# Patient Record
Sex: Male | Born: 1965
Health system: Southern US, Community
[De-identification: ages and names within clinical notes are randomized; demographics above are authoritative.]

## PROBLEM LIST (undated history)

## (undated) DIAGNOSIS — G4730 Sleep apnea, unspecified: Secondary | ICD-10-CM

## (undated) DIAGNOSIS — I509 Heart failure, unspecified: Secondary | ICD-10-CM

## (undated) DIAGNOSIS — I428 Other cardiomyopathies: Secondary | ICD-10-CM

## (undated) DIAGNOSIS — M549 Dorsalgia, unspecified: Secondary | ICD-10-CM

## (undated) DIAGNOSIS — E669 Obesity, unspecified: Secondary | ICD-10-CM

## (undated) DIAGNOSIS — I10 Essential (primary) hypertension: Secondary | ICD-10-CM

## (undated) DIAGNOSIS — G8929 Other chronic pain: Secondary | ICD-10-CM

## (undated) DIAGNOSIS — R0600 Dyspnea, unspecified: Secondary | ICD-10-CM

## (undated) DIAGNOSIS — I480 Paroxysmal atrial fibrillation: Secondary | ICD-10-CM

---

## 2003-07-23 ENCOUNTER — Encounter: Payer: Self-pay | Admitting: *Deleted

## 2003-07-23 ENCOUNTER — Ambulatory Visit (HOSPITAL_COMMUNITY): Admission: RE | Admit: 2003-07-23 | Discharge: 2003-07-23 | Payer: Self-pay | Admitting: *Deleted

## 2004-03-15 ENCOUNTER — Emergency Department (HOSPITAL_COMMUNITY): Admission: EM | Admit: 2004-03-15 | Discharge: 2004-03-15 | Payer: Self-pay | Admitting: Emergency Medicine

## 2004-12-14 ENCOUNTER — Emergency Department (HOSPITAL_COMMUNITY): Admission: EM | Admit: 2004-12-14 | Discharge: 2004-12-15 | Payer: Self-pay | Admitting: Emergency Medicine

## 2005-04-05 ENCOUNTER — Inpatient Hospital Stay (HOSPITAL_COMMUNITY): Admission: AD | Admit: 2005-04-05 | Discharge: 2005-04-06 | Payer: Self-pay | Admitting: *Deleted

## 2005-05-03 ENCOUNTER — Ambulatory Visit (HOSPITAL_BASED_OUTPATIENT_CLINIC_OR_DEPARTMENT_OTHER): Admission: RE | Admit: 2005-05-03 | Discharge: 2005-05-03 | Payer: Self-pay | Admitting: *Deleted

## 2005-05-03 ENCOUNTER — Ambulatory Visit: Payer: Self-pay | Admitting: Internal Medicine

## 2005-08-28 ENCOUNTER — Emergency Department (HOSPITAL_COMMUNITY): Admission: EM | Admit: 2005-08-28 | Discharge: 2005-08-29 | Payer: Self-pay | Admitting: Emergency Medicine

## 2005-11-06 ENCOUNTER — Emergency Department (HOSPITAL_COMMUNITY): Admission: EM | Admit: 2005-11-06 | Discharge: 2005-11-06 | Payer: Self-pay | Admitting: Emergency Medicine

## 2006-02-05 ENCOUNTER — Ambulatory Visit (HOSPITAL_COMMUNITY): Admission: RE | Admit: 2006-02-05 | Discharge: 2006-02-06 | Payer: Self-pay | Admitting: Orthopedic Surgery

## 2006-09-30 ENCOUNTER — Emergency Department (HOSPITAL_COMMUNITY): Admission: EM | Admit: 2006-09-30 | Discharge: 2006-10-01 | Payer: Self-pay | Admitting: Emergency Medicine

## 2006-11-19 HISTORY — PX: SHOULDER ARTHROSCOPY WITH LABRAL REPAIR: SHX5691

## 2007-05-22 ENCOUNTER — Emergency Department (HOSPITAL_COMMUNITY): Admission: EM | Admit: 2007-05-22 | Discharge: 2007-05-23 | Payer: Self-pay | Admitting: Emergency Medicine

## 2007-08-20 ENCOUNTER — Emergency Department (HOSPITAL_COMMUNITY): Admission: EM | Admit: 2007-08-20 | Discharge: 2007-08-20 | Payer: Self-pay | Admitting: Emergency Medicine

## 2009-01-21 ENCOUNTER — Inpatient Hospital Stay (HOSPITAL_COMMUNITY): Admission: EM | Admit: 2009-01-21 | Discharge: 2009-01-23 | Payer: Self-pay | Admitting: Family Medicine

## 2009-01-22 ENCOUNTER — Encounter (INDEPENDENT_AMBULATORY_CARE_PROVIDER_SITE_OTHER): Payer: Self-pay | Admitting: Internal Medicine

## 2009-08-18 ENCOUNTER — Inpatient Hospital Stay (HOSPITAL_COMMUNITY): Admission: EM | Admit: 2009-08-18 | Discharge: 2009-08-20 | Payer: Self-pay | Admitting: Emergency Medicine

## 2009-08-19 ENCOUNTER — Encounter (INDEPENDENT_AMBULATORY_CARE_PROVIDER_SITE_OTHER): Payer: Self-pay | Admitting: Internal Medicine

## 2009-09-12 ENCOUNTER — Ambulatory Visit: Payer: Self-pay | Admitting: *Deleted

## 2009-09-28 ENCOUNTER — Ambulatory Visit: Payer: Self-pay | Admitting: Internal Medicine

## 2010-02-16 ENCOUNTER — Ambulatory Visit: Payer: Self-pay | Admitting: Internal Medicine

## 2010-02-16 LAB — CONVERTED CEMR LAB
ALT: 27 units/L (ref 0–53)
Alkaline Phosphatase: 40 units/L (ref 39–117)
Basophils Absolute: 0 10*3/uL (ref 0.0–0.1)
Basophils Relative: 1 % (ref 0–1)
CO2: 23 meq/L (ref 19–32)
LDL Cholesterol: 107 mg/dL — ABNORMAL HIGH (ref 0–99)
Lymphocytes Relative: 27 % (ref 12–46)
MCHC: 33.5 g/dL (ref 30.0–36.0)
Monocytes Relative: 11 % (ref 3–12)
Neutro Abs: 3.5 10*3/uL (ref 1.7–7.7)
Neutrophils Relative %: 60 % (ref 43–77)
RBC: 5.04 M/uL (ref 4.22–5.81)
Sodium: 138 meq/L (ref 135–145)
Total Bilirubin: 0.7 mg/dL (ref 0.3–1.2)
Total Protein: 8 g/dL (ref 6.0–8.3)
VLDL: 57 mg/dL — ABNORMAL HIGH (ref 0–40)

## 2010-03-23 ENCOUNTER — Ambulatory Visit: Payer: Self-pay | Admitting: Internal Medicine

## 2011-02-22 LAB — LIPID PANEL
HDL: 42 mg/dL (ref >39)
Triglycerides: 164 mg/dL — ABNORMAL HIGH (ref <150)
VLDL: 33 mg/dL (ref 0–40)

## 2011-02-22 LAB — CBC
HCT: 33 % — ABNORMAL LOW (ref 39.0–52.0)
Hemoglobin: 11.4 g/dL — ABNORMAL LOW (ref 13.0–17.0)
MCHC: 33.6 g/dL (ref 30.0–36.0)
MCV: 81.4 fL (ref 78.0–100.0)
MCV: 82.1 fL (ref 78.0–100.0)
Platelets: 158 10*3/uL (ref 150–400)
Platelets: 165 10*3/uL (ref 150–400)
RBC: 4.05 MIL/uL — ABNORMAL LOW (ref 4.22–5.81)
WBC: 5.9 10*3/uL (ref 4.0–10.5)

## 2011-02-22 LAB — COMPREHENSIVE METABOLIC PANEL
AST: 18 U/L (ref 0–37)
Albumin: 3.6 g/dL (ref 3.5–5.2)
Calcium: 9.3 mg/dL (ref 8.4–10.5)
Creatinine, Ser: 1.23 mg/dL (ref 0.4–1.5)
GFR calc Af Amer: >60 mL/min (ref >60)
GFR calc non Af Amer: >60 mL/min (ref >60)

## 2011-02-22 LAB — CARDIAC PANEL(CRET KIN+CKTOT+MB+TROPI)
Relative Index: 1 (ref 0.0–2.5)
Total CK: 228 U/L (ref 7–232)
Troponin I: 0.02 ng/mL (ref 0.00–0.06)

## 2011-02-22 LAB — BASIC METABOLIC PANEL
BUN: 17 mg/dL (ref 6–23)
Chloride: 104 mEq/L (ref 96–112)
Potassium: 3.7 mEq/L (ref 3.5–5.1)
Sodium: 137 mEq/L (ref 135–145)

## 2011-02-22 LAB — BRAIN NATRIURETIC PEPTIDE: Pro B Natriuretic peptide (BNP): 154 pg/mL — ABNORMAL HIGH (ref 0.0–100.0)

## 2011-02-23 LAB — TROPONIN I: Troponin I: 0.03 ng/mL (ref 0.00–0.06)

## 2011-02-23 LAB — MAGNESIUM: Magnesium: 2.2 mg/dL (ref 1.5–2.5)

## 2011-02-23 LAB — URINE CULTURE

## 2011-02-23 LAB — RETICULOCYTES: Retic Ct Pct: 1.5 % (ref 0.4–3.1)

## 2011-02-23 LAB — T4, FREE: Free T4: 1.32 ng/dL (ref 0.80–1.80)

## 2011-02-23 LAB — CARDIAC PANEL(CRET KIN+CKTOT+MB+TROPI)
CK, MB: 2.4 ng/mL (ref 0.3–4.0)
Relative Index: 0.8 (ref 0.0–2.5)
Total CK: 289 U/L — ABNORMAL HIGH (ref 7–232)

## 2011-02-23 LAB — DIFFERENTIAL
Basophils Absolute: 0 10*3/uL (ref 0.0–0.1)
Lymphocytes Relative: 21 % (ref 12–46)
Neutro Abs: 5 10*3/uL (ref 1.7–7.7)

## 2011-02-23 LAB — CBC
Hemoglobin: 12.9 g/dL — ABNORMAL LOW (ref 13.0–17.0)
RBC: 4.63 MIL/uL (ref 4.22–5.81)

## 2011-02-23 LAB — HEMOGLOBIN A1C
Hgb A1c MFr Bld: 6.7 % — ABNORMAL HIGH (ref 4.6–6.1)
Mean Plasma Glucose: 146 mg/dL

## 2011-02-23 LAB — CK TOTAL AND CKMB (NOT AT ARMC)
CK, MB: 2.7 ng/mL (ref 0.3–4.0)
CK, MB: 3.4 ng/mL (ref 0.3–4.0)
Relative Index: 0.9 (ref 0.0–2.5)
Total CK: 303 U/L — ABNORMAL HIGH (ref 7–232)

## 2011-02-23 LAB — BASIC METABOLIC PANEL
CO2: 22 mEq/L (ref 19–32)
Calcium: 9.3 mg/dL (ref 8.4–10.5)
GFR calc Af Amer: >60 mL/min (ref >60)
GFR calc non Af Amer: >60 mL/min (ref >60)
Sodium: 137 mEq/L (ref 135–145)

## 2011-02-23 LAB — RAPID URINE DRUG SCREEN, HOSP PERFORMED: Cocaine: NOT DETECTED

## 2011-02-23 LAB — URINALYSIS, ROUTINE W REFLEX MICROSCOPIC
Glucose, UA: NEGATIVE mg/dL
Hgb urine dipstick: NEGATIVE
Ketones, ur: NEGATIVE mg/dL
Protein, ur: NEGATIVE mg/dL

## 2011-02-23 LAB — PROTIME-INR: Prothrombin Time: 13.5 seconds (ref 11.6–15.2)

## 2011-02-23 LAB — IRON AND TIBC: Saturation Ratios: 21 % (ref 20–55)

## 2011-02-23 LAB — VITAMIN B12: Vitamin B-12: 385 pg/mL (ref 211–911)

## 2011-03-01 LAB — CBC
HCT: 33.5 % — ABNORMAL LOW (ref 39.0–52.0)
Hemoglobin: 11.5 g/dL — ABNORMAL LOW (ref 13.0–17.0)
MCHC: 34.3 g/dL (ref 30.0–36.0)
MCV: 80.4 fL (ref 78.0–100.0)
RBC: 4.17 MIL/uL — ABNORMAL LOW (ref 4.22–5.81)
WBC: 5.9 10*3/uL (ref 4.0–10.5)

## 2011-03-01 LAB — BASIC METABOLIC PANEL
CO2: 24 mEq/L (ref 19–32)
Calcium: 9 mg/dL (ref 8.4–10.5)
Chloride: 107 mEq/L (ref 96–112)
GFR calc Af Amer: >60 mL/min (ref >60)
GFR calc Af Amer: >60 mL/min (ref >60)
GFR calc non Af Amer: >60 mL/min (ref >60)
Potassium: 4.6 mEq/L (ref 3.5–5.1)
Sodium: 138 mEq/L (ref 135–145)
Sodium: 139 mEq/L (ref 135–145)

## 2011-03-01 LAB — CK TOTAL AND CKMB (NOT AT ARMC): Total CK: 173 U/L (ref 7–232)

## 2011-03-01 LAB — DIFFERENTIAL
Basophils Relative: 0 % (ref 0–1)
Eosinophils Absolute: 0.1 10*3/uL (ref 0.0–0.7)
Lymphs Abs: 1 10*3/uL (ref 0.7–4.0)
Monocytes Absolute: 0.4 10*3/uL (ref 0.1–1.0)
Monocytes Relative: 6 % (ref 3–12)

## 2011-03-01 LAB — CARDIAC PANEL(CRET KIN+CKTOT+MB+TROPI)
CK, MB: 1.5 ng/mL (ref 0.3–4.0)
CK, MB: 1.8 ng/mL (ref 0.3–4.0)
Total CK: 164 U/L (ref 7–232)

## 2011-03-01 LAB — TSH: TSH: 2.171 u[IU]/mL (ref 0.350–4.500)

## 2011-03-01 LAB — FERRITIN: Ferritin: 172 ng/mL (ref 22–322)

## 2011-03-01 LAB — IRON AND TIBC
Saturation Ratios: 14 % — ABNORMAL LOW (ref 20–55)
TIBC: 339 ug/dL (ref 215–435)

## 2011-03-01 LAB — D-DIMER, QUANTITATIVE: D-Dimer, Quant: 0.4 ug/mL-FEU (ref 0.00–0.48)

## 2011-03-01 LAB — TROPONIN I: Troponin I: 0.02 ng/mL (ref 0.00–0.06)

## 2011-04-03 NOTE — Discharge Summary (Signed)
NAME:  Juan Snyder, BLANK NO.:  [id number]   MEDICAL RECORD NO.:  [id number]          PATIENT TYPE:  INP   LOCATION:  4709                         FACILITY:  [facility]   PHYSICIAN:  Eduard Clos, MDDATE OF BIRTH:  1966-06-05   DATE OF ADMISSION:  01/21/2009  DATE OF DISCHARGE:                               DISCHARGE SUMMARY   COURSE IN THE HOSPITAL:  A 45 year old male with known history of  hypertension, hyperlipidemia, nonischemic cardiomyopathy presents with  complaints of shortness of breath.  On admission, the patient had a  chest x-ray which showed features of pulmonary edema.  The patient also  was found to be very hypertensive with systolic BPs  in the 180s and  diastolic more than 120.  The patient was admitted to telemetry floor  started on antihypertensives and serial cardiac enzymes were followed.  EKG did show some nonspecific T-wave inversions.  Cardiac enzymes were  negative.  The patient underwent stress test through nuclear medicine  which showed no evidence of any pharmacological stress-induced ischemia.  EF was 41% with mild left ventricular enlargement with lateral wall  hypokinesis.   The patient had a cardiology consult with Dr. Elsie Lincoln and eventually  followed by Dr. Jacinto Halim.  The patient's blood pressure were medications  were adjusted and advised to follow up with [facility] in a week's time.  At this time, the patient's symptoms are  largely improved.  Blood pressure is reasonably controlled.  I have  advised the patient to strongly be compliant with his medications and to  recheck his BMET and LFT within a week's time with his primary care  physician.  At the time of this dictation, the patient is  hemodynamically stable.   PROCEDURES DURING THE STAY:  1. Stress Myoview on January 22, 2009, showed negative pharmacological      stress-induced ischemia.  Left ventricular ejection fraction 41%.      Mild left ventricular  enlargement with lateral wall hypokinesis.  2. Chest x-ray on January 21, 2009, showed acute pulmonary interstitial      edema, mild progression of cardiomegaly.   PERTINENT LABS:  On January 23, 2009, BUN and creatinine were 15 and 1.2,  troponins were all negative at 0.01.  BNP on January 22, 2009, was 443.   FINAL DIAGNOSES:  1. Acute pulmonary edema secondary to accelerated hypertension.  2. Decompensated congestive heart failure both systolic and diastolic,      acute on chronic secondary to uncontrolled hypertension.  3. Accelerated hypertension secondary to noncompliance with      medication.  4. Hyperlipidemia.  5. Chronic back pain.  6. Obstructive sleep apnea.  7. Obesity.   MEDICATIONS AT DISCHARGE:  1. Multivitamin 1 tablet p.o. daily.  2. Ambien 5 mg p.o. at bedtime as needed.  3. Aspirin 81 mg p.o. daily.  4. Vicodin 5/500 mg p.o. q.6 h. p.r.n. for pain.  5. Flexeril 10 mg p.o. t.i.d.  6. Potassium chloride 20 mEq p.o. daily.  7. Lasix 40 mg p.o. daily.  8. Coreg 6.25 mg p.o. b.i.d.  9. Amlodipine  10 mg p.o. daily.  10.Terazosin 2 mg p.o. b.i.d.  11.Lisinopril and hydrochlorothiazide 40/25 mg p.o. daily.  12.Pravachol 40 mg p.o. daily.   PLAN:  The patient advised to follow up with his primary care physician  within a week's time to recheck his BMET and LFT, to follow up with  [facility] and Dr. Lynnea Ferrier in a week's time.  The  patient is to be on a cardiac-healthy diet with fluid restriction of 1.5  L.  The patient is strongly advised to be compliant with his medication.  Activity as tolerated.      Eduard Clos, MD  Electronically Signed     ANK/MEDQ  D:  01/23/2009  T:  01/24/2009  Job:  [id number]

## 2011-04-03 NOTE — H&P (Signed)
NAME:  Juan Snyder, Juan Snyder NO.:  [id number]   MEDICAL RECORD NO.:  [id number]          PATIENT TYPE:  INP   LOCATION:  4709                         FACILITY:  [facility]   PHYSICIAN:  Massie Maroon, MD        DATE OF BIRTH:  07/08/1966   DATE OF ADMISSION:  01/21/2009  DATE OF DISCHARGE:                              HISTORY & PHYSICAL   CHIEF COMPLAINT:  Increasing shortness of breath.   This is a 45 year old male with history of nonischemic cardiomyopathy  (EF 40-45%), complains of increasing shortness of breath over the past  week.  He also complains of some chest pain when he was back to the  house to use the restroom.  He does not state at what time it began.  The chest pain was substernal and described as pressure without  radiation and lasted a few minutes and occurred about 4-5 days ago.  He  has noticed about 10 pounds of weight gain over the past week.  He  describes some orthopnea.  The patient denies any fever, chills, cough,  palpitations, nausea, vomiting, diaphoresis.  He does note some  increasing lower extremity edema over the past week as well.  He denies  any increased salt or poorly controlled hypertension.  The patient was  brought to [facility] and then apparently sent to the ED.  In the ED, chest x-ray showed acute pulmonary interstitial edema with  mild increase in cardiomegaly.  The patient will be admitted for  congestive heart failure as well as chest pain.   PAST MEDICAL HISTORY:  1. Hypertension.  2. Metabolic syndrome.  3. Nonischemic cardiomyopathy, no prior history of CHF.  4. Left heart cath on July 23, 2003, no significant CAD, EF 40-45%  5. Sleep apnea with history of CPAP titration on May 03, 2005, at 18      CWP.  6. Chronic back pain.   PAST SURGICAL HISTORY:  1. Right shoulder SLAP lesion and impingement.  2. Right shoulder arthroscopy with extensive intra-articular      debridement of torn superior labral tear.  3. Anterior/posterior as well as arthroscopic biceps tenotomy.  4. Arthroscopic subacromial decompression with coracoacromial ligament      release followed by many open biceps tenodesis in the groove using      the Arthrex Bio-Tenodesis screw.   ALLERGIES:  No known drug allergies.   SOCIAL HISTORY:  The patient does not smoke.  He drinks about 6 pack of  beer per week, 10 shots per week.   FAMILY HISTORY:  Mother is alive at age 20, complains of hypertension,  hyperlipidemia, right shoulder surgery.  Father has hypertension and is  lives at 47.  He has a history of colitis.   REVIEW OF SYSTEMS:  No history of COPD, no history of asthma, negative  for all 10-organ systems except for pertinent positives as stated above.   PHYSICAL EXAMINATION:  VITAL SIGNS:  Temperature 99.8, pulse 70, blood  pressure 174/121, pulse ox was 99%.  HEENT:  Anicteric, EOMI, no nystagmus, pupils 1.5 mm, symmetric, direct,  consensual, near reflexes intact.  NECK:  No JVD, no bruit, no thyromegaly.  HEART:  Regular rate and rhythm.  S1 and S2.  No murmurs, gallops, or  rubs.  LUNGS:  Clear to auscultation bilaterally (this is after the patient  received Lasix IV in the ED).  ABDOMEN:  Soft, nontender, nondistended.  Positive bowel sounds.  EXTREMITIES:  No cyanosis, clubbing, or edema.  NEURO:  Cranial nerves II through XII intact, reflexes 2+, symmetric,  diffuse with downgoing toes bilaterally, motor strength 5/5 in all 4  extremities, pinprick intact.  SKIN:  Positive keloid formation.  LYMPH NODES:  No adenopathy.   LABORATORY DATA:  BNP 461 (elevated), troponin I 0.02 (negative), D-  dimer negative.  Sodium 138, potassium 4.6, chloride 107, bicarb 24, BUN  12, creatinine 1.16, glucose 103.   WBC 5.9, hemoglobin 11.5, platelet count 184.   Chest x-ray showed acute pulmonary interstitial edema with mild  increasing cardiomegaly.  EKG was performed, but there is not a copy  with this papers.   One of the nurses is going to print it out.   MEDICATIONS:  1. Vicodin 5/500 mg q.6 h. p.r.n. back pain.  2. Enteric-coated aspirin 81 mg daily.  3. Multivitamin daily.  4. Ambien 5 mg 1-2 nightly.  5. Flexeril 10 mg t.i.d.  6. Potassium chloride 20 mEq 1 daily.  7. Furosemide 20 mg daily.  8. Carvedilol 12.5 mg b.i.d.  9. Clonidine 0.2 mg 1-1/2 b.i.d.  10.Amlodipine 10 mg daily.  11.Terazosin 2 mg 4 tablets nightly.   ASSESSMENT:  1. Congestive heart failure.  2. Chest pain.  3. Hypertension.  4. Sleep apnea.  5. Nonischemic cardiomyopathy (last left heart catheterization on      July 23, 2003, no significant coronary artery disease, ejection      fraction 40-45%).  6. History of right shoulder superior labrum anterior and posterior      lesion and impingement repair.   PLAN:  The patient will be placed on telemetry.  We will check troponin  I q.8 h. x3 sets.  We will add a lipid panel, TSH, iron studies to his  next set of labs.  We will obtain a cardiac echo and an adenosine and  Persantine Myoview to rule out ischemic heart disease.  The patient will  be placed on strict I's and O's, daily weights, and 1500 fluid  restriction for now.  We will use Lasix IV initially to treat his  congestive heart failure, and the patient will be placed on lisinopril  for afterload reduction.  We will decrease his carvedilol and cut it by  half.  The patient will be treated  with nitro paste.  He will remain on enteric-coated aspirin.  We will  also add Lipitor for now.  We will check a BNP in the a.m. to see if his  CHF is improved.  I believe that the CHF exacerbation is possibly  related to poorly controlled hypertension.  The patient will have to  monitor his blood pressure more closely at home.      Massie Maroon, MD  Electronically Signed     JYK/MEDQ  D:  01/22/2009  T:  01/23/2009  Job:  [id number]   cc:   Primary care physician  [facility] Dr.  Gigi Gin R. Ranell Patrick, M.D.

## 2011-04-03 NOTE — Consult Note (Signed)
NAME:  Juan Snyder, Juan Snyder NO.:  [id number]   MEDICAL RECORD NO.:  [id number]          PATIENT TYPE:  INP   LOCATION:  4709                         FACILITY:  [facility]   PHYSICIAN:  Madaline Savage, M.D.DATE OF BIRTH:  September 22, 1966   DATE OF CONSULTATION:  DATE OF DISCHARGE:                                 CONSULTATION   CHIEF COMPLAINT:  Shortness of breath and chest pain.   HISTORY OF PRESENT ILLNESS:  Mr. Shuler is a 45 year old male who was  followed by Dr. Jenne Campus is now followed by Dr. Lynnea Ferrier.  He has a  history of severe hypertension.  He was admitted by the hospitalist  service with shortness of breath.  He admits to having some chest  pressure a few days ago.  He has had problems affording his medication  and has been out of amlodipine.  We are asked to proceed with a Myoview.  He was seen while he was in nuclear medicine by Dr. Elsie Lincoln and myself.   PAST MEDICAL HISTORY:  Remarkable for hypertension as noted above.  He  had a catheterization in 2004 that showed mild coronary disease.  He  also had an EF of 40-45% at catheterization.  He has a history of sleep  apnea and is on CPAP.  He injured his back in 2008, and has chronic back  pain.   CURRENT MEDICATIONS:  Doses are unknown, he has run out of his  amlodipine.  He does take an aspirin, Coreg, clonidine, Lasix, potassium  and terazosin.   SOCIAL HISTORY:  He is a nonsmoker, he lives with his wife.   ALLERGIES:  NO KNOWN DRUG ALLERGIES.   FAMILY HISTORY:  Unremarkable for early coronary disease, his mother  does have hypertension.   REVIEW OF SYSTEMS:  Essentially unremarkable except for noted above.  He  does have some edema over the last few days.   PHYSICAL EXAMINATION:  VITAL SIGNS:  Blood pressure 168/115, pulse 73,  temperature 98.2, respirations 16.  GENERAL:  He is a morbidly obese African American male in no acute  distress.  HEENT:  Normocephalic.  He wears glasses.  NECK:  Without JVD  or bruit.  CHEST:  Reveals diminished breath sounds.  No obvious wheezing.  CARDIAC:  Reveals diminished heart sounds without an obvious murmur, rub  or gallop.  ABDOMEN:  Obese, nontender.  EXTREMITIES:  Reveal trace pretibial edema bilaterally.  NEURO:  Grossly intact.  He is awake, alert, oriented and cooperative.  He moves all extremities without obvious deficit.  SKIN:  Warm and dry.   EKG shows sinus rhythm with T-wave inversion in the lateral leads.  Sodium is 138, potassium 4.6, BUN 12, creatinine 1.16.  White count 5.9,  hemoglobin 11.5, hematocrit 33.5, platelets 184.  D-dimer 0.4, TSH 2.17.  BNP on admission was 461.  Chest x-ray shows acute interstitial edema  and mild progression of cardiomegaly.   IMPRESSION:  1. Acute pulmonary edema, acute on chronic, both systolic and      diastolic.  2. Hypertension, controlled.  3. Noncompliance secondary to finances.  4. Morbid obesity.  5. Sleep apnea, continuous positive airway pressure.  6. Mild left ventricular dysfunction in the past with an ejection      fraction of 40-45% by catheterization in 2004.  At that time, he      had no significant coronary disease and no evidence of renal artery      stenosis.  7. Chronic back pain after a back injury in 2008.   PLAN:  The patient was seen by Dr. Elsie Lincoln and myself while in nuclear  medicine.  We will proceed with an adenosine Myoview.  He does have  Lasix ordered IV and we will continue this.  His other medications have  been adjusted.      Abelino Derrick, P.A.    ______________________________  Madaline Savage, M.D.    Lenard Lance  D:  01/22/2009  T:  01/22/2009  Job:  [id number]

## 2011-04-06 NOTE — Op Note (Signed)
NAME:  Juan Snyder, Juan Snyder NO.:  [id number]   MEDICAL RECORD NO.:  [id number]          PATIENT TYPE:  OIB   LOCATION:  5023                         FACILITY:  [facility]   PHYSICIAN:  Almedia Balls. Ranell Patrick, M.D. DATE OF BIRTH:  February 19, 1966   DATE OF PROCEDURE:  02/05/2006  DATE OF DISCHARGE:  02/06/2006                                 OPERATIVE REPORT   PREOPERATIVE DIAGNOSIS:  Right shoulder pain, secondary to SLAP lesion and  impingement.   POSTOPERATIVE DIAGNOSIS:  Right shoulder pain, secondary to SLAP lesion and  impingement.   OPERATION PERFORMED:  Right shoulder arthroscopy with extensive intra-  articular debridement of torn superior labral tear anterior posterior as  well as arthroscopic biceps tenotomy.  Arthroscopic subacromial  decompression with CA ligament release followed by mini open biceps  tenodesis in the groove using Arthrex biotenodesis screw.   SURGEON:  Almedia Balls. Ranell Patrick, M.D.   ASSISTANT:  Donnie Coffin. Durwin Nora, P.A.   ANESTHESIA:  General.   ESTIMATED BLOOD LOSS:  Minimal.   FLUIDS REPLACED:  1200 mL crystalloid.   INSTRUMENT COUNT:  Correct.   COMPLICATIONS:  None.  Perioperative antibiotics given.   INDICATIONS FOR PROCEDURE:  The patient is a 45 year old male with history  of worsening right shoulder pain secondary to MRI proven labral tear.  Patient presents now for operative treatment having failed conservative  management.  Informed consent was obtained.   DESCRIPTION OF PROCEDURE:  After adequate level of anesthesia was achieved,  the patient was positioned in modified beach chair position.  All  neurovascular structures were padded appropriately.  The right shoulder was  sterilely prepped and draped in the usual manner.  We entered the shoulder  using standard arthroscopic portals, anterior, posterior and lateral portals  created in similar fashion with infiltration of the skin with 0.25% Marcaine  with epinephrine followed by incision  with the 11 blade scalpel and directed  the cannula into the joint using blunt obturators.  Diagnostic arthroscopy  revealed a torn superior labrum with an unstable biceps anchor.  This is an  extensively torn superior labrum with an unstable biceps anchor.  This is an  extensively torn degenerative type labral tear which is not amenable to  fixation.  We went ahead and performed a biceps tenotomy followed by an  aggressive debridement of the superior labrum.  This extended back to the  posterior approximately 9 o'clock position.  Paralabral cyst was identified  and debrided as well.  At this point, the articular cartilage was inspected.  There was no evidence of significant chondrolysis.  The rotator cuff was  normal.  Subscapularis was normal.  The inferior pouch was normal.  Scope  was placed in the subacromial space.  A thorough bursectomy and  acromioplasty was performed using a butcher block technique creating a type  1 acromial shape with CA ligament release.  The rotator cuff was pristine  from the bursal side and we did not enter the [facility] joint inferiorly.  At this  point we concluded the arthroscopy.  We performed a small longitudinal skin  incision directly overlying the  bicipital groove anterior to the shoulder  and this was about a 1 inch incision.  Dissection carried sharply down  through subcutaneous tissues.  Deltoid was split in line with its fibers  exposing the bicipital groove, incised the soft tissue over the top of that,  delivered the biceps tendon and tenodesed it.  With the elbow at 90 degrees,  the biceps in midtension, using a 7 x 22 mm Arthrex biotenodesis screw.  Went ahead and closed the deltoid to itself with 0 Vicryl suture after  irrigation followed by 20 Vicryl subcutaneous and 4-0 Monocryl skin closure.  Sterile dressing was applied followed by a shoulder sling.  Patient taken to  recovery room having tolerated surgery well.            ______________________________  Almedia Balls. Ranell Patrick, M.D.     SRN/MEDQ  D:  02/05/2006  T:  02/06/2006  Job:  [id number]

## 2011-04-06 NOTE — Discharge Summary (Signed)
NAME:  Juan Snyder, Juan Snyder NO.:  [id number]   MEDICAL RECORD NO.:  [id number]          PATIENT TYPE:  INP   LOCATION:  3705                         FACILITY:  [facility]   PHYSICIAN:  Darlin Priestly, MD  DATE OF BIRTH:  08/31/1966   DATE OF ADMISSION:  04/05/2005  DATE OF DISCHARGE:  04/06/2005                                 DISCHARGE SUMMARY   DISCHARGE DIAGNOSES:  1.  Severe hypertension, improving.  2.  Noncompliance secondary to finances.  3.  Metabolic syndrome.  4.  Hypertensive heart disease.  5.  Nonischemic cardiomyopathy with an ejection fraction of 40% to 45%.  6.  Normal coronary arteries and normal renal arteries.   DISCHARGE CONDITION:  Improving.   PROCEDURE:  None.   DISCHARGE MEDICATIONS:  1.  Clonidine 0.2 mg one b.i.d.  2.  Lisinopril 40/25 half b.i.d.  3.  Labetalol 100 mg b.i.d.  4.  Enteric-coated aspirin 81 mg daily.   DISCHARGE INSTRUCTIONS:  Low salt, low sugar diet.   FOLLOWUP:  Dr. Jenne Campus on Apr 11, 2005, at 3 p.m.   HISTORY OF PRESENT ILLNESS:  A 45 year old African American male patient of  Dr. Lenise Herald from cardiology with history of normal course, no renal  artery stenosis, and a history of hypertension, had been having issues with  his medications secondary to money and not feeling well on the medications.  He had presented to the office on Apr 05, 2005, to fill out family leave  forms for frequent blood pressure checks and on that visit blood pressure  was found to be 220/150 and 218/148.  He had not taken his clonidine or  lisinopril. His pressure did come back in the office to 180/130 and 184/124,  but due to severity of his blood pressure was felt by Dr. Jenne Campus to be the  safest action to admit the patient to the hospital and further treat his  hypertension.   OUTPATIENT MEDICATIONS:  1.  Aspirin 81 mg daily.  2.  Clonidine 0.2 b.i.d.  3.  Lisinopril/HCTZ 10/25 half daily.   ALLERGIES:  No known drug  allergies.   PAST MEDICAL HISTORY:  Hypertension, normal coronaries, normal renals,  nonischemic cardiomyopathy, EF of 40% to 45%, and hypertensive heart  disease.   FAMILY HISTORY/SOCIAL HISTORY/AND REVIEW OF SYSTEMS:  See H&P.   On physical exam at discharge, blood pressure 195/116, pulse 69,  respirations 18, temperature 98.7, oxygen saturation 96% on room air. Heart  was S1 and S2, regular rate and rhythm. Lungs were clear. Abdomen was soft,  positive bowel sounds. Extremities without edema.   LABORATORY DATA:  Hemoglobin 14, hematocrit 42, WBC 5.4, chloride 228,  sodium 140, potassium 3.9, chloride 103, CO2 29, glucose 161, BUN 16,  creatinine 1.4, calcium 9.8, total protein 7.4, albumin 4.0, AST 26, ALT 24,  alkaline phosphatase 253, total bilirubin 1.1. Glycohemoglobin did not have  enough blood to measure.   TSH was 0.708.   HOSPITAL COURSE:  Mr. Lard was admitted by Dr. Jenne Campus due to accelerated  hypertension. He was brought into [facility] on telemetry. IV labetalol  was written as well as p.o. labetalol and he is to be monitored. By the next  morning the patient's blood pressure continued to be elevated, though  improved. Glucose was elevated. Glycohemoglobin had been ordered and results  pending at discharge. Dr. Clarene Duke saw the patient, adjusted his medications,  and he will follow up as an outpatient with Dr. Jenne Campus. He is ready for  discharge home.       LRI/MEDQ  D:  06/04/2005  T:  06/04/2005  Job:  [id number]   cc:   Gabriel Earing, M.D.  [address]  [place]  [place] [address]  Fax: [phone]

## 2011-04-06 NOTE — Procedures (Signed)
NAME:  Juan Snyder, DROST NO.:  [id number]   MEDICAL RECORD NO.:  [id number]          PATIENT TYPE:  OUT   LOCATION:  SLEEP CENTER                 FACILITY:  [facility]   PHYSICIAN:  Clinton D. Maple Hudson, M.D. DATE OF BIRTH:  Jun 18, 1966   DATE OF STUDY:  05/03/2005                              NOCTURNAL POLYSOMNOGRAM   REFERRING PHYSICIAN:  Dr. Lenise Herald.   DATE OF STUDY:  May 03, 2005   INDICATION FOR THE STUDY:  Hypersomnia with sleep apnea. Epworth sleepiness  score 14/24. BMI 36. Weight 242 pounds. A diagnostic NPSG elsewhere on May 25, 2004 reported an RDI of 6 per hour. CPAP titration is requested.   SLEEP ARCHITECTURE:  Total sleep time 383 minutes with sleep efficiency 96%.  Stage 1 3%, stage 2 57%, stages 3 and 4 1%, REM 38% of total sleep time.  Sleep latency 2 minutes, REM latency 65 minutes, awake after sleep onset 15  minutes, arousal index 10. No bedtime medication taken.   RESPIRATORY DATA:  CPAP titration protocol. CPAP was titrated to 18 CWP, RDI  5.7 per hour, using a large ResMed Ultra Mirage full-face mask with heated  humidifier and well tolerated.   OXYGEN DATA:  Loud snoring was largely suppressed by CPAP. Mean oxygen  saturation through the study was 94% on room air but continued desaturation  was noted on CPAP ranging 88-94% on room air. Question underlying  cardiopulmonary disease.   CARDIAC DATA:  Normal sinus rhythm, sinus bradycardia ranging 55-68 beats  per minute.   MOVEMENT/PARASOMNIA:  Occasional leg jerk, insignificant. No bathroom trips.   IMPRESSION/RECOMMENDATION:  1.  Successful CPAP titration to 18 CWP, respiratory disturbance index 5.7      per hour, using a large ResMed Ultra Mirage full-face mask with heated      humidifier.  2.  Residual mild hypoxemia 88-94% on CPAP, raising question of room air      oxygen saturation while awake, presence of cardiopulmonary disease, and      potential need to add low-flow oxygen to  CPAP. This can be assessed with      overnight oximetry while on CPAP at home.  3.  Baseline diagnostic nocturnal polysomnogram on May 25, 2004 that      recorded a respiratory disturbance index at 60 per hour.      Clinton D. Maple Hudson, M.D.  Diplomat    CDY/MEDQ  D:  05/06/2005 11:56:58  T:  05/07/2005 15:41:30  Job:  [id number]

## 2011-04-06 NOTE — Cardiovascular Report (Signed)
NAME:  Juan Snyder, GUIA NO.:  [id number]   MEDICAL RECORD NO.:  [id number]                   PATIENT TYPE:  OIB   LOCATION:  2899                                 FACILITY:  [facility]   PHYSICIAN:  Darlin Priestly, M.D.             DATE OF BIRTH:  Jul 12, 1966   DATE OF PROCEDURE:  07/23/2003  DATE OF DISCHARGE:                              CARDIAC CATHETERIZATION   PROCEDURES PERFORMED:  1. Left heart catheterization.  2. Coronary angiography.  3. Left ventriculogram.  4. Abdominal aortogram.   CARDIOLOGIST:  Darlin Priestly, M.D.   COMPLICATIONS:  None.   INDICATIONS:  Mr. Stueve is a 45 year old male, patient of Dr. Andi Devon and  Dr. Lenise Herald with a history of hyperlipidemia and hypertension who was  seen in the office for chest pain and diffuse T wave inversions.  He is now  brought for cardiac catheterization to rule out CAD.   DESCRIPTION OF PROCEDURE:  After getting informed written consent the  patient was brought to the cardiac cath lab where the right and left groins  were shaved, prepped and draped in the usual sterile fashion.  ECG  monitoring was established.  Using the modified Seldinger technique a #6  French arterial sheath was inserted into the right femoral artery.  Six  French diagnostic catheter was used to perform diagnostic angiography.  This  revealed a large left main with no significant disease.   The LAD is a large vessel, which courses the apex goes into a diagonal  branch.  The LAD has no significant disease.  The first diagonal is a medium-  sized vessel with no significant disease.   The left coronary artery gives rise to a large ramus intermedius with no  significant disease.   The left circumflex is a large vessel, which is dominant.  It gives rise to  one obtuse marginal  as well as the PDA.  The AV groove circumflex is free  of significant disease.  The first OM is a large vessel with no significant  disease.  He PDA is a large vessel which bifurcates distally and has no  significant disease.   The right coronary artery is a medium-sized vessel, which is nondominant.   Left ventriculogram reveals mildly depressed EF of 40-45% with global  hypokinesis.   Abdominal aortogram reveals no significant renal artery stenosis.   HEMODYNAMIC SYSTEM:  Systemic arterial pressure 155/103.  LV systemic  pressure 165/28.  LVEDP of 18.   Following completion of the case the right femoral site was then closed used  Perclose without complication.  One gram of Ancef was given  prophylactically.    CONCLUSION:  1. No significant coronary artery disease.  2. Mildly depressed left ventricular systolic function.  3. No evidence of renal artery stenosis.  4. Systemic hypertension.  Darlin Priestly, M.D.    RHM/MEDQ  D:  07/23/2003  T:  07/23/2003  Job:  [id number]   cc:   Gabriel Earing, M.D.  [address]  [place]  [place] [address]  Fax: [phone]

## 2011-04-19 ENCOUNTER — Emergency Department (HOSPITAL_COMMUNITY): Payer: Medicare Other

## 2011-04-19 ENCOUNTER — Emergency Department (HOSPITAL_COMMUNITY)
Admission: EM | Admit: 2011-04-19 | Discharge: 2011-04-19 | Disposition: A | Discharge status: Home / Self Care | Payer: Medicare Other | Attending: Emergency Medicine | Admitting: Emergency Medicine

## 2011-04-19 ENCOUNTER — Inpatient Hospital Stay (INDEPENDENT_AMBULATORY_CARE_PROVIDER_SITE_OTHER)
Admission: RE | Admit: 2011-04-19 | Discharge: 2011-04-19 | Disposition: A | Discharge status: Home / Self Care | Payer: Medicare Other | Source: Ambulatory Visit | Attending: Family Medicine | Admitting: Family Medicine

## 2011-04-19 DIAGNOSIS — R609 Edema, unspecified: Secondary | ICD-10-CM | POA: Insufficient documentation

## 2011-04-19 DIAGNOSIS — I10 Essential (primary) hypertension: Secondary | ICD-10-CM | POA: Insufficient documentation

## 2011-04-19 DIAGNOSIS — Z79899 Other long term (current) drug therapy: Secondary | ICD-10-CM | POA: Insufficient documentation

## 2011-04-19 DIAGNOSIS — M549 Dorsalgia, unspecified: Secondary | ICD-10-CM | POA: Insufficient documentation

## 2011-04-19 DIAGNOSIS — G8929 Other chronic pain: Secondary | ICD-10-CM | POA: Insufficient documentation

## 2011-04-19 DIAGNOSIS — R079 Chest pain, unspecified: Secondary | ICD-10-CM | POA: Insufficient documentation

## 2011-04-19 DIAGNOSIS — R071 Chest pain on breathing: Secondary | ICD-10-CM

## 2011-08-30 LAB — I-STAT 8, (EC8 V) (CONVERTED LAB)
Acid-Base Excess: 2
Hemoglobin: 15
Potassium: 3.8
Sodium: 139
TCO2: 29
pH, Ven: 7.392 — ABNORMAL HIGH

## 2011-08-30 LAB — CBC
Hemoglobin: 13
RBC: 4.96

## 2011-08-30 LAB — POCT CARDIAC MARKERS
CKMB, poc: 2.9
Myoglobin, poc: 97
Troponin i, poc: <0.05
Troponin i, poc: <0.05

## 2011-08-30 LAB — DIFFERENTIAL
Lymphocytes Relative: 18
Monocytes Absolute: 0.6
Monocytes Relative: 8
Neutro Abs: 5.5

## 2011-09-04 LAB — URINALYSIS, ROUTINE W REFLEX MICROSCOPIC
Glucose, UA: NEGATIVE
Nitrite: NEGATIVE
Protein, ur: NEGATIVE
Urobilinogen, UA: 1

## 2012-04-24 ENCOUNTER — Encounter (HOSPITAL_COMMUNITY): Payer: Self-pay | Admitting: Emergency Medicine

## 2012-04-24 ENCOUNTER — Emergency Department (HOSPITAL_COMMUNITY)
Admission: EM | Admit: 2012-04-24 | Discharge: 2012-04-25 | Disposition: A | Discharge status: Home / Self Care | Payer: Self-pay | Attending: Emergency Medicine | Admitting: Emergency Medicine

## 2012-04-24 DIAGNOSIS — E119 Type 2 diabetes mellitus without complications: Secondary | ICD-10-CM | POA: Insufficient documentation

## 2012-04-24 DIAGNOSIS — I10 Essential (primary) hypertension: Secondary | ICD-10-CM | POA: Insufficient documentation

## 2012-04-24 DIAGNOSIS — R0602 Shortness of breath: Secondary | ICD-10-CM | POA: Insufficient documentation

## 2012-04-24 DIAGNOSIS — R079 Chest pain, unspecified: Secondary | ICD-10-CM | POA: Insufficient documentation

## 2012-04-24 HISTORY — DX: Obesity, unspecified: E66.9

## 2012-04-24 HISTORY — DX: Dorsalgia, unspecified: M54.9

## 2012-04-24 HISTORY — DX: Essential (primary) hypertension: I10

## 2012-04-24 HISTORY — DX: Other chronic pain: G89.29

## 2012-04-24 HISTORY — DX: Heart failure, unspecified: I50.9

## 2012-04-24 NOTE — ED Notes (Signed)
PT. REPORTS SUBSTERNAL CHEST PAIN ONSET THIS EVENING WITH SOB , OCCASIONAL DRY COUGH AND NAUSEA .

## 2012-04-25 ENCOUNTER — Emergency Department (HOSPITAL_COMMUNITY): Payer: Self-pay

## 2012-04-25 LAB — CBC
HCT: 38.9 % — ABNORMAL LOW (ref 39.0–52.0)
Platelets: 216 10*3/uL (ref 150–400)
RDW: 13.8 % (ref 11.5–15.5)
WBC: 7.1 10*3/uL (ref 4.0–10.5)

## 2012-04-25 LAB — BASIC METABOLIC PANEL
BUN: 13 mg/dL (ref 6–23)
GFR calc non Af Amer: >90 mL/min (ref >90)
Glucose, Bld: 208 mg/dL — ABNORMAL HIGH (ref 70–99)
Potassium: 3.8 mEq/L (ref 3.5–5.1)

## 2012-04-25 MED ORDER — OMEPRAZOLE 20 MG PO CPDR: 20.0000 mg | DELAYED_RELEASE_CAPSULE | Freq: Every day | ORAL | Status: DC

## 2012-04-25 MED ORDER — GI COCKTAIL ~~LOC~~
30.0000 mL | Freq: Once | ORAL | Status: AC
  Administered 2012-04-25: 30 mL via ORAL
  Filled 2012-04-25: qty 30

## 2012-04-25 NOTE — ED Provider Notes (Signed)
History     CSN: [id number]  Arrival date & time 04/24/12  2353   First MD Initiated Contact with Patient 04/25/12 0002      Chief Complaint  Patient presents with  . Chest Pain     The history is provided by the patient and medical records.   the patient reports he was watching a basketball game this evening and had 3-4 drinks on a piece of pizza.  Slightly later in the evening he developed severe substernal chest discomfort without radiation.  He reported nausea without diaphoresis.  He denied radiation of the pain.  He reports at one point it made him slightly short of breath.  The patient has a history of hypertension diabetes and diastolic congestive heart failure.  He had a cardiac catheterization in 2004 that demonstrated  nonobstructive coronary artery disease.  The patient did not try any medications at home for his discomfort.  He denies recent exertional chest pain or shortness of breath.  Without treatment his symptoms of illness never resolved at this time.  He reports a severe discomfort lasted for about 20-30 minutes.  Pain at this time is only very minimal.  It's described as a ache and burning in his chest  Past Medical History  Diagnosis Date  . Obesity   . Hypertension   . Diabetes mellitus   . CHF (congestive heart failure)   . Back pain, chronic     History reviewed. No pertinent past surgical history.  No family history on file.  History  Substance Use Topics  . Smoking status: Never Smoker   . Smokeless tobacco: Not on file  . Alcohol Use: Yes      Review of Systems  All other systems reviewed and are negative.    Allergies  Review of patient's allergies indicates no known allergies.  Home Medications  No current outpatient prescriptions on file.  BP 128/77  Pulse 90  Temp(Src) 98.3 F (36.8 C) (Oral)  Resp 20  SpO2 98%  Physical Exam  Nursing note and vitals reviewed. Constitutional: He is oriented to person, place, and time. He  appears well-developed and well-nourished.  HENT:  Head: Normocephalic and atraumatic.  Eyes: EOM are normal.  Neck: Normal range of motion.  Cardiovascular: Normal rate, regular rhythm, normal heart sounds and intact distal pulses.   Pulmonary/Chest: Effort normal and breath sounds normal. No respiratory distress.  Abdominal: Soft. He exhibits no distension. There is no tenderness.  Musculoskeletal: Normal range of motion.  Neurological: He is alert and oriented to person, place, and time.  Skin: Skin is warm and dry.  Psychiatric: He has a normal mood and affect. Judgment normal.    ED Course  Procedures    Date: 04/25/2012  Rate: 91  Rhythm: normal sinus rhythm  QRS Axis: normal  Intervals: normal  ST/T Wave abnormalities: Inferior lateral T wave inversions  Conduction Disutrbances: none  Narrative Interpretation:   Old EKG Reviewed: No significant changes noted as compared to prior EKG     DATE OF PROCEDURE: 07/23/2003  CARDIAC CATHETERIZATION  PROCEDURES PERFORMED:  1. Left heart catheterization.  2. Coronary angiography.   DESCRIPTION OF PROCEDURE: After getting informed written consent the  patient was brought to the cardiac cath lab where the right and left groins  were shaved, prepped and draped in the usual sterile fashion. ECG  monitoring was established. Using the modified Seldinger technique a #6  French arterial sheath was inserted into the right femoral artery. Six  French diagnostic catheter was used to perform diagnostic angiography. This  revealed a large left main with no significant disease.  The LAD is a large vessel, which courses the apex goes into a diagonal  branch. The LAD has no significant disease. The first diagonal is a medium-  sized vessel with no significant disease.  The left coronary artery gives rise to a large ramus intermedius with no  significant disease.  The left circumflex is a large vessel, which is dominant. It gives rise to    one obtuse marginal as well as the PDA. The AV groove circumflex is free  of significant disease. The first OM is a large vessel with no significant  disease. He PDA is a large vessel which bifurcates distally and has no  significant disease.  The right coronary artery is a medium-sized vessel, which is nondominant.  Left ventriculogram reveals mildly depressed EF of 40-45% with global  hypokinesis.  Abdominal aortogram reveals no significant renal artery stenosis.  HEMODYNAMIC SYSTEM: Systemic arterial pressure 155/103. LV systemic  pressure 165/28. LVEDP of 18.  Following completion of the case the right femoral site was then closed used  Perclose without complication. One gram of Ancef was given  prophylactically.  CONCLUSION:  1. No significant coronary artery disease.  2. Mildly depressed left ventricular systolic function.  3. No evidence of renal artery stenosis.  4. Systemic hypertension.   Labs Reviewed  CBC - Abnormal; Notable for the following:    HCT 38.9 (*)    All other components within normal limits  BASIC METABOLIC PANEL - Abnormal; Notable for the following:    Glucose, Bld 208 (*)    All other components within normal limits  PRO B NATRIURETIC PEPTIDE - Abnormal; Notable for the following:    Pro B Natriuretic peptide (BNP) 183.7 (*)    All other components within normal limits  TROPONIN I   Dg Chest 2 View  04/25/2012  *RADIOLOGY REPORT*  Clinical Data: Chest pain and shortness of breath.  CHEST - 2 VIEW  Comparison: 04/19/2011.  Findings: The heart is borderline enlarged but stable.  The mediastinal and hilar contours are unchanged.  Mild chronic bronchitic type lung changes but no infiltrates, edema or effusions.  IMPRESSION: Stable borderline cardiac enlargement. Mild bronchitic changes but no infiltrates or effusions.  Original Report Authenticated By: P. Loralie Champagne, M.D.     1. Chest pain       MDM  My suspicion that this is ACS is very low.   This occurred after pizza and beer nothing is more likely heartburn.  His EKG is unchanged from his prior.  Troponin is pending.  The patient is with only minimal discomfort at this time.  He reports is much better.  GI cocktail.        Lyanne Co, MD 04/25/12 0200

## 2015-07-22 ENCOUNTER — Ambulatory Visit (INDEPENDENT_AMBULATORY_CARE_PROVIDER_SITE_OTHER): Payer: Medicare Other | Admitting: Emergency Medicine

## 2015-07-22 VITALS — BP 132/72 | HR 94 | Temp 98.5°F | Resp 16 | Ht 68.0 in | Wt 302.0 lb

## 2015-07-22 DIAGNOSIS — E119 Type 2 diabetes mellitus without complications: Secondary | ICD-10-CM

## 2015-07-22 DIAGNOSIS — L600 Ingrowing nail: Secondary | ICD-10-CM | POA: Diagnosis not present

## 2015-07-22 DIAGNOSIS — G894 Chronic pain syndrome: Secondary | ICD-10-CM | POA: Insufficient documentation

## 2015-07-22 DIAGNOSIS — I10 Essential (primary) hypertension: Secondary | ICD-10-CM | POA: Insufficient documentation

## 2015-07-22 MED ORDER — SULFAMETHOXAZOLE-TRIMETHOPRIM 800-160 MG PO TABS: 1.0000 | ORAL_TABLET | Freq: Two times a day (BID) | ORAL | Status: DC

## 2015-07-22 MED ORDER — HYDROCODONE-ACETAMINOPHEN 10-325 MG PO TABS: 1.0000 | ORAL_TABLET | Freq: Three times a day (TID) | ORAL | Status: DC | PRN

## 2015-07-22 NOTE — Addendum Note (Signed)
Addended by: Johnnette Litter on: 07/22/2015 12:11 PM   Modules accepted: Orders

## 2015-07-22 NOTE — Patient Instructions (Addendum)
Infected Ingrown Toenail An infected ingrown toenail occurs when the nail edge grows into the skin and bacteria invade the area. Symptoms include pain, tenderness, swelling, and pus drainage from the edge of the nail. Poorly fitting shoes, minor injuries, and improper cutting of the toenail may also contribute to the problem. You should cut your toenails squarely instead of rounding the edges. Do not cut them too short. Avoid tight or pointed toe shoes. Sometimes the ingrown portion of the nail must be removed. If your toenail is removed, it can take 3-4 months for it to re-grow. HOME CARE INSTRUCTIONS   Soak your infected toe in warm water for 20-30 minutes, 2 to 3 times a day.  Packing or dressings applied to the area should be changed daily.  Take medicine as directed and finish them.  Reduce activities and keep your foot elevated when able to reduce swelling and discomfort. Do this until the infection gets better.  Wear sandals or go barefoot as much as possible while the infected area is sensitive.  See your caregiver for follow-up care in 2-3 days if the infection is not better. SEEK MEDICAL CARE IF:  Your toe is becoming more red, swollen or painful. MAKE SURE YOU:   Understand these instructions.  Will watch your condition.  Will get help right away if you are not doing well or get worse. Document Released: 12/13/2004 Document Revised: 01/28/2012 Document Reviewed: 11/01/2008 [facility] Patient Information 2015 [place], [place]. This information is not intended to replace advice given to you by your health care provider. Make sure you discuss any questions you have with your health care provider.   Toenail Removal Toenails may need to be removed because of injury, infections, or to correct abnormal growth. A special non-stick bandage will likely be put tightly on your toe to prevent bleeding. Often times a new nail will grow back. Sometimes the new nail may be deformed. Most of the  time when a nail is lost, it will gradually heal, but may be sensitive for a long time. HOME CARE INSTRUCTIONS   Keep your foot elevated to relieve pain and swelling. This will require lying in bed or on a couch with the leg on pillows or sitting in a recliner with the leg up. Walking or letting your leg dangle may increase swelling, slow healing, and cause throbbing pain.  Keep your bandage dry and clean.  Change your bandage in 24 hours.  After your bandage is changed, soak your foot in warm, soapy water for 10 to 20 minutes. Do this 3 times per day. This helps reduce pain and swelling. After soaking your foot apply a clean, dry bandage. Change your bandage if it is wet or dirty.  Only take over-the-counter or prescription medicines for pain, discomfort, or fever as directed by your caregiver.  See your caregiver as needed for problems. You might need a tetanus shot now if:  You have no idea when you had the last one.  You have never had a tetanus shot before.  The injured area had dirt in it. If you need a tetanus shot, and you decide not to get one, there is a rare chance of getting tetanus. Sickness from tetanus can be serious. If you did get a tetanus shot, your arm may swell, get red and warm to the touch at the shot site. This is common and not a problem. SEEK IMMEDIATE MEDICAL CARE IF:   You have increased pain, swelling, redness, warmth, drainage, or bleeding.  You have a fever. °· You have swelling that spreads from your toe into your foot. °Document Released: 08/04/2003 Document Revised: 01/28/2012 Document Reviewed: 11/15/2008 °[employer]® Patient Information ©2015 [employer]. This information is not intended to replace advice given to you by your health care provider. Make sure you discuss any questions you have with your health care provider. ° °

## 2015-07-22 NOTE — Progress Notes (Signed)
PROCEDURE NOTE: Ingrown Toenail Removal  Verbal Consent Obtained. Right great toe nail wiped with alcohol prep pad, then digital block with 4cc of 2% Xylocaine with 6cc of 0.5% Marcaine. Sterile prep and drape. Medial right great toe nail lifted and excised, lateral aspect of toe nail left intact. Proximal aspect of nail bed explored revealing no nail remnants. Ingrown tissue debrided. No active bleeding. Xeroform dressing applied. Cleansed and dressed. Wound care instructions including precautions reviewed with patient.

## 2015-07-22 NOTE — Progress Notes (Signed)
Subjective:  Patient ID: Juan Snyder, male    DOB: Jun 29, 1966  Age: 49 y.o. MRN: [id number]  CC: Back Pain and Foot Pain   HPI Juan Snyder presents  with pain in his left great toe. He has long-term history of pain in his left great toe unable to walk or wear shoes for the last week. He has no fever or chills. No purulent discharge. He has chronic low back pain for which she is treated with Vicodin by his family doctor due to the holiday this weekend is been unable to see his doctor and get a refill on his medication. He has no radicular symptoms or radiation of pain  History Juan Snyder has a past medical history of Obesity; Hypertension; Diabetes mellitus; CHF (congestive heart failure); and Back pain, chronic.   He has no past surgical history on file.   His  family history includes Hypertension in his mother.  He   reports that he has never smoked. He does not have any smokeless tobacco history on file. He reports that he drinks alcohol. He reports that he does not use illicit drugs.  Outpatient Prescriptions Prior to Visit  Medication Sig Dispense Refill  . amLODipine (NORVASC) 10 MG tablet Take 10 mg by mouth daily.    . carvedilol (COREG) 25 MG tablet Take 25 mg by mouth 2 (two) times daily with a meal.    . furosemide (LASIX) 40 MG tablet Take 40 mg by mouth daily.    . metFORMIN (GLUCOPHAGE) 500 MG tablet Take 500 mg by mouth daily with breakfast.    . Tamsulosin HCl (FLOMAX) 0.4 MG CAPS Take 0.4 mg by mouth daily.    Marland Kitchen terazosin (HYTRIN) 5 MG capsule Take 5 mg by mouth daily.    Marland Kitchen triamterene-hydrochlorothiazide (MAXZIDE-25) 37.5-25 MG per tablet Take 1 tablet by mouth daily.    Marland Kitchen HYDROcodone-acetaminophen (NORCO) 10-325 MG per tablet Take 1 tablet by mouth 2 (two) times daily as needed. For pain    . loratadine (CLARITIN) 10 MG tablet Take 10 mg by mouth daily.    Marland Kitchen omeprazole (PRILOSEC) 20 MG capsule Take 1 capsule (20 mg total) by mouth daily. 30 capsule 0   No  facility-administered medications prior to visit.    Social History   Social History  . Marital Status: Married    Spouse Name: N/A  . Number of Children: N/A  . Years of Education: N/A   Social History Main Topics  . Smoking status: Never Smoker   . Smokeless tobacco: None  . Alcohol Use: Yes  . Drug Use: No  . Sexual Activity: Not Asked   Other Topics Concern  . None   Social History Narrative     Review of Systems  Constitutional: Negative for fever, chills and appetite change.  HENT: Negative for congestion, ear pain, postnasal drip, sinus pressure and sore throat.   Eyes: Negative for pain and redness.  Respiratory: Negative for cough, shortness of breath and wheezing.   Cardiovascular: Negative for leg swelling.  Gastrointestinal: Negative for nausea, vomiting, abdominal pain, diarrhea, constipation and blood in stool.  Endocrine: Negative for polyuria.  Genitourinary: Negative for dysuria, urgency, frequency and flank pain.  Musculoskeletal: Positive for back pain. Negative for gait problem.  Skin: Negative for rash.  Neurological: Negative for weakness and headaches.  Psychiatric/Behavioral: Negative for confusion and decreased concentration. The patient is not nervous/anxious.     Objective:  BP 132/72 mmHg  Pulse 94  Temp(Src) 98.5  F (36.9 C) (Oral)  Resp 16  Ht 5\' 8"  (1.727 m)  Wt 302 lb (136.986 kg)  BMI 45.93 kg/m2  SpO2 98%  Physical Exam  Constitutional: He is oriented to person, place, and time. He appears well-developed and well-nourished. No distress.  HENT:  Head: Normocephalic and atraumatic.  Right Ear: External ear normal.  Left Ear: External ear normal.  Nose: Nose normal.  Eyes: Conjunctivae and EOM are normal. Pupils are equal, round, and reactive to light. No scleral icterus.  Neck: Normal range of motion. Neck supple. No tracheal deviation present.  Cardiovascular: Normal rate, regular rhythm and normal heart sounds.     Pulmonary/Chest: Effort normal. No respiratory distress. He has no wheezes. He has no rales.  Abdominal: He exhibits no mass. There is no tenderness. There is no rebound and no guarding.  Musculoskeletal: He exhibits no edema.  Lymphadenopathy:    He has no cervical adenopathy.  Neurological: He is alert and oriented to person, place, and time. Coordination normal.  Skin: Skin is warm and dry. No rash noted.  Psychiatric: He has a normal mood and affect. His behavior is normal.   Is an ingrown left great toenail with the moderate. Paronychia    Assessment & Plan:   Juan Snyder was seen today for back pain and foot pain.  Diagnoses and all orders for this visit:  Ingrown right greater toenail  Type 2 diabetes mellitus without complication  Essential hypertension, benign  Chronic pain syndrome  Other orders -     sulfamethoxazole-trimethoprim (BACTRIM DS,SEPTRA DS) 800-160 MG per tablet; Take 1 tablet by mouth 2 (two) times daily. -     HYDROcodone-acetaminophen (NORCO) 10-325 MG per tablet; Take 1 tablet by mouth 3 (three) times daily as needed. For pain   I have changed Juan Snyder HYDROcodone-acetaminophen. I am also having him start on sulfamethoxazole-trimethoprim. Additionally, I am having him maintain his amLODipine, carvedilol, furosemide, loratadine, metFORMIN, tamsulosin, terazosin, triamterene-hydrochlorothiazide, and omeprazole.  Meds ordered this encounter  Medications  . sulfamethoxazole-trimethoprim (BACTRIM DS,SEPTRA DS) 800-160 MG per tablet    Sig: Take 1 tablet by mouth 2 (two) times daily.    Dispense:  20 tablet    Refill:  0  . HYDROcodone-acetaminophen (NORCO) 10-325 MG per tablet    Sig: Take 1 tablet by mouth 3 (three) times daily as needed. For pain    Dispense:  30 tablet    Refill:  0   I discussed with him the controlled substance policy offered him a refill to get him through the weekend and he'll follow-up with his doctor next week to get a  regular spondylosis medication  Appropriate red flag conditions were discussed with the patient as well as actions that should be taken.  Patient expressed his understanding.  Follow-up: Return if symptoms worsen or fail to improve.  [name], MD

## 2015-10-18 ENCOUNTER — Telehealth: Payer: Self-pay | Admitting: Family Medicine

## 2015-10-18 NOTE — Telephone Encounter (Signed)
Spoke with patient about his diabetes care.  He is receiving his care from the [facility] on [facility].  We are no longer seeing him for his care.  Will notify his insurance of this change.

## 2017-02-11 DIAGNOSIS — I10 Essential (primary) hypertension: Secondary | ICD-10-CM | POA: Diagnosis not present

## 2017-02-11 DIAGNOSIS — E1165 Type 2 diabetes mellitus with hyperglycemia: Secondary | ICD-10-CM | POA: Diagnosis not present

## 2017-02-11 DIAGNOSIS — F418 Other specified anxiety disorders: Secondary | ICD-10-CM | POA: Diagnosis not present

## 2017-02-11 DIAGNOSIS — Z113 Encounter for screening for infections with a predominantly sexual mode of transmission: Secondary | ICD-10-CM | POA: Diagnosis not present

## 2017-02-11 DIAGNOSIS — E785 Hyperlipidemia, unspecified: Secondary | ICD-10-CM | POA: Diagnosis not present

## 2017-02-11 DIAGNOSIS — Z01118 Encounter for examination of ears and hearing with other abnormal findings: Secondary | ICD-10-CM | POA: Diagnosis not present

## 2017-02-11 DIAGNOSIS — H538 Other visual disturbances: Secondary | ICD-10-CM | POA: Diagnosis not present

## 2017-02-11 DIAGNOSIS — Z0000 Encounter for general adult medical examination without abnormal findings: Secondary | ICD-10-CM | POA: Diagnosis not present

## 2017-02-26 ENCOUNTER — Encounter: Payer: Medicare HMO | Attending: Internal Medicine | Admitting: Registered"

## 2017-02-26 ENCOUNTER — Encounter: Payer: Self-pay | Admitting: Registered"

## 2017-02-26 DIAGNOSIS — E119 Type 2 diabetes mellitus without complications: Secondary | ICD-10-CM | POA: Diagnosis not present

## 2017-02-26 DIAGNOSIS — E118 Type 2 diabetes mellitus with unspecified complications: Secondary | ICD-10-CM

## 2017-02-26 DIAGNOSIS — Z713 Dietary counseling and surveillance: Secondary | ICD-10-CM | POA: Insufficient documentation

## 2017-02-26 NOTE — Progress Notes (Signed)
Diabetes Self-Management Education  Visit Type: First/Initial  Appt. Start Time: 1040 Appt. End Time: 1210  02/26/2017  Juan Snyder, identified by name and date of birth, is a 51 y.o. male with a diagnosis of Diabetes: Type 2.   ASSESSMENT Pt stated fears of amputation and RD provided information about foot care.   Pt states he was taking 850 mg of metformin, but was not tolerating so MD reduced to 500 mg bid. Pt states he was also taking another medication, glimepiride he thinks, doctor discontinued last year because his numbers were improving. Pt states he had lost ~30 lbs, but recently gain about 20 lbs back.  Pt states he uses CPAP for sleep apnea and has been doing 10 yrs. Pt reports it helps his sleep, however lately not getting good sleep and pt believes due to stress.  Dietary changes pt states include cutting back on meats and fruit, and anything with white flour. Pt doesn't drink soda except an occasional ginger ale and believes it was a good choice because they give it to you in the hospital. Pt is checking BG randomly with an average of 140-165.  Likes to [employer], feels limited due to chronic back & hip pain and fatigue. Pt states he likes using his deep fryer and pt states the heating element is in grease which prevents food lowering temp and food won't absorb as much grease.  Pt states physical activity includes walking in park or on treadmill at home - uses cane for pain relief. Pt states he doesn't want to rely on cane, and also states embarrassed that he needs one at his relatively young age. Discussed self-compassion and how using it for pain relief can help with endurance in physical activity as well as pain management may help in efforts to control BG.  Height 5\' 8"  (1.727 m), weight 298 lb 9.6 oz (135.4 kg). Body mass index is 45.4 kg/m.      Diabetes Self-Management Education - 02/26/17 1055      Visit Information   Visit Type First/Initial     Initial Visit    Diabetes Type Type 2   Are you currently following a meal plan? No   Are you taking your medications as prescribed? Yes  metform 500 mg bid   Date Diagnosed over 5 years ago     Health Coping   How would you rate your overall health? Poor     Psychosocial Assessment   Patient Belief/Attitude about Diabetes Afraid   How often do you need to have someone help you when you read instructions, pamphlets, or other written materials from your doctor or pharmacy? 1 - Never   What is the last grade level you completed in school? 12th     Complications   Last HgB A1C per patient/outside source --  pt doesn't know what A1C is   How often do you check your blood sugar? --  1-2 times / week   Number of hypoglycemic episodes per month 0   Have you had a dilated eye exam in the past 12 months? Yes   Have you had a dental exam in the past 12 months? No   Are you checking your feet? Yes   How many days per week are you checking your feet? 1     Dietary Intake   Breakfast skips 3-4/week doesn't feel well. tries to: sausage, grits and eggs, toast OR sausage muffin   Snack (morning) none   Lunch left overs  OR fast food burger, fries, powerade   Snack (afternoon) left overs   Dinner meat (all types of cooking), frozen vegetables (2 or 3), starch, beans   Snack (evening) left overs sometimes chips,    Beverage(s) Powerade, water, 'simply' juice, gingerale      Exercise   Exercise Type Light (walking / raking leaves)   How many days per week to you exercise? 2   How many minutes per day do you exercise? 30   Total minutes per week of exercise 60     Patient Education   Previous Diabetes Education No   Disease state  Definition of diabetes, type 1 and 2, and the diagnosis of diabetes;Factors that contribute to the development of diabetes   Nutrition management  Role of diet in the treatment of diabetes and the relationship between the three main macronutrients and blood glucose level;Reviewed  blood glucose goals for pre and post meals and how to evaluate the patients' food intake on their blood glucose level.   Physical activity and exercise  Role of exercise on diabetes management, blood pressure control and cardiac health.   Medications Reviewed patients medication for diabetes, action, purpose, timing of dose and side effects.   Monitoring Purpose and frequency of SMBG.   Chronic complications Relationship between chronic complications and blood glucose control;Assessed and discussed foot care and prevention of foot problems     Individualized Goals (developed by patient)   Nutrition General guidelines for healthy choices and portions discussed     Outcomes   Expected Outcomes Demonstrated interest in learning. Expect positive outcomes   Future DMSE 4-6 wks   Program Status Not Completed      Individualized Plan for Diabetes Self-Management Training:   Learning Objective:  Patient will have a greater understanding of diabetes self-management. Patient education plan is to attend individual and/or group sessions per assessed needs and concerns.   Plan:   Patient Instructions   Check into what your insurance covers for nutrition counseling for diabetes  Let doctor know about tingling and coldness with feet  Consider using cane when walking.  Aim to eat 3 balanced meals per day: protein, vegetables, carbohydrate  Consider checking BG before a meal and 2 hours after a meal a few times before our next visit  Avoid high sugar beverages inbetween meals   At next visit will teach carb counting  Expected Outcomes:  Demonstrated interest in learning. Expect positive outcomes  Education material provided: Living Well with Diabetes, A1C conversion sheet and My Plate  If problems or questions, patient to contact team via:  Phone and Email  Future DSME appointment: 4-6 wks

## 2017-02-26 NOTE — Patient Instructions (Addendum)
   Check into what your insurance covers for nutrition counseling for diabetes  Let doctor know about tingling and coldness with feet  Consider using cane when walking.  Aim to eat 3 balanced meals per day: protein, vegetables, carbohydrate  Consider checking BG before a meal and 2 hours after a meal a few times before our next visit  Avoid high sugar beverages inbetween meals

## 2017-02-27 DIAGNOSIS — E1165 Type 2 diabetes mellitus with hyperglycemia: Secondary | ICD-10-CM | POA: Diagnosis not present

## 2017-02-27 DIAGNOSIS — G894 Chronic pain syndrome: Secondary | ICD-10-CM | POA: Diagnosis not present

## 2017-02-27 DIAGNOSIS — I10 Essential (primary) hypertension: Secondary | ICD-10-CM | POA: Diagnosis not present

## 2017-02-27 DIAGNOSIS — G629 Polyneuropathy, unspecified: Secondary | ICD-10-CM | POA: Diagnosis not present

## 2017-02-27 DIAGNOSIS — E785 Hyperlipidemia, unspecified: Secondary | ICD-10-CM | POA: Diagnosis not present

## 2017-02-27 DIAGNOSIS — E559 Vitamin D deficiency, unspecified: Secondary | ICD-10-CM | POA: Diagnosis not present

## 2017-02-27 DIAGNOSIS — I119 Hypertensive heart disease without heart failure: Secondary | ICD-10-CM | POA: Diagnosis not present

## 2017-02-27 DIAGNOSIS — F418 Other specified anxiety disorders: Secondary | ICD-10-CM | POA: Diagnosis not present

## 2017-03-27 DIAGNOSIS — E559 Vitamin D deficiency, unspecified: Secondary | ICD-10-CM | POA: Diagnosis not present

## 2017-03-27 DIAGNOSIS — I119 Hypertensive heart disease without heart failure: Secondary | ICD-10-CM | POA: Diagnosis not present

## 2017-03-27 DIAGNOSIS — G894 Chronic pain syndrome: Secondary | ICD-10-CM | POA: Diagnosis not present

## 2017-03-27 DIAGNOSIS — G629 Polyneuropathy, unspecified: Secondary | ICD-10-CM | POA: Diagnosis not present

## 2017-03-27 DIAGNOSIS — E1165 Type 2 diabetes mellitus with hyperglycemia: Secondary | ICD-10-CM | POA: Diagnosis not present

## 2017-03-27 DIAGNOSIS — I10 Essential (primary) hypertension: Secondary | ICD-10-CM | POA: Diagnosis not present

## 2017-03-27 DIAGNOSIS — F418 Other specified anxiety disorders: Secondary | ICD-10-CM | POA: Diagnosis not present

## 2017-03-27 DIAGNOSIS — E785 Hyperlipidemia, unspecified: Secondary | ICD-10-CM | POA: Diagnosis not present

## 2017-03-28 ENCOUNTER — Ambulatory Visit: Payer: Medicare Other | Admitting: Registered"

## 2017-04-02 ENCOUNTER — Encounter: Payer: Medicare HMO | Attending: Internal Medicine | Admitting: Registered"

## 2017-04-02 DIAGNOSIS — E119 Type 2 diabetes mellitus without complications: Secondary | ICD-10-CM | POA: Diagnosis not present

## 2017-04-02 DIAGNOSIS — E118 Type 2 diabetes mellitus with unspecified complications: Secondary | ICD-10-CM

## 2017-04-02 DIAGNOSIS — Z713 Dietary counseling and surveillance: Secondary | ICD-10-CM | POA: Diagnosis not present

## 2017-04-02 NOTE — Progress Notes (Signed)
Diabetes Self-Management Education  Visit Type: Follow-up  Appt. Start Time: 1140 Appt. End Time: 1210  04/02/2017  Mr. Juan Snyder, identified by name and date of birth, is a 51 y.o. male with a diagnosis of Diabetes:  .   ASSESSMENT Pt returns for continued Diabetes Self-Management Education      Diabetes Self-Management Education - 04/02/17 1139      Visit Information   Visit Type Follow-up     Complications   Fasting Blood glucose range (mg/dL) 847-207   Postprandial Blood glucose range (mg/dL) 218-288  33-74 min postprandial     Patient Education   Nutrition management  Role of diet in the treatment of diabetes and the relationship between the three main macronutrients and blood glucose level;Food label reading, portion sizes and measuring food.;Carbohydrate counting;Reviewed blood glucose goals for pre and post meals and how to evaluate the patients' food intake on their blood glucose level.;Meal options for control of blood glucose level and chronic complications.     Individualized Goals (developed by patient)   Nutrition Follow meal plan discussed   Physical Activity Exercise 5-7 days per week     Outcomes   Expected Outcomes Demonstrated interest in learning. Expect positive outcomes   Future DMSE PRN   Program Status Completed     Subsequent Visit   Since your last visit have you had your blood pressure checked? Yes  143/90   Since your last visit, are you checking your blood glucose at least once a day? No      Individualized Plan for Diabetes Self-Management Training:   Learning Objective:  Patient will have a greater understanding of diabetes self-management. Patient education plan is to attend individual and/or group sessions per assessed needs and concerns.   Patient Instructions  Plan:   Good work on changes you are making cutting back on beverages with sugar.  Aim for 2-4 Carb Choices per meal (30-60 grams)   Aim for 0-2 Carbs per snack if  hungry   Include protein with your meals and snacks  Consider reading food labels for Total Carbohydrate and Sat Fat Grams of foods  Continue increasing your activity level daily as tolerated  Continue checking BG at alternate times per day as directed by MD   Consider checking 2 hrs after eating  Continue taking medication as directed by MD  Continue cutting back on ginger ale   Expected Outcomes:  Demonstrated interest in learning. Expect positive outcomes  Education material provided: Snack sheet and Carbohydrate counting sheet  If problems or questions, patient to contact team via:  Phone and Email  Future DSME appointment: PRN

## 2017-04-02 NOTE — Patient Instructions (Addendum)
Plan:   Good work on changes you are making cutting back on beverages with sugar.  Aim for 2-4 Carb Choices per meal (30-60 grams)   Aim for 0-2 Carbs per snack if hungry   Include protein with your meals and snacks  Consider reading food labels for Total Carbohydrate and Sat Fat Grams of foods  Continue increasing your activity level daily as tolerated  Continue checking BG at alternate times per day as directed by MD   Consider checking 2 hrs after eating  Continue taking medication as directed by MD  Continue cutting back on ginger ale

## 2017-04-03 ENCOUNTER — Ambulatory Visit: Payer: Medicare Other | Admitting: Registered"

## 2017-07-02 ENCOUNTER — Other Ambulatory Visit: Payer: Self-pay | Admitting: Pharmacy Technician

## 2017-07-02 NOTE — Patient Outreach (Signed)
[facility]) Care Management  07/02/2017  Juan Snyder 1966-02-23 [id number]   Contacted patient in regards to Metformin 500mg  medication adherence. No answer, Left HIPAA appropriate voicemail  [name]. [name] [facility] Care Management [phone]

## 2017-07-03 DIAGNOSIS — G894 Chronic pain syndrome: Secondary | ICD-10-CM | POA: Diagnosis not present

## 2017-07-03 DIAGNOSIS — G629 Polyneuropathy, unspecified: Secondary | ICD-10-CM | POA: Diagnosis not present

## 2017-07-03 DIAGNOSIS — I10 Essential (primary) hypertension: Secondary | ICD-10-CM | POA: Diagnosis not present

## 2017-07-03 DIAGNOSIS — E559 Vitamin D deficiency, unspecified: Secondary | ICD-10-CM | POA: Diagnosis not present

## 2017-07-03 DIAGNOSIS — I119 Hypertensive heart disease without heart failure: Secondary | ICD-10-CM | POA: Diagnosis not present

## 2017-07-03 DIAGNOSIS — F418 Other specified anxiety disorders: Secondary | ICD-10-CM | POA: Diagnosis not present

## 2017-07-03 DIAGNOSIS — E785 Hyperlipidemia, unspecified: Secondary | ICD-10-CM | POA: Diagnosis not present

## 2017-07-03 DIAGNOSIS — E1165 Type 2 diabetes mellitus with hyperglycemia: Secondary | ICD-10-CM | POA: Diagnosis not present

## 2017-07-25 DIAGNOSIS — K5730 Diverticulosis of large intestine without perforation or abscess without bleeding: Secondary | ICD-10-CM | POA: Diagnosis not present

## 2017-07-25 DIAGNOSIS — Z1211 Encounter for screening for malignant neoplasm of colon: Secondary | ICD-10-CM | POA: Diagnosis not present

## 2017-09-19 DIAGNOSIS — E1165 Type 2 diabetes mellitus with hyperglycemia: Secondary | ICD-10-CM | POA: Diagnosis not present

## 2017-09-19 DIAGNOSIS — I10 Essential (primary) hypertension: Secondary | ICD-10-CM | POA: Diagnosis not present

## 2017-09-19 DIAGNOSIS — G894 Chronic pain syndrome: Secondary | ICD-10-CM | POA: Diagnosis not present

## 2017-09-19 DIAGNOSIS — G629 Polyneuropathy, unspecified: Secondary | ICD-10-CM | POA: Diagnosis not present

## 2017-09-19 DIAGNOSIS — I119 Hypertensive heart disease without heart failure: Secondary | ICD-10-CM | POA: Diagnosis not present

## 2017-09-19 DIAGNOSIS — E785 Hyperlipidemia, unspecified: Secondary | ICD-10-CM | POA: Diagnosis not present

## 2017-09-19 DIAGNOSIS — G4733 Obstructive sleep apnea (adult) (pediatric): Secondary | ICD-10-CM | POA: Diagnosis not present

## 2017-09-19 DIAGNOSIS — E559 Vitamin D deficiency, unspecified: Secondary | ICD-10-CM | POA: Diagnosis not present

## 2017-09-19 DIAGNOSIS — F418 Other specified anxiety disorders: Secondary | ICD-10-CM | POA: Diagnosis not present

## 2017-10-04 DIAGNOSIS — I10 Essential (primary) hypertension: Secondary | ICD-10-CM | POA: Diagnosis not present

## 2017-10-04 DIAGNOSIS — I119 Hypertensive heart disease without heart failure: Secondary | ICD-10-CM | POA: Diagnosis not present

## 2017-10-22 DIAGNOSIS — E1165 Type 2 diabetes mellitus with hyperglycemia: Secondary | ICD-10-CM | POA: Diagnosis not present

## 2017-10-22 DIAGNOSIS — Z23 Encounter for immunization: Secondary | ICD-10-CM | POA: Diagnosis not present

## 2017-10-22 DIAGNOSIS — G4733 Obstructive sleep apnea (adult) (pediatric): Secondary | ICD-10-CM | POA: Diagnosis not present

## 2017-10-22 DIAGNOSIS — I10 Essential (primary) hypertension: Secondary | ICD-10-CM | POA: Diagnosis not present

## 2017-10-22 DIAGNOSIS — I119 Hypertensive heart disease without heart failure: Secondary | ICD-10-CM | POA: Diagnosis not present

## 2017-10-22 DIAGNOSIS — F418 Other specified anxiety disorders: Secondary | ICD-10-CM | POA: Diagnosis not present

## 2017-10-22 DIAGNOSIS — E785 Hyperlipidemia, unspecified: Secondary | ICD-10-CM | POA: Diagnosis not present

## 2017-10-22 DIAGNOSIS — E559 Vitamin D deficiency, unspecified: Secondary | ICD-10-CM | POA: Diagnosis not present

## 2017-12-24 DIAGNOSIS — Z01118 Encounter for examination of ears and hearing with other abnormal findings: Secondary | ICD-10-CM | POA: Diagnosis not present

## 2017-12-24 DIAGNOSIS — I119 Hypertensive heart disease without heart failure: Secondary | ICD-10-CM | POA: Diagnosis not present

## 2017-12-24 DIAGNOSIS — G894 Chronic pain syndrome: Secondary | ICD-10-CM | POA: Diagnosis not present

## 2017-12-24 DIAGNOSIS — G629 Polyneuropathy, unspecified: Secondary | ICD-10-CM | POA: Diagnosis not present

## 2017-12-24 DIAGNOSIS — I10 Essential (primary) hypertension: Secondary | ICD-10-CM | POA: Diagnosis not present

## 2017-12-24 DIAGNOSIS — E785 Hyperlipidemia, unspecified: Secondary | ICD-10-CM | POA: Diagnosis not present

## 2017-12-24 DIAGNOSIS — E559 Vitamin D deficiency, unspecified: Secondary | ICD-10-CM | POA: Diagnosis not present

## 2017-12-24 DIAGNOSIS — F418 Other specified anxiety disorders: Secondary | ICD-10-CM | POA: Diagnosis not present

## 2017-12-24 DIAGNOSIS — E1165 Type 2 diabetes mellitus with hyperglycemia: Secondary | ICD-10-CM | POA: Diagnosis not present

## 2017-12-24 DIAGNOSIS — Z0000 Encounter for general adult medical examination without abnormal findings: Secondary | ICD-10-CM | POA: Diagnosis not present

## 2017-12-24 DIAGNOSIS — H538 Other visual disturbances: Secondary | ICD-10-CM | POA: Diagnosis not present

## 2017-12-27 DIAGNOSIS — I4892 Unspecified atrial flutter: Secondary | ICD-10-CM | POA: Diagnosis not present

## 2017-12-27 DIAGNOSIS — N529 Male erectile dysfunction, unspecified: Secondary | ICD-10-CM | POA: Diagnosis not present

## 2017-12-27 DIAGNOSIS — I10 Essential (primary) hypertension: Secondary | ICD-10-CM | POA: Diagnosis not present

## 2017-12-27 DIAGNOSIS — E119 Type 2 diabetes mellitus without complications: Secondary | ICD-10-CM | POA: Diagnosis not present

## 2018-01-06 DIAGNOSIS — R309 Painful micturition, unspecified: Secondary | ICD-10-CM | POA: Diagnosis not present

## 2018-01-06 DIAGNOSIS — Z7721 Contact with and (suspected) exposure to potentially hazardous body fluids: Secondary | ICD-10-CM | POA: Diagnosis not present

## 2018-01-06 DIAGNOSIS — Z7251 High risk heterosexual behavior: Secondary | ICD-10-CM | POA: Diagnosis not present

## 2018-02-12 DIAGNOSIS — I4892 Unspecified atrial flutter: Secondary | ICD-10-CM | POA: Diagnosis not present

## 2018-02-13 DIAGNOSIS — E1165 Type 2 diabetes mellitus with hyperglycemia: Secondary | ICD-10-CM | POA: Diagnosis not present

## 2018-02-13 DIAGNOSIS — I119 Hypertensive heart disease without heart failure: Secondary | ICD-10-CM | POA: Diagnosis not present

## 2018-02-13 DIAGNOSIS — G894 Chronic pain syndrome: Secondary | ICD-10-CM | POA: Diagnosis not present

## 2018-02-13 DIAGNOSIS — E559 Vitamin D deficiency, unspecified: Secondary | ICD-10-CM | POA: Diagnosis not present

## 2018-02-13 DIAGNOSIS — I10 Essential (primary) hypertension: Secondary | ICD-10-CM | POA: Diagnosis not present

## 2018-02-13 DIAGNOSIS — Z0000 Encounter for general adult medical examination without abnormal findings: Secondary | ICD-10-CM | POA: Diagnosis not present

## 2018-02-13 DIAGNOSIS — G629 Polyneuropathy, unspecified: Secondary | ICD-10-CM | POA: Diagnosis not present

## 2018-02-13 DIAGNOSIS — F418 Other specified anxiety disorders: Secondary | ICD-10-CM | POA: Diagnosis not present

## 2018-02-13 DIAGNOSIS — E785 Hyperlipidemia, unspecified: Secondary | ICD-10-CM | POA: Diagnosis not present

## 2018-02-17 NOTE — H&P (Signed)
Juan Snyder February 12, 2018 11:45 AM Location: [facility] Patient #: [id number] DOB: 11/13/1966 Divorced / Language: Juan Snyder / Race: Black or African American Male   History of Present Illness Juan Snyder; 12-Feb-2018 1:25 PM) Patient words: Last OV 12/27/2017; f/u for sob, aflutter & echo results  no complaints.  The patient is a 52 year old male, accompanied by his other (Girlfriend, Ms. Edwards) during the visit, who presents for a Follow-up for Shortness of breath. 52 year old African-American male with hypertension, type II diabetes mellitus, morbid obesity, obstructive sleep apnea on CPAP, now with persistent atrial fibrillation/flutter  At last visit, started him on diltiazem 120 mg once daily, and Xarelto 20 mg daily. He finally started taking Xarelto 2 weeks ago. His heart rate is better controlled today, still in atrial fibrillation. His exertional dyspnea has improved, still persists. He denies any chest pain.   Problem List/Past Medical Juan Snyder; February 12, 2018 11:52 AM) Laboratory examination (Z01.89)  Labs 07/03/2017: H/H 14/42. MCV 79. Platelets 210 Glucose 146. BUN/creatinine 15/1.09. Sodium 142, potassium 4.6. EGFR 78. TSH 1.6 normal. Cholesterol 219. Triglyceride 160. HDL 59. LDL 128 Atrial flutter by electrocardiogram (I48.92)  EKG 12/27/2017: Typical atrial flutter with variable AV conduction. Controlled rate response. Normal axis. Low-voltage limb leads. PVC seen. CHA2DS2VASc score 3 : Annual stroke risk 3.2$ Erectile dysfunction (N52.9)  Benign essential hypertension (I10)  Controlled type 2 diabetes mellitus without complication, without long-term current use of insulin (E11.9)  Hyperlipidemia, group A (E78.00)  Morbid obesity (E66.01)   Allergies Juan Snyder; 02/12/2018 11:52 AM) No Known Drug Allergies [12/27/2017]:  Family History Juan Snyder; 02/12/2018 11:52 AM) Mother  In stable health. no heart issues; Kidney  problems Father  Deceased. unsure of father Brother 2  1 older; 1 younger; no heart issues  Social History Juan Snyder; February 12, 2018 11:52 AM) Current tobacco use  Never smoker. Alcohol Use  Drinks beer. Occasional beer or Vodka Marital status  Divorced. Living Situation  Lives alone. Number of Children  3.  Past Surgical History Juan Snyder; 02-12-18 11:52 AM) Shoulder Surgery [2007]:  Medication History Juan Snyder; Feb 12, 2018 12:05 PM) DilTIAZem CD (120MG Capsule ER 24HR, 1 (one) Capsule Oral once daily, Taken starting 12/27/2017) Active. Xarelto (20MG Tablet, 1 (one) Tablet Oral once daily, Taken starting 12/27/2017) Active. Benazepril HCl (20MG Tablet, 1 (one) Tablet Oral once daily, Taken starting 12/27/2017) Active. Sildenafil Citrate (50MG Tablet, 1 (one) Tablet Oral as needed, Taken starting 12/27/2017) Active. (Never use nitrates within 48 hts of using this medication.) Triamterene-HCTZ (37.5-25MG Tablet, 1 Oral daily) Active. Furosemide (40MG Tablet, 1 Oral daily) Active. Terazosin HCl (5MG Capsule, 1 Oral daily) Active. MetFORMIN HCl (500MG Tablet, 1 Oral two times daily) Active. Gabapentin (300MG Capsule, 1 Oral two times daily) Active. Aspirin (81MG Capsule, 1 Oral daily) Active. Multivitamin Adults (Oral daily) Active. Medications Reconciled (list present)  Diagnostic Studies History Juan Snyder; 2018-02-12 10:03 AM) Echocardiogram  10/04/2017: Poor visualization. Mild RV dilatation. EF 66%. Normal RV size contractility. Mild left atrial dilatation. No significant valvular abnormalities. RVSP 27 mmHg.    Review of Systems Juan Snyder; 2018-02-12 1:26 PM) General Not Present- Appetite Loss and Weight Gain. Respiratory Not Present- Chronic Cough and Wakes up from Sleep Wheezing or Short of Breath. Cardiovascular Present- Difficulty Breathing Lying Down, Difficulty Breathing On Exertion, Edema (improved) and Orthopnea. Not  Present- Chest Pain, Claudications, Fainting and Palpitations. Gastrointestinal Not Present- Black, Tarry Stool and Difficulty Swallowing. Musculoskeletal Not Present- Decreased Range of Motion and Muscle Atrophy. Neurological Not  Present- Attention Deficit. Psychiatric Not Present- Personality Changes and Suicidal Ideation. Endocrine Not Present- Cold Intolerance and Heat Intolerance. Hematology Not Present- Abnormal Bleeding. All other systems negative  Vitals Juan Snyder; 01/22/2018 12:29 PM) 01/22/2018 11:59 AM Weight: 306.13 lb Height: 68in Body Surface Area: 2.45 m Body Mass Index: 46.55 kg/m  Pulse: 92 (Irregular)  BP: 132/68 (Sitting, Left Arm, Standard)       Physical Exam Juan Snyder Juan Snyder; 01/22/2018 1:19 PM) General Mental Status-Alert. General Appearance-Cooperative and Appears stated age. Build & Nutrition-Moderately built.  Head and Neck Thyroid Gland Characteristics - normal size and consistency and no palpable nodules.  Chest and Lung Exam Chest and lung exam reveals -quiet, even and easy respiratory effort with no use of accessory muscles, non-tender and on auscultation, normal breath sounds, no adventitious sounds.  Cardiovascular Cardiovascular examination reveals -carotid auscultation reveals no bruits, abdominal aorta auscultation reveals no bruits and no prominent pulsation and femoral artery auscultation bilaterally reveals normal pulses, no bruits, no thrills. Auscultation Rhythm - Irregularly irregular. Murmurs & Other Heart Sounds - Murmur - No murmur.  Abdomen Palpation/Percussion Normal exam - Non Tender and No hepatosplenomegaly.  Peripheral Vascular Lower Extremity Palpation - Edema - Bilateral - Trace edema. Dorsalis pedis pulse - Bilateral - 2+. Posterior tibial pulse - Bilateral - 2+. Carotid arteries - Bilateral-No Carotid bruit.  Neurologic Neurologic evaluation reveals -alert and oriented x 3 with  no impairment of recent or remote memory. Motor-Grossly intact without any focal deficits.  Musculoskeletal Global Assessment Left Lower Extremity - no deformities, masses or tenderness, no known fractures. Right Lower Extremity - no deformities, masses or tenderness, no known fractures.    Assessment & Plan Juan Snyder Mathan Darroch Snyder; 01/22/2018 1:26 PM) Atrial flutter by electrocardiogram (I48.92) Story: EKG 12/27/2017: Typical atrial flutter with variable AV conduction. Controlled rate response. Normal axis. Low-voltage limb leads. PVC seen.  CHA2DS2VASc score 3 : Annual stroke risk 3.2% Current Plans Continued Xarelto 20MG, 1 (one) Tablet once daily, #60, 60 days starting 01/22/2018, Ref. x3. Continued Benazepril HCl 20MG, 1 (one) Tablet once daily, #60, 60 days starting 01/22/2018, Ref. x3. Started DilTIAZem HCl ER 120MG, 1 (one) Capsule once daily, 60 QS, 60 days starting 01/22/2018, Ref. x3. Continued Sildenafil Citrate 50MG, 1 (one) Tablet as needed, #30, 30 days starting 01/22/2018, Ref. x1. Local Order: Never use nitrates within 48 hrs of using this medication. Future Plans 4/70/9628: METABOLIC PANEL, BASIC (36629) - one time Erectile dysfunction (N52.9) Benign essential hypertension (I10) Controlled type 2 diabetes mellitus without complication, without long-term current use of insulin (E11.9) Hyperlipidemia, group A (E78.00) Morbid obesity (E66.01) OSA on CPAP (G47.33)  Note:Recommendations:  52 year old African-American male with hypertension, type II diabetes mellitus, morbid obesity, obstructive sleep apnea on CPAP, now with persistent atrial fibrillation/flutter.  Recommend continuing diltiazem 120 mg daily, Cipro 200 mg daily, along with benazepril. Diabetes managed by PCP. I recommend continuing uninterrupted anticoagulation for at least 4 weeks, followed by cardioversion attempt in 1st week of April. Since he has combination of atrial fibrillation and flutter, he  may not be the best candidate for ablation. If he has recurrence of typical flutter alone, could consider referral to EP for flutter ablation. If his symptoms of exertional dyspnea do not resolve after successful cardioversion, could consider ischemic evaluation with stress test. . Aspirin while on Xarelto. No absolute indication at this time. Consider statin. He is using sildenafil for erectile dysfunction, I have cautioned him against concurrent use of nitrates. Continue regular use of CPAP  as overseas risk factor for atrial arrhythmias. Needs to lose weight.  Cc Iona Beard Osei-Bonsu  Signed electronically by Juan Snyder (01/22/2018 1:26 PM)

## 2018-02-18 ENCOUNTER — Other Ambulatory Visit: Payer: Self-pay

## 2018-02-18 ENCOUNTER — Ambulatory Visit (HOSPITAL_COMMUNITY)
Admission: RE | Admit: 2018-02-18 | Discharge: 2018-02-18 | Disposition: A | Discharge status: Home / Self Care | Payer: Medicare HMO | Source: Ambulatory Visit | Attending: Cardiology | Admitting: Cardiology

## 2018-02-18 ENCOUNTER — Encounter (HOSPITAL_COMMUNITY)
Admission: RE | Disposition: A | Discharge status: Home / Self Care | Payer: Self-pay | Source: Ambulatory Visit | Attending: Cardiology

## 2018-02-18 ENCOUNTER — Ambulatory Visit (HOSPITAL_COMMUNITY): Payer: Medicare HMO | Admitting: Anesthesiology

## 2018-02-18 ENCOUNTER — Encounter (HOSPITAL_COMMUNITY): Payer: Self-pay | Admitting: *Deleted

## 2018-02-18 DIAGNOSIS — I110 Hypertensive heart disease with heart failure: Secondary | ICD-10-CM | POA: Diagnosis not present

## 2018-02-18 DIAGNOSIS — G4733 Obstructive sleep apnea (adult) (pediatric): Secondary | ICD-10-CM | POA: Insufficient documentation

## 2018-02-18 DIAGNOSIS — Z9989 Dependence on other enabling machines and devices: Secondary | ICD-10-CM | POA: Diagnosis not present

## 2018-02-18 DIAGNOSIS — J45909 Unspecified asthma, uncomplicated: Secondary | ICD-10-CM | POA: Diagnosis not present

## 2018-02-18 DIAGNOSIS — I10 Essential (primary) hypertension: Secondary | ICD-10-CM | POA: Diagnosis not present

## 2018-02-18 DIAGNOSIS — E119 Type 2 diabetes mellitus without complications: Secondary | ICD-10-CM | POA: Diagnosis not present

## 2018-02-18 DIAGNOSIS — I481 Persistent atrial fibrillation: Secondary | ICD-10-CM | POA: Insufficient documentation

## 2018-02-18 DIAGNOSIS — N529 Male erectile dysfunction, unspecified: Secondary | ICD-10-CM | POA: Diagnosis not present

## 2018-02-18 DIAGNOSIS — Z7982 Long term (current) use of aspirin: Secondary | ICD-10-CM | POA: Insufficient documentation

## 2018-02-18 DIAGNOSIS — I509 Heart failure, unspecified: Secondary | ICD-10-CM | POA: Diagnosis not present

## 2018-02-18 DIAGNOSIS — Z6841 Body Mass Index (BMI) 40.0 and over, adult: Secondary | ICD-10-CM | POA: Insufficient documentation

## 2018-02-18 DIAGNOSIS — Z79899 Other long term (current) drug therapy: Secondary | ICD-10-CM | POA: Insufficient documentation

## 2018-02-18 DIAGNOSIS — Z7901 Long term (current) use of anticoagulants: Secondary | ICD-10-CM | POA: Insufficient documentation

## 2018-02-18 DIAGNOSIS — I4892 Unspecified atrial flutter: Secondary | ICD-10-CM | POA: Diagnosis not present

## 2018-02-18 DIAGNOSIS — Z7984 Long term (current) use of oral hypoglycemic drugs: Secondary | ICD-10-CM | POA: Diagnosis not present

## 2018-02-18 DIAGNOSIS — E785 Hyperlipidemia, unspecified: Secondary | ICD-10-CM | POA: Diagnosis not present

## 2018-02-18 DIAGNOSIS — I4891 Unspecified atrial fibrillation: Secondary | ICD-10-CM | POA: Diagnosis not present

## 2018-02-18 HISTORY — PX: CARDIOVERSION: SHX1299

## 2018-02-18 LAB — GLUCOSE, CAPILLARY: Glucose-Capillary: 105 mg/dL — ABNORMAL HIGH (ref 65–99)

## 2018-02-18 SURGERY — CARDIOVERSION
Anesthesia: General

## 2018-02-18 MED ORDER — LIDOCAINE 2% (20 MG/ML) 5 ML SYRINGE
INTRAMUSCULAR | Status: DC | PRN
  Administered 2018-02-18: 80 mg via INTRAVENOUS

## 2018-02-18 MED ORDER — FENTANYL CITRATE (PF) 100 MCG/2ML IJ SOLN: 25.0000 ug | INTRAMUSCULAR | Status: DC | PRN

## 2018-02-18 MED ORDER — SODIUM CHLORIDE 0.9 % IV SOLN
INTRAVENOUS | Status: DC | PRN
  Administered 2018-02-18: 13:00:00 via INTRAVENOUS

## 2018-02-18 MED ORDER — PROPOFOL 10 MG/ML IV BOLUS
INTRAVENOUS | Status: DC | PRN
  Administered 2018-02-18: 100 mg via INTRAVENOUS
  Administered 2018-02-18: 30 mg via INTRAVENOUS

## 2018-02-18 MED ORDER — ACETAMINOPHEN 325 MG PO TABS: 325.0000 mg | ORAL_TABLET | ORAL | Status: DC | PRN

## 2018-02-18 MED ORDER — OXYCODONE HCL 5 MG/5ML PO SOLN: 5.0000 mg | Freq: Once | ORAL | Status: DC | PRN

## 2018-02-18 MED ORDER — ONDANSETRON HCL 4 MG/2ML IJ SOLN: 4.0000 mg | Freq: Once | INTRAMUSCULAR | Status: DC | PRN

## 2018-02-18 MED ORDER — ACETAMINOPHEN 160 MG/5ML PO SOLN: 325.0000 mg | ORAL | Status: DC | PRN

## 2018-02-18 MED ORDER — MEPERIDINE HCL 100 MG/ML IJ SOLN: 6.2500 mg | INTRAMUSCULAR | Status: DC | PRN

## 2018-02-18 MED ORDER — OXYCODONE HCL 5 MG PO TABS: 5.0000 mg | ORAL_TABLET | Freq: Once | ORAL | Status: DC | PRN

## 2018-02-18 NOTE — Anesthesia Preprocedure Evaluation (Signed)
Anesthesia Evaluation  Patient identified by MRN, date of birth, ID band Patient awake    Reviewed: Allergy & Precautions, NPO status , Patient's Chart, lab work & pertinent test results  Airway Mallampati: II  TM Distance: >3 FB Neck ROM: Full    Dental no notable dental hx.    Pulmonary asthma ,    Pulmonary exam normal breath sounds clear to auscultation       Cardiovascular hypertension, Pt. on medications +CHF  Normal cardiovascular exam+ dysrhythmias Atrial Fibrillation  Rhythm:Regular Rate:Normal     Neuro/Psych negative neurological ROS  negative psych ROS   GI/Hepatic negative GI ROS, Neg liver ROS,   Endo/Other  diabetes, Type 2, Oral Hypoglycemic Agents  Renal/GU negative Renal ROS  negative genitourinary   Musculoskeletal negative musculoskeletal ROS (+)   Abdominal   Peds negative pediatric ROS (+)  Hematology negative hematology ROS (+)   Anesthesia Other Findings   Reproductive/Obstetrics negative OB ROS                             Anesthesia Physical Anesthesia Plan  ASA: II  Anesthesia Plan: MAC   Post-op Pain Management:    Induction: Intravenous  PONV Risk Score and Plan: 1 and Treatment may vary due to age or medical condition  Airway Management Planned: Mask, Natural Airway and Nasal Cannula  Additional Equipment:   Intra-op Plan:   Post-operative Plan:   Informed Consent: I have reviewed the patients History and Physical, chart, labs and discussed the procedure including the risks, benefits and alternatives for the proposed anesthesia with the patient or authorized representative who has indicated his/her understanding and acceptance.     Plan Discussed with: CRNA, Anesthesiologist and Surgeon  Anesthesia Plan Comments:         Anesthesia Quick Evaluation

## 2018-02-18 NOTE — Progress Notes (Signed)
Call placed to Dr. Tacy Dura advised patient ate plum at 0530 today. Per Dr. Tacy Dura ok to proceed with cardioversion at scheduled time of 1245. Call placed to Dr. Rosemary Holms, advised patient reports missing 1 dose of xarelto 2-2.5 weeks ago. Has taken daily since this time. Per Dr. Rosemary Holms ok to proceed with cardioversion as scheduled today,

## 2018-02-18 NOTE — CV Procedure (Signed)
Direct current cardioversion:  Indication symptomatic A. Fibrillation.  Procedure: Using 130 mg of IV Propofol and 80 IV Lidocaine (for reducing venous pain) for achieving deep sedation (administered and monitored by anesthesiology), synchronized direct current cardioversion performed. Patient was delivered with 120, 150, 200, 200 J Joules of electricity X 4 with success to NSR. Patient tolerated the procedure well. No immediate complication noted.   Elder Negus, MD [facility] [place] Pager: [phone] Office: [phone] If no answer Cell [phone]

## 2018-02-18 NOTE — Transfer of Care (Signed)
Immediate Anesthesia Transfer of Care Note  Patient: Juan Snyder  Procedure(s) Performed: CARDIOVERSION (N/A )  Patient Location: Endoscopy Unit  Anesthesia Type:General  Level of Consciousness: awake and patient cooperative  Airway & Oxygen Therapy: Patient Spontanous Breathing and Patient connected to face mask oxygen  Post-op Assessment: Report given to RN and Post -op Vital signs reviewed and stable  Post vital signs: Reviewed and stable  Last Vitals:  Vitals Value Taken Time  BP    Temp    Pulse    Resp    SpO2      Last Pain:  Vitals:   02/18/18 1246  TempSrc: Oral  PainSc: 0-No pain         Complications: No apparent anesthesia complications

## 2018-02-18 NOTE — Anesthesia Postprocedure Evaluation (Signed)
Anesthesia Post Note  Patient: Juan Snyder  Procedure(s) Performed: CARDIOVERSION (N/A )     Patient location during evaluation: PACU Anesthesia Type: General Level of consciousness: awake and alert Pain management: pain level controlled Vital Signs Assessment: post-procedure vital signs reviewed and stable Respiratory status: spontaneous breathing, nonlabored ventilation, respiratory function stable and patient connected to nasal cannula oxygen Cardiovascular status: blood pressure returned to baseline and stable Postop Assessment: no apparent nausea or vomiting Anesthetic complications: no    Last Vitals:  Vitals:   02/18/18 1246  BP: 116/67  Resp: 16  Temp: 36.8 C  SpO2: 98%    Last Pain:  Vitals:   02/18/18 1246  TempSrc: Oral  PainSc: 0-No pain                 Alysandra Lobue

## 2018-02-18 NOTE — Addendum Note (Signed)
Addendum  created 02/18/18 1328 by Julian Reil, CRNA   Intraprocedure Event edited, Intraprocedure Flowsheets edited

## 2018-02-18 NOTE — Anesthesia Procedure Notes (Signed)
Procedure Name: General with mask airway Date/Time: 02/18/2018 1:03 PM Performed by: Julian Reil, CRNA Pre-anesthesia Checklist: Patient identified, Emergency Drugs available, Suction available, Patient being monitored and Timeout performed Patient Re-evaluated:Patient Re-evaluated prior to induction Oxygen Delivery Method: Ambu bag Preoxygenation: Pre-oxygenation with 100% oxygen Induction Type: IV induction

## 2018-02-18 NOTE — Interval H&P Note (Signed)
History and Physical Interval Note:  02/18/2018 12:58 PM  Juan Snyder  has presented today for surgery, with the diagnosis of afib  The various methods of treatment have been discussed with the patient and family. After consideration of risks, benefits and other options for treatment, the patient has consented to  Procedure(s): CARDIOVERSION (N/A) as a surgical intervention .  The patient's history has been reviewed, patient examined, no change in status, stable for surgery.  I have reviewed the patient's chart and labs.  Questions were answered to the patient's satisfaction.     Deya Bigos J Zyon Rosser

## 2018-03-05 DIAGNOSIS — H5210 Myopia, unspecified eye: Secondary | ICD-10-CM | POA: Diagnosis not present

## 2018-03-05 DIAGNOSIS — I10 Essential (primary) hypertension: Secondary | ICD-10-CM | POA: Diagnosis not present

## 2018-03-05 DIAGNOSIS — Z0100 Encounter for examination of eyes and vision without abnormal findings: Secondary | ICD-10-CM | POA: Diagnosis not present

## 2018-03-05 DIAGNOSIS — E109 Type 1 diabetes mellitus without complications: Secondary | ICD-10-CM | POA: Diagnosis not present

## 2018-05-26 DIAGNOSIS — G4733 Obstructive sleep apnea (adult) (pediatric): Secondary | ICD-10-CM | POA: Diagnosis not present

## 2018-05-26 DIAGNOSIS — G894 Chronic pain syndrome: Secondary | ICD-10-CM | POA: Diagnosis not present

## 2018-05-26 DIAGNOSIS — I119 Hypertensive heart disease without heart failure: Secondary | ICD-10-CM | POA: Diagnosis not present

## 2018-05-26 DIAGNOSIS — G629 Polyneuropathy, unspecified: Secondary | ICD-10-CM | POA: Diagnosis not present

## 2018-05-26 DIAGNOSIS — E785 Hyperlipidemia, unspecified: Secondary | ICD-10-CM | POA: Diagnosis not present

## 2018-05-26 DIAGNOSIS — I10 Essential (primary) hypertension: Secondary | ICD-10-CM | POA: Diagnosis not present

## 2018-05-26 DIAGNOSIS — F418 Other specified anxiety disorders: Secondary | ICD-10-CM | POA: Diagnosis not present

## 2018-05-26 DIAGNOSIS — E559 Vitamin D deficiency, unspecified: Secondary | ICD-10-CM | POA: Diagnosis not present

## 2018-05-26 DIAGNOSIS — E1165 Type 2 diabetes mellitus with hyperglycemia: Secondary | ICD-10-CM | POA: Diagnosis not present

## 2018-06-12 DIAGNOSIS — E559 Vitamin D deficiency, unspecified: Secondary | ICD-10-CM | POA: Diagnosis not present

## 2018-06-12 DIAGNOSIS — G894 Chronic pain syndrome: Secondary | ICD-10-CM | POA: Diagnosis not present

## 2018-06-12 DIAGNOSIS — I119 Hypertensive heart disease without heart failure: Secondary | ICD-10-CM | POA: Diagnosis not present

## 2018-06-12 DIAGNOSIS — G629 Polyneuropathy, unspecified: Secondary | ICD-10-CM | POA: Diagnosis not present

## 2018-06-12 DIAGNOSIS — I10 Essential (primary) hypertension: Secondary | ICD-10-CM | POA: Diagnosis not present

## 2018-06-12 DIAGNOSIS — F418 Other specified anxiety disorders: Secondary | ICD-10-CM | POA: Diagnosis not present

## 2018-06-12 DIAGNOSIS — E785 Hyperlipidemia, unspecified: Secondary | ICD-10-CM | POA: Diagnosis not present

## 2018-06-12 DIAGNOSIS — E1165 Type 2 diabetes mellitus with hyperglycemia: Secondary | ICD-10-CM | POA: Diagnosis not present

## 2018-07-30 DIAGNOSIS — I480 Paroxysmal atrial fibrillation: Secondary | ICD-10-CM | POA: Diagnosis not present

## 2018-08-07 DIAGNOSIS — I10 Essential (primary) hypertension: Secondary | ICD-10-CM | POA: Diagnosis not present

## 2018-08-07 DIAGNOSIS — M79672 Pain in left foot: Secondary | ICD-10-CM | POA: Diagnosis not present

## 2018-08-07 DIAGNOSIS — E559 Vitamin D deficiency, unspecified: Secondary | ICD-10-CM | POA: Diagnosis not present

## 2018-08-07 DIAGNOSIS — G894 Chronic pain syndrome: Secondary | ICD-10-CM | POA: Diagnosis not present

## 2018-08-07 DIAGNOSIS — E785 Hyperlipidemia, unspecified: Secondary | ICD-10-CM | POA: Diagnosis not present

## 2018-08-07 DIAGNOSIS — I119 Hypertensive heart disease without heart failure: Secondary | ICD-10-CM | POA: Diagnosis not present

## 2018-08-07 DIAGNOSIS — E1165 Type 2 diabetes mellitus with hyperglycemia: Secondary | ICD-10-CM | POA: Diagnosis not present

## 2018-08-10 ENCOUNTER — Observation Stay (HOSPITAL_COMMUNITY): Payer: Medicare HMO

## 2018-08-10 ENCOUNTER — Other Ambulatory Visit: Payer: Self-pay

## 2018-08-10 ENCOUNTER — Encounter (HOSPITAL_COMMUNITY): Payer: Self-pay

## 2018-08-10 ENCOUNTER — Inpatient Hospital Stay (HOSPITAL_COMMUNITY)
Admission: EM | Admit: 2018-08-10 | Discharge: 2018-08-14 | DRG: 286 | Disposition: A | Discharge status: Home / Self Care | Payer: Medicare HMO | Attending: Family Medicine | Admitting: Family Medicine

## 2018-08-10 ENCOUNTER — Emergency Department (HOSPITAL_COMMUNITY): Payer: Medicare HMO

## 2018-08-10 DIAGNOSIS — M79671 Pain in right foot: Secondary | ICD-10-CM | POA: Diagnosis present

## 2018-08-10 DIAGNOSIS — I429 Cardiomyopathy, unspecified: Secondary | ICD-10-CM | POA: Diagnosis present

## 2018-08-10 DIAGNOSIS — I10 Essential (primary) hypertension: Secondary | ICD-10-CM | POA: Diagnosis not present

## 2018-08-10 DIAGNOSIS — Z9119 Patient's noncompliance with other medical treatment and regimen: Secondary | ICD-10-CM | POA: Diagnosis not present

## 2018-08-10 DIAGNOSIS — I130 Hypertensive heart and chronic kidney disease with heart failure and stage 1 through stage 4 chronic kidney disease, or unspecified chronic kidney disease: Secondary | ICD-10-CM | POA: Diagnosis present

## 2018-08-10 DIAGNOSIS — I4892 Unspecified atrial flutter: Secondary | ICD-10-CM | POA: Diagnosis present

## 2018-08-10 DIAGNOSIS — K5900 Constipation, unspecified: Secondary | ICD-10-CM | POA: Diagnosis present

## 2018-08-10 DIAGNOSIS — E119 Type 2 diabetes mellitus without complications: Secondary | ICD-10-CM | POA: Diagnosis not present

## 2018-08-10 DIAGNOSIS — Z7901 Long term (current) use of anticoagulants: Secondary | ICD-10-CM

## 2018-08-10 DIAGNOSIS — G4733 Obstructive sleep apnea (adult) (pediatric): Secondary | ICD-10-CM | POA: Diagnosis present

## 2018-08-10 DIAGNOSIS — I498 Other specified cardiac arrhythmias: Secondary | ICD-10-CM | POA: Diagnosis not present

## 2018-08-10 DIAGNOSIS — M109 Gout, unspecified: Secondary | ICD-10-CM

## 2018-08-10 DIAGNOSIS — R0602 Shortness of breath: Secondary | ICD-10-CM | POA: Diagnosis present

## 2018-08-10 DIAGNOSIS — Z6841 Body Mass Index (BMI) 40.0 and over, adult: Secondary | ICD-10-CM

## 2018-08-10 DIAGNOSIS — Z79899 Other long term (current) drug therapy: Secondary | ICD-10-CM | POA: Diagnosis not present

## 2018-08-10 DIAGNOSIS — Z7984 Long term (current) use of oral hypoglycemic drugs: Secondary | ICD-10-CM

## 2018-08-10 DIAGNOSIS — I4891 Unspecified atrial fibrillation: Secondary | ICD-10-CM

## 2018-08-10 DIAGNOSIS — I481 Persistent atrial fibrillation: Secondary | ICD-10-CM | POA: Diagnosis present

## 2018-08-10 DIAGNOSIS — N182 Chronic kidney disease, stage 2 (mild): Secondary | ICD-10-CM | POA: Diagnosis present

## 2018-08-10 DIAGNOSIS — Z8249 Family history of ischemic heart disease and other diseases of the circulatory system: Secondary | ICD-10-CM

## 2018-08-10 DIAGNOSIS — I509 Heart failure, unspecified: Secondary | ICD-10-CM | POA: Diagnosis not present

## 2018-08-10 DIAGNOSIS — I480 Paroxysmal atrial fibrillation: Secondary | ICD-10-CM | POA: Diagnosis present

## 2018-08-10 DIAGNOSIS — I272 Pulmonary hypertension, unspecified: Secondary | ICD-10-CM | POA: Diagnosis present

## 2018-08-10 DIAGNOSIS — J811 Chronic pulmonary edema: Secondary | ICD-10-CM | POA: Diagnosis not present

## 2018-08-10 DIAGNOSIS — I5033 Acute on chronic diastolic (congestive) heart failure: Secondary | ICD-10-CM | POA: Diagnosis present

## 2018-08-10 DIAGNOSIS — I110 Hypertensive heart disease with heart failure: Secondary | ICD-10-CM | POA: Diagnosis not present

## 2018-08-10 DIAGNOSIS — E1122 Type 2 diabetes mellitus with diabetic chronic kidney disease: Secondary | ICD-10-CM | POA: Diagnosis present

## 2018-08-10 LAB — GLUCOSE, CAPILLARY: Glucose-Capillary: 146 mg/dL — ABNORMAL HIGH (ref 70–99)

## 2018-08-10 LAB — CBC WITH DIFFERENTIAL/PLATELET
Basophils Absolute: 0 10*3/uL (ref 0.0–0.1)
Basophils Relative: 0 %
EOS PCT: 0 %
Eosinophils Absolute: 0 10*3/uL (ref 0.0–0.7)
HCT: 31.7 % — ABNORMAL LOW (ref 39.0–52.0)
Hemoglobin: 10.7 g/dL — ABNORMAL LOW (ref 13.0–17.0)
LYMPHS PCT: 16 %
Lymphs Abs: 1.2 10*3/uL (ref 0.7–4.0)
MCH: 27.3 pg (ref 26.0–34.0)
MCHC: 33.8 g/dL (ref 30.0–36.0)
MCV: 80.9 fL (ref 78.0–100.0)
MONO ABS: 0.6 10*3/uL (ref 0.1–1.0)
Monocytes Relative: 8 %
Neutro Abs: 5.9 10*3/uL (ref 1.7–7.7)
Neutrophils Relative %: 76 %
PLATELETS: 262 10*3/uL (ref 150–400)
RBC: 3.92 MIL/uL — ABNORMAL LOW (ref 4.22–5.81)
RDW: 13.4 % (ref 11.5–15.5)
WBC: 7.8 10*3/uL (ref 4.0–10.5)

## 2018-08-10 LAB — ECHOCARDIOGRAM COMPLETE
Height: 68 in
Weight: 4733.72 oz

## 2018-08-10 LAB — POCT I-STAT, CHEM 8
BUN: 18 mg/dL (ref 6–20)
CREATININE: 1 mg/dL (ref 0.61–1.24)
Calcium, Ion: 1.16 mmol/L (ref 1.15–1.40)
Chloride: 105 mmol/L (ref 98–111)
Glucose, Bld: 156 mg/dL — ABNORMAL HIGH (ref 70–99)
HCT: 31 % — ABNORMAL LOW (ref 39.0–52.0)
Hemoglobin: 10.5 g/dL — ABNORMAL LOW (ref 13.0–17.0)
Potassium: 3.9 mmol/L (ref 3.5–5.1)
Sodium: 139 mmol/L (ref 135–145)
TCO2: 26 mmol/L (ref 22–32)

## 2018-08-10 LAB — POCT I-STAT TROPONIN I: Troponin i, poc: 0.02 ng/mL (ref 0.00–0.08)

## 2018-08-10 LAB — MRSA PCR SCREENING: MRSA BY PCR: NEGATIVE

## 2018-08-10 LAB — BRAIN NATRIURETIC PEPTIDE: B Natriuretic Peptide: 864.7 pg/mL — ABNORMAL HIGH (ref 0.0–100.0)

## 2018-08-10 MED ORDER — FUROSEMIDE 10 MG/ML IJ SOLN: 40.0000 mg | Freq: Two times a day (BID) | INTRAMUSCULAR | Status: DC

## 2018-08-10 MED ORDER — ACETAMINOPHEN 325 MG PO TABS: 650.0000 mg | ORAL_TABLET | ORAL | Status: DC | PRN

## 2018-08-10 MED ORDER — DILTIAZEM HCL ER COATED BEADS 120 MG PO CP24
120.0000 mg | ORAL_CAPSULE | Freq: Every day | ORAL | Status: DC
  Administered 2018-08-10 – 2018-08-11 (×2): 120 mg via ORAL
  Filled 2018-08-10 (×3): qty 1

## 2018-08-10 MED ORDER — SODIUM CHLORIDE 0.9% FLUSH
3.0000 mL | Freq: Two times a day (BID) | INTRAVENOUS | Status: DC
  Administered 2018-08-10 – 2018-08-14 (×8): 3 mL via INTRAVENOUS

## 2018-08-10 MED ORDER — ALBUTEROL SULFATE (2.5 MG/3ML) 0.083% IN NEBU: 3.0000 mL | INHALATION_SOLUTION | Freq: Four times a day (QID) | RESPIRATORY_TRACT | Status: DC | PRN

## 2018-08-10 MED ORDER — FUROSEMIDE 10 MG/ML IJ SOLN
40.0000 mg | Freq: Every day | INTRAMUSCULAR | Status: DC
  Administered 2018-08-10: 40 mg via INTRAVENOUS
  Filled 2018-08-10: qty 4

## 2018-08-10 MED ORDER — SODIUM CHLORIDE 0.9 % IV SOLN: 250.0000 mL | INTRAVENOUS | Status: DC | PRN

## 2018-08-10 MED ORDER — OXYCODONE HCL 5 MG PO TABS
5.0000 mg | ORAL_TABLET | ORAL | Status: DC | PRN
  Administered 2018-08-10 – 2018-08-14 (×5): 5 mg via ORAL
  Filled 2018-08-10 (×5): qty 1

## 2018-08-10 MED ORDER — METOPROLOL TARTRATE 25 MG PO TABS: 37.5000 mg | ORAL_TABLET | Freq: Two times a day (BID) | ORAL | Status: DC

## 2018-08-10 MED ORDER — FUROSEMIDE 10 MG/ML IJ SOLN
40.0000 mg | Freq: Three times a day (TID) | INTRAMUSCULAR | Status: DC
  Administered 2018-08-10 – 2018-08-11 (×3): 40 mg via INTRAVENOUS
  Filled 2018-08-10 (×3): qty 4

## 2018-08-10 MED ORDER — OXYCODONE-ACETAMINOPHEN 10-325 MG PO TABS: 1.0000 | ORAL_TABLET | ORAL | Status: DC | PRN

## 2018-08-10 MED ORDER — RIVAROXABAN 20 MG PO TABS
20.0000 mg | ORAL_TABLET | Freq: Every day | ORAL | Status: DC
  Administered 2018-08-10 – 2018-08-14 (×5): 20 mg via ORAL
  Filled 2018-08-10 (×5): qty 1

## 2018-08-10 MED ORDER — SODIUM CHLORIDE 0.9% FLUSH: 3.0000 mL | INTRAVENOUS | Status: DC | PRN

## 2018-08-10 MED ORDER — FUROSEMIDE 10 MG/ML IJ SOLN: 40.0000 mg | Freq: Every day | INTRAMUSCULAR | Status: DC

## 2018-08-10 MED ORDER — ALBUTEROL SULFATE (2.5 MG/3ML) 0.083% IN NEBU
5.0000 mg | INHALATION_SOLUTION | Freq: Once | RESPIRATORY_TRACT | Status: AC
  Administered 2018-08-10: 5 mg via RESPIRATORY_TRACT
  Filled 2018-08-10: qty 6

## 2018-08-10 MED ORDER — DILTIAZEM LOAD VIA INFUSION
20.0000 mg | Freq: Once | INTRAVENOUS | Status: AC
  Administered 2018-08-10: 20 mg via INTRAVENOUS
  Filled 2018-08-10: qty 20

## 2018-08-10 MED ORDER — DILTIAZEM HCL-DEXTROSE 100-5 MG/100ML-% IV SOLN (PREMIX)
5.0000 mg/h | INTRAVENOUS | Status: DC
  Administered 2018-08-10: 15 mg/h via INTRAVENOUS
  Administered 2018-08-10: 5 mg/h via INTRAVENOUS
  Administered 2018-08-11: 15 mg/h via INTRAVENOUS
  Filled 2018-08-10 (×5): qty 100

## 2018-08-10 MED ORDER — METOPROLOL TARTRATE 25 MG PO TABS
25.0000 mg | ORAL_TABLET | Freq: Two times a day (BID) | ORAL | Status: DC
  Administered 2018-08-10: 25 mg via ORAL
  Filled 2018-08-10: qty 1

## 2018-08-10 MED ORDER — BENAZEPRIL HCL 20 MG PO TABS
20.0000 mg | ORAL_TABLET | Freq: Every day | ORAL | Status: DC
  Administered 2018-08-10 – 2018-08-14 (×5): 20 mg via ORAL
  Filled 2018-08-10 (×5): qty 1

## 2018-08-10 MED ORDER — METOPROLOL TARTRATE 50 MG PO TABS
50.0000 mg | ORAL_TABLET | Freq: Two times a day (BID) | ORAL | Status: DC
  Administered 2018-08-10 – 2018-08-11 (×3): 50 mg via ORAL
  Filled 2018-08-10 (×3): qty 1

## 2018-08-10 MED ORDER — ONDANSETRON HCL 4 MG/2ML IJ SOLN: 4.0000 mg | Freq: Four times a day (QID) | INTRAMUSCULAR | Status: DC | PRN

## 2018-08-10 MED ORDER — TIZANIDINE HCL 4 MG PO TABS
4.0000 mg | ORAL_TABLET | Freq: Three times a day (TID) | ORAL | Status: DC | PRN
  Administered 2018-08-11 (×2): 4 mg via ORAL
  Filled 2018-08-10 (×2): qty 1

## 2018-08-10 MED ORDER — PERFLUTREN LIPID MICROSPHERE
1.0000 mL | INTRAVENOUS | Status: AC | PRN
  Filled 2018-08-10: qty 10

## 2018-08-10 MED ORDER — OXYCODONE-ACETAMINOPHEN 5-325 MG PO TABS
1.0000 | ORAL_TABLET | ORAL | Status: DC | PRN
  Administered 2018-08-10 – 2018-08-12 (×5): 1 via ORAL
  Filled 2018-08-10 (×5): qty 1

## 2018-08-10 NOTE — Progress Notes (Signed)
  Echocardiogram 2D Echocardiogram has been performed.  Kaydie Petsch G Jaishon Krisher 08/10/2018, 9:08 AM

## 2018-08-10 NOTE — Progress Notes (Signed)
Pt. Transported to step-down without difficulties.  Bipap plugged into red outlet and 02 hooked to 50 psi.  Report given to receiving RT.

## 2018-08-10 NOTE — Progress Notes (Signed)
Juan Snyder is a 52 y.o. male with medical history significant of  chronic A.Fib on Xarelto, chronic systolic CHF with EF 45%, type II DM diet controlled, HTN.  Patient presents to the ED with cc of SOB, orthopnea, DOE.  Symptoms onset gradually and worsened over the past couple of weeks.  Worse when laying down.  Only minimal BLE swelling.  Significant orthopnea.  Patient had cardioversion earlier this year.  Went back into A.Fib, is currently on Xarelto and has another cardioversion scheduled in 3 days on 9/25 with Dr. Jacinto Halim.  08/10/2018: Patient seen and examined with his wife at his bedside.  He is sitting up in the bed and reports orthopnea.  Denies palpitations.  Spoke with Dr. Jacinto Halim who reviewed the 2D echo done this morning.  LVEF is severely reduced compared to the previous one.  Recommended patient being transferred to [facility] for possible left heart cath and cardioversion.  Patient to be transferred to [facility] stepdown unit.  Please refer to H&P dictated on 08/10/2018 by Dr. Julian Reil for further details of the assessment and plan.

## 2018-08-10 NOTE — ED Notes (Signed)
ED TO INPATIENT HANDOFF REPORT  Name/Age/Gender Juan Snyder 52 y.o. male  Code Status    Code Status Orders  (From admission, onward)         Start     Ordered   08/10/18 0447  Full code  Continuous     08/10/18 0449        Code Status History    This patient has a current code status but no historical code status.      Home/SNF/Other Home  Chief Complaint problems breathing; left foot pain  Level of Care/Admitting Diagnosis ED Disposition    ED Disposition Condition Comment   Admit  Hospital Area: [facility] [[id number]]  Level of Care: Stepdown [14]  Admit to SDU based on following criteria: Respiratory Distress:  Frequent assessment and/or intervention to maintain adequate ventilation/respiration, pulmonary toilet, and respiratory treatment.  Admit to SDU based on following criteria: Cardiac Instability:  Patients experiencing chest pain, unconfirmed MI and stable, arrhythmias and CHF requiring medical management and potentially compromising patient's stability  Admit to SDU based on following criteria: Other see comments  Comments: A.Fib RVR, CHF, on BIPAP, and cardizem gtt  Diagnosis: Acute on chronic diastolic CHF (congestive heart failure) [facility]) [161096]  Admitting Physician: Hillary Bow [phone]  Attending Physician: Hillary Bow [[id number]  PT Class (Do Not Modify): Observation [104]  PT Acc Code (Do Not Modify): Observation [10022]       Medical History Past Medical History:  Diagnosis Date  . Back pain, chronic   . CHF (congestive heart failure) (HCC)   . Diabetes mellitus   . Hypertension   . Obesity     Allergies No Known Allergies  IV Location/Drains/Wounds Patient Lines/Drains/Airways Status   Active Line/Drains/Airways    Name:   Placement date:   Placement time:   Site:   Days:   Peripheral IV 02/18/18 Left;Anterior Forearm   02/18/18    1236    Forearm   173   Peripheral IV 08/10/18 Right Antecubital    08/10/18    0344    Antecubital   less than 1          Labs/Imaging Results for orders placed or performed during the hospital encounter of 08/10/18 (from the past 48 hour(s))  CBC with Differential/Platelet     Status: Abnormal   Collection Time: 08/10/18  3:44 AM  Result Value Ref Range   WBC 7.8 4.0 - 10.5 K/uL   RBC 3.92 (L) 4.22 - 5.81 MIL/uL   Hemoglobin 10.7 (L) 13.0 - 17.0 g/dL   HCT 09.8 (L) 11.9 - 14.7 %   MCV 80.9 78.0 - 100.0 fL   MCH 27.3 26.0 - 34.0 pg   MCHC 33.8 30.0 - 36.0 g/dL   RDW 82.9 56.2 - 13.0 %   Platelets 262 150 - 400 K/uL   Neutrophils Relative % 76 %   Neutro Abs 5.9 1.7 - 7.7 K/uL   Lymphocytes Relative 16 %   Lymphs Abs 1.2 0.7 - 4.0 K/uL   Monocytes Relative 8 %   Monocytes Absolute 0.6 0.1 - 1.0 K/uL   Eosinophils Relative 0 %   Eosinophils Absolute 0.0 0.0 - 0.7 K/uL   Basophils Relative 0 %   Basophils Absolute 0.0 0.0 - 0.1 K/uL    Comment: Performed at [facility], [address], [place], [place] [address]  Brain natriuretic peptide     Status: Abnormal   Collection Time: 08/10/18  3:44  AM  Result Value Ref Range   B Natriuretic Peptide 864.7 (H) 0.0 - 100.0 pg/mL    Comment: Performed at [facility], [address], [place], [place] [address]  POCT i-Stat troponin I     Status: None   Collection Time: 08/10/18  3:49 AM  Result Value Ref Range   Troponin i, poc 0.02 0.00 - 0.08 ng/mL   Comment 3            Comment: Due to the release kinetics of cTnI, a negative result within the first hours of the onset of symptoms does not rule out myocardial infarction with certainty. If myocardial infarction is still suspected, repeat the test at appropriate intervals.   I-STAT, chem 8     Status: Abnormal   Collection Time: 08/10/18  3:52 AM  Result Value Ref Range   Sodium 139 135 - 145 mmol/L   Potassium 3.9 3.5 - 5.1 mmol/L   Chloride 105 98 - 111 mmol/L   BUN 18 6 - 20 mg/dL   Creatinine, Ser  8.75 0.61 - 1.24 mg/dL   Glucose, Bld 643 (H) 70 - 99 mg/dL   Calcium, Ion 3.29 5.18 - 1.40 mmol/L   TCO2 26 22 - 32 mmol/L   Hemoglobin 10.5 (L) 13.0 - 17.0 g/dL   HCT 84.1 (L) 66.0 - 63.0 %   Dg Chest 2 View  Result Date: 08/10/2018 CLINICAL DATA:  Worsening dyspnea. EXAM: CHEST - 2 VIEW COMPARISON:  04/25/2012 FINDINGS: Cardiomegaly with aortic atherosclerosis. Interval increase in interstitial edema and central vascular congestion since prior. Osteoarthritis of the AC joints bilaterally with undersurface spurring. No acute nor suspicious osseous abnormalities. IMPRESSION: Cardiomegaly with interstitial pulmonary edema. Electronically Signed   By: Tollie Eth M.D.   On: 08/10/2018 03:17    Pending Labs Unresulted Labs (From admission, onward)    Start     Ordered   08/11/18 0500  Basic metabolic panel  Daily,   R     08/10/18 0449   08/10/18 0446  HIV antibody (Routine Testing)  Once,   R     08/10/18 0449          Vitals/Pain Today's Vitals   08/10/18 0221 08/10/18 0345 08/10/18 0442 08/10/18 0445  BP:  (!) 159/100 (!) 148/92 (!) 148/92  Pulse:  88 82 (!) 106  Resp:  (!) 25 (!) 23 18  Temp:      TempSrc:      SpO2:  98% 97% 100%  PainSc: 10-Worst pain ever       Isolation Precautions No active isolations  Medications Medications  diltiazem (CARDIZEM) 1 mg/mL load via infusion 20 mg (20 mg Intravenous Bolus from Bag 08/10/18 0509)    And  diltiazem (CARDIZEM) 100 mg in dextrose 5% (1 mg/mL) infusion (5 mg/hr Intravenous New Bag/Given 08/10/18 0509)  sodium chloride flush (NS) 0.9 % injection 3 mL (has no administration in time range)  sodium chloride flush (NS) 0.9 % injection 3 mL (has no administration in time range)  0.9 %  sodium chloride infusion (has no administration in time range)  acetaminophen (TYLENOL) tablet 650 mg (has no administration in time range)  ondansetron (ZOFRAN) injection 4 mg (has no administration in time range)  furosemide (LASIX)  injection 40 mg (has no administration in time range)  albuterol (PROVENTIL) (2.5 MG/3ML) 0.083% nebulizer solution 5 mg (5 mg Nebulization Given 08/10/18 0337)    Mobility walks with device (cane)

## 2018-08-10 NOTE — Progress Notes (Signed)
Placed patient on dream station with Bipap settings of 14/5.  Blended in 3L O2.  Patient sats 95 to 96%.  Patient resting comfortably.

## 2018-08-10 NOTE — Consult Note (Signed)
Reason for Consult: Shortness of breath Referring Physician: [facility]  Juan Snyder is an 52 y.o. male.  HPI:   52 year old African-American male with hypertension, type II diabetes mellitus, morbid obesity, obstructive sleep apnea on CPAP, paroxysmal Afib/flutter, now admitted to [facility] with shortness of breath.  I saw patient in the office on 07/30/2018. Patient had successfully undergone cardioversion for A. fib/flutter and 03/08/2018 following which he had maintained sinus rhythm until earlier this month. Given his intermittent compliance with Xarelto, I have recommended him 3 weeks of uninterrupted Xarelto use before cardioversion scheduled for 08/13/2018. I also discussed with him referral for ablation versus antiarrhythmic therapy. Patient had not made decision regarding the same yet. At baseline, his EF is 66% as seen on outside echocardiogram in 09/2017. Patient had called our office late last week complaining of shortness of breath. Had increased his Lasix dose and recommended cardioversion as scheduled.  Patient was admitted to [facility] earlier today with worsening shortness of breath. Workup is significant for echocardiogram now showing global diffuse hypokinesis with EF of 20%, and moderate pulmonary hypertension. BNP is elevated at 864. Patient has mild anemia in the setting of volume overload. Troponin is negative. Cardiology was consulted.  Prior to hospital admission, patient was on metoprolol titrate 25 mg twice daily, benazepril 20 mg once daily, diltiazem 120 mg once daily, furosemide 40 mg twice daily, Xarelto 20 mg once daily.  Past Medical History:  Diagnosis Date  . Back pain, chronic   . CHF (congestive heart failure) (HCC)   . Diabetes mellitus   . Hypertension   . Obesity     Past Surgical History:  Procedure Laterality Date  . CARDIOVERSION N/A 02/18/2018   Procedure: CARDIOVERSION;  Surgeon: Elder Negus, MD;   Location: [facility] ENDOSCOPY;  Service: Cardiovascular;  Laterality: N/A;    Family History  Problem Relation Age of Onset  . Hypertension Mother     Social History:  reports that he has never smoked. He does not have any smokeless tobacco history on file. He reports that he drinks alcohol. He reports that he does not use drugs.  Allergies: No Known Allergies  Medications: I have reviewed the patient's current medications.  Results for orders placed or performed during the hospital encounter of 08/10/18 (from the past 48 hour(s))  CBC with Differential/Platelet     Status: Abnormal   Collection Time: 08/10/18  3:44 AM  Result Value Ref Range   WBC 7.8 4.0 - 10.5 K/uL   RBC 3.92 (L) 4.22 - 5.81 MIL/uL   Hemoglobin 10.7 (L) 13.0 - 17.0 g/dL   HCT 24.5 (L) 80.9 - 98.3 %   MCV 80.9 78.0 - 100.0 fL   MCH 27.3 26.0 - 34.0 pg   MCHC 33.8 30.0 - 36.0 g/dL   RDW 38.2 50.5 - 39.7 %   Platelets 262 150 - 400 K/uL   Neutrophils Relative % 76 %   Neutro Abs 5.9 1.7 - 7.7 K/uL   Lymphocytes Relative 16 %   Lymphs Abs 1.2 0.7 - 4.0 K/uL   Monocytes Relative 8 %   Monocytes Absolute 0.6 0.1 - 1.0 K/uL   Eosinophils Relative 0 %   Eosinophils Absolute 0.0 0.0 - 0.7 K/uL   Basophils Relative 0 %   Basophils Absolute 0.0 0.0 - 0.1 K/uL    Comment: Performed at [facility], [address] [place], [place] [address]  Brain natriuretic peptide     Status:  Abnormal   Collection Time: 08/10/18  3:44 AM  Result Value Ref Range   B Natriuretic Peptide 864.7 (H) 0.0 - 100.0 pg/mL    Comment: Performed at [facility], [address], [place], [place] [address]  POCT i-Stat troponin I     Status: None   Collection Time: 08/10/18  3:49 AM  Result Value Ref Range   Troponin i, poc 0.02 0.00 - 0.08 ng/mL   Comment 3            Comment: Due to the release kinetics of cTnI, a negative result within the first hours of the onset of symptoms does not rule  out myocardial infarction with certainty. If myocardial infarction is still suspected, repeat the test at appropriate intervals.   I-STAT, chem 8     Status: Abnormal   Collection Time: 08/10/18  3:52 AM  Result Value Ref Range   Sodium 139 135 - 145 mmol/L   Potassium 3.9 3.5 - 5.1 mmol/L   Chloride 105 98 - 111 mmol/L   BUN 18 6 - 20 mg/dL   Creatinine, Ser 4.78 0.61 - 1.24 mg/dL   Glucose, Bld 295 (H) 70 - 99 mg/dL   Calcium, Ion 6.21 3.08 - 1.40 mmol/L   TCO2 26 22 - 32 mmol/L   Hemoglobin 10.5 (L) 13.0 - 17.0 g/dL   HCT 65.7 (L) 84.6 - 96.2 %  MRSA PCR Screening     Status: None   Collection Time: 08/10/18  6:23 AM  Result Value Ref Range   MRSA by PCR NEGATIVE NEGATIVE    Comment:        The GeneXpert MRSA Assay (FDA approved for NASAL specimens only), is one component of a comprehensive MRSA colonization surveillance program. It is not intended to diagnose MRSA infection nor to guide or monitor treatment for MRSA infections. Performed at [facility], [address] [place], [place] [address]     Dg Chest 2 View  Result Date: 08/10/2018 CLINICAL DATA:  Worsening dyspnea. EXAM: CHEST - 2 VIEW COMPARISON:  04/25/2012 FINDINGS: Cardiomegaly with aortic atherosclerosis. Interval increase in interstitial edema and central vascular congestion since prior. Osteoarthritis of the AC joints bilaterally with undersurface spurring. No acute nor suspicious osseous abnormalities. IMPRESSION: Cardiomegaly with interstitial pulmonary edema. Electronically Signed   By: Tollie Eth M.D.   On: 08/10/2018 03:17    Cardiac studies: EKG 08/10/2018: Atrial fibrilation with rapid ventricular response Normal axis. Incomplete right bundle branch block. Nonspecific ST-T changes.  Hospital echocardiogram 08/10/2018: Study Conclusions  - Left ventricle: The cavity size was normal. Wall thickness was   increased in a pattern of mild LVH. Systolic function was    severely reduced. The estimated ejection fraction was 20%.   Diffuse hypokinesis. - Mitral valve: Calcified annulus. There was moderate   regurgitation. - Left atrium: The atrium was mildly dilated. - Atrial septum: No defect or patent foramen ovale was identified. - Pulmonary arteries: PA peak pressure: 43 mm Hg (S).  Impressions:  - The right ventricular systolic pressure was increased consistent   with moderate pulmonary hypertension.  Echocardiogram 10/04/2017: Poor visualization.  Mild RV dilatation.  EF 66%. Normal RV size contractility.   Mild left atrial dilatation. No significant valvular abnormalities. RVSP 27 mmHg.    Review of Systems  Constitutional: Positive for malaise/fatigue. Negative for chills and fever.       Weight gain  HENT: Negative.   Respiratory: Positive for shortness of breath. Negative  for cough, hemoptysis and sputum production.   Cardiovascular: Positive for palpitations, orthopnea, leg swelling and PND.       Chest pressure only with shortness of breath. No exertional chest pain.   Gastrointestinal: Negative.   Genitourinary:       Pink urine.  Musculoskeletal:       Endorses hitting his right great toe against a coffee table. Complains of pain.  Skin: Negative.   Neurological: Negative for loss of consciousness and headaches.  Endo/Heme/Allergies: Does not bruise/bleed easily.  Psychiatric/Behavioral: Negative.   All other systems reviewed and are negative.  Blood pressure (!) 142/115, pulse (!) 102, temperature 98.3 F (36.8 C), temperature source Axillary, resp. rate (!) 23, height 5\' 8"  (1.727 m), weight 134.2 kg, SpO2 100 %. Physical Exam  Nursing note and vitals reviewed. Constitutional: He is oriented to person, place, and time. He appears well-developed and well-nourished. No distress.  HENT:  Head: Normocephalic and atraumatic.  Eyes: Pupils are equal, round, and reactive to light. Conjunctivae are normal.  Neck: Normal range  of motion. Neck supple. JVD present.  Cardiovascular: Intact distal pulses. An irregularly irregular rhythm present. Tachycardia present.  No murmur heard. Respiratory: No accessory muscle usage. Tachypnea noted. He has decreased breath sounds in the right lower field and the left lower field.  GI: Soft. Bowel sounds are normal. He exhibits distension. There is no tenderness. There is no rebound and no guarding.  Musculoskeletal: He exhibits edema (2+ b/l) and tenderness (Rt great tioe tenderness. No erythema, warmth).  Lymphadenopathy:    He has no cervical adenopathy.  Neurological: He is alert and oriented to person, place, and time.  Skin: Skin is warm and dry.  Psychiatric: He has a normal mood and affect.    Assessment:  52 year old African-American male with hypertension, type II diabetes mellitus, morbid obesity, obstructive sleep apnea on CPAP, paroxysmal Afib/flutter, now admitted to [facility] with shortness of breath.  Acute systolic heart failure, NYHA class IV HFrEF:     EF 20%, EF 65% in 09/2017. Etiology includes arrhythmia induced cardiomyopathy due to Afib, ischemic or nonischemic cardiomyopathy Afib with RVR:     Currently on dilatiazem drip at 12.5 mg/hr, diltiazem PO 120 mg, and metoprolol 25 mg PO bid. Not hypotensive Hypertension:       Suboptimal. On above meds, plus benazepril 20 mg daily Type 2 DM:        Metformin at home. On sliding scale here Morbid obesity OSA on CPAP Rt foot pain  Recommendation:  His A. fib RVR is most likely etiology of his new systolic heart failure. Management strategy would include restoration of sinus rhythm, aggressive diuresis, as well as evaluation for ischemic cardiomyopathy. I recommend transfer to [facility]. I will discuss this with hospitalist Dr. Margo Aye.  I generally like to avoid diltiazem in the setting of low LVEF due to its negative inotropic properties.. However, patient's rate is currently fairly  controlled without hypotension, on diltiazem 12.5 mg/hr and 120 mg by mouth daily. Stopping diltiazem at once could increase his rapid ventricular rate.  Recommend aggressive diuresis with IV Lasix 40 mg 3 times a day. Increase metoprolol by mouth to 50 mg twice daily (both ordered), and wean off diltiazem as tolerated. May continue oral diltiazem for now. If IV agent is required for rate control, he would need amiodarone. Continue xarelto for anticoagulation. While his urine is pinkish, I would wait a little longer before working up for hematuria. Continue benazepril for  hypertension.  I will plan on performing a right and left heart catheterization on Tuesday 09/24, followed by cardioversion as scheduled on 09/25. Given his low LVEF, his only choice for antiarrhythmic therapy is amiodarone. I generally avoid amiodarone in younger patients due to its side effects. I think patient will most benefit from radiofrequency ablation. I have encouraged patient to consider this option. He is going to speak with his girlfriend Charlynne Pander and make a decision. I will discussed with EP regarding same.  I do not thing his foot pain is gout, and absence of local erythema or warmth. It is possible that it is combination of a blunt injury he sustained, along with significant edema due to volume overload. Could consider x-ray to rule out any bony injury.  Recommend CPAP for OSA.  Addalyn Speedy J Kista Robb 08/10/2018, 10:19 AM   Jaiven Graveline Emiliano Dyer, MD [facility] Pager: [phone] Office: [phone] If no answer Cell [phone]

## 2018-08-10 NOTE — ED Triage Notes (Addendum)
Pt reports increased shortness of breath since being started on metoprolol by his Cardiologist on Friday. He reports that he recently "slipped back in to a fib," which is what prompted the medication change. He endorses SOB at rest and with exertion and lying down.  Pt also reporting L foot and L big toe pain since Tuesday. Slight swelling noted to L 2nd toe. A&Ox4. Ambulatory.

## 2018-08-10 NOTE — ED Notes (Signed)
Bed: WA11 Expected date:  Expected time:  Means of arrival:  Comments: 

## 2018-08-10 NOTE — H&P (Addendum)
History and Physical    Juan Snyder ZOX:[id number] DOB: [birth year] DOA: 08/10/2018  PCP: Jackie Plum, MD  Patient coming from: Home  I have personally briefly reviewed patient's old medical records in [facility]  Chief Complaint: SOB  HPI: Juan Snyder is a 52 y.o. male with medical history significant of A.Fib, CHF, DM diet controlled, HTN.  Patient presents to the ED with cc of SOB, orthopnea, DOE.  Symptoms onset gradually and worsened over the past couple of weeks.  Worse when laying down.  Only minimal BLE swelling.  Significant orthopnea.  Patient had cardioversion earlier this year.  Went back into A.Fib, is currently on Xarelto and has another cardioversion scheduled in 3 days in fact on 9/25 with Dr. Jacinto Halim.   ED Course: CXR shows pulm edema.  Patient put on BIPAP with significant improvement to breathing.  Cardizem bolus and GTT has been ordered by EDP and hospitalist asked to admit.   Review of Systems: As per HPI otherwise 10 point review of systems negative.   Past Medical History:  Diagnosis Date  . Back pain, chronic   . CHF (congestive heart failure) (HCC)   . Diabetes mellitus   . Hypertension   . Obesity     Past Surgical History:  Procedure Laterality Date  . CARDIOVERSION N/A 02/18/2018   Procedure: CARDIOVERSION;  Surgeon: Elder Negus, MD;  Location: [facility];  Service: Cardiovascular;  Laterality: N/A;     reports that he has never smoked. He does not have any smokeless tobacco history on file. He reports that he drinks alcohol. He reports that he does not use drugs.  No Known Allergies  Family History  Problem Relation Age of Onset  . Hypertension Mother      Prior to Admission medications   Medication Sig Start Date End Date Taking? Authorizing Provider  albuterol (PROVENTIL HFA;VENTOLIN HFA) 108 (90 Base) MCG/ACT inhaler Inhale 1 puff into the lungs every 6 (six) hours as needed for wheezing or shortness of  breath.   Yes [provider]  benazepril (LOTENSIN) 20 MG tablet Take 20 mg by mouth daily.   Yes [provider]  diltiazem (DILACOR XR) 120 MG 24 hr capsule Take 120 mg by mouth daily.   Yes [provider]  furosemide (LASIX) 40 MG tablet Take 40 mg by mouth daily.   Yes [provider]  metFORMIN (GLUCOPHAGE) 500 MG tablet Take 500-1,000 mg by mouth See admin instructions. 1,000 mg every morning, 500 mg every night   Yes [provider]  metoprolol tartrate (LOPRESSOR) 25 MG tablet Take 25 mg by mouth 2 (two) times daily. 07/30/18  Yes [provider]  Multiple Vitamins-Minerals (MULTIVITAMIN PO) Take 1 tablet by mouth daily.   Yes [provider]  oxyCODONE-acetaminophen (PERCOCET) 10-325 MG tablet Take 1 tablet by mouth every 4 (four) hours as needed for pain.   Yes [provider]  rivaroxaban (XARELTO) 20 MG TABS tablet Take 20 mg by mouth daily.    Yes [provider]  sildenafil (VIAGRA) 50 MG tablet Take 50 mg by mouth daily as needed for erectile dysfunction.   Yes [provider]  terazosin (HYTRIN) 5 MG capsule Take 5 mg by mouth every evening.    Yes [provider]  tiZANidine (ZANAFLEX) 4 MG tablet Take 4 mg by mouth every 8 (eight) hours as needed for muscle spasms.  08/07/18  Yes [provider]    Physical Exam: Vitals:  08/10/18 0210 08/10/18 0345 08/10/18 0442 08/10/18 0445  BP: (!) 165/110 (!) 159/100 (!) 148/92 (!) 148/92  Pulse: (!) 111 88 82 (!) 106  Resp: (!) 22 (!) 25 (!) 23 18  Temp: 99.1 F (37.3 C)     TempSrc: Oral     SpO2: 93% 98% 97% 100%    Constitutional: NAD, calm, comfortable Eyes: PERRL, lids and conjunctivae normal ENMT: Mucous membranes are moist. Posterior pharynx clear of any exudate or lesions.Normal dentition.  Neck: normal, supple, no masses, no thyromegaly Respiratory: B crackles Cardiovascular: IRR, IRR, rate 120s-130s.  Trace BLE  edema. Abdomen: no tenderness, no masses palpated. No hepatosplenomegaly. Bowel sounds positive.  Musculoskeletal: no clubbing / cyanosis. No joint deformity upper and lower extremities. Good ROM, no contractures. Normal muscle tone.  Skin: no rashes, lesions, ulcers. No induration Neurologic: CN 2-12 grossly intact. Sensation intact, DTR normal. Strength 5/5 in all 4.  Psychiatric: Normal judgment and insight. Alert and oriented x 3. Normal mood.    Labs on Admission: I have personally reviewed following labs and imaging studies  CBC: Recent Labs  Lab 08/10/18 0344 08/10/18 0352  WBC 7.8  --   NEUTROABS 5.9  --   HGB 10.7* 10.5*  HCT 31.7* 31.0*  MCV 80.9  --   PLT 262  --    Basic Metabolic Panel: Recent Labs  Lab 08/10/18 0352  NA 139  K 3.9  CL 105  GLUCOSE 156*  BUN 18  CREATININE 1.00   GFR: CrCl cannot be calculated (Unknown ideal weight.). Liver Function Tests: No results for input(s): AST, ALT, ALKPHOS, BILITOT, PROT, ALBUMIN in the last 168 hours. No results for input(s): LIPASE, AMYLASE in the last 168 hours. No results for input(s): AMMONIA in the last 168 hours. Coagulation Profile: No results for input(s): INR, PROTIME in the last 168 hours. Cardiac Enzymes: No results for input(s): CKTOTAL, CKMB, CKMBINDEX, TROPONINI in the last 168 hours. BNP (last 3 results) No results for input(s): PROBNP in the last 8760 hours. HbA1C: No results for input(s): HGBA1C in the last 72 hours. CBG: No results for input(s): GLUCAP in the last 168 hours. Lipid Profile: No results for input(s): CHOL, HDL, LDLCALC, TRIG, CHOLHDL, LDLDIRECT in the last 72 hours. Thyroid Function Tests: No results for input(s): TSH, T4TOTAL, FREET4, T3FREE, THYROIDAB in the last 72 hours. Anemia Panel: No results for input(s): VITAMINB12, FOLATE, FERRITIN, TIBC, IRON, RETICCTPCT in the last 72 hours. Urine analysis:    Component Value Date/Time   COLORURINE YELLOW 08/18/2009 1913    APPEARANCEUR CLEAR 08/18/2009 1913   LABSPEC 1.019 08/18/2009 1913   PHURINE 5.0 08/18/2009 1913   GLUCOSEU NEGATIVE 08/18/2009 1913   HGBUR NEGATIVE 08/18/2009 1913   BILIRUBINUR NEGATIVE 08/18/2009 1913   KETONESUR NEGATIVE 08/18/2009 1913   PROTEINUR NEGATIVE 08/18/2009 1913   UROBILINOGEN 0.2 08/18/2009 1913   NITRITE NEGATIVE 08/18/2009 1913   LEUKOCYTESUR  08/18/2009 1913    NEGATIVE MICROSCOPIC NOT DONE ON URINES WITH NEGATIVE PROTEIN, BLOOD, LEUKOCYTES, NITRITE, OR GLUCOSE <1000 mg/dL.    Radiological Exams on Admission: Dg Chest 2 View  Result Date: 08/10/2018 CLINICAL DATA:  Worsening dyspnea. EXAM: CHEST - 2 VIEW COMPARISON:  04/25/2012 FINDINGS: Cardiomegaly with aortic atherosclerosis. Interval increase in interstitial edema and central vascular congestion since prior. Osteoarthritis of the AC joints bilaterally with undersurface spurring. No acute nor suspicious osseous abnormalities. IMPRESSION: Cardiomegaly with interstitial pulmonary edema. Electronically Signed   By: Tollie Eth M.D.   On: 08/10/2018 03:17  EKG: Independently reviewed.  Assessment/Plan Principal Problem:   Acute on chronic diastolic CHF (congestive heart failure) (HCC) Active Problems:   Essential hypertension, benign   Atrial fibrillation with RVR (HCC)    1. Acute on chronic diastolic CHF - suspect this probably rate mediated HF 1. CHF pathway 2. Lasix 40mg  IV daily, first dose now, may or may not need BID dosing depending on initial UOP vs response of CHF symptoms to rate control of A.Fib 3. Strict intake and output 4. BMP daily 5. Tele monitor 6. BIPAP initially 7. 2d echo ordered 8. Call Dr. Jacinto Halim in AM 2. A.Fib RVR - 1. Will put on cardizem bolus and gtt for rate control 2. Continue Xarelto 3. Continue home PO metoprolol and cardizem (hoping to wean gtt as able) 4. Call Dr. Jacinto Halim in AM, looks like he had patient scheduled for cardioversion on Wed anyhow. 3. HTN - Continue home BP  meds  DVT prophylaxis: Xarelto Code Status: Full Family Communication: Wife at bedside Disposition Plan: Home after admit Consults called: None, Call Dr. Jacinto Halim in AM Admission status: Place in obs   Czarina Gingras, [place]. DO [facility] Pager [phone] Only works nights!  If 7AM-7PM, please contact the primary day team physician taking care of patient  [email or web address] Password [facility]  08/10/2018, 5:16 AM

## 2018-08-10 NOTE — Progress Notes (Signed)
Pt. arrived by [facility], pt assessed, put on monitor, vitals taken, and educated on call bell and room. Called provider about patient arrival.

## 2018-08-10 NOTE — ED Provider Notes (Signed)
[facility]-EMERGENCY DEPT Provider Note   CSN: [id number] Arrival date & time: 08/10/18  0203     History   Chief Complaint Chief Complaint  Patient presents with  . Shortness of Breath  . Foot Pain    L    HPI Juan Snyder is a 52 y.o. male.  She with past medical history remarkable for A. fib, CHF, diabetes, hypertension, presents to the emergency department with a chief complaint of shortness of breath.  He states that he has had gradually worsening symptoms over the past couple of weeks.  He reports that his symptoms mainly started after being cardioverted by his cardiologist and put on metoprolol.  He reports having some mild lower extremity swelling.  He reports significant nocturnal orthopnea.  He is anticoagulated on Xarelto.  He denies any fever, chills, or productive cough.  He also reports that he has left great toe pain, which he thinks is turf toe versus gout.  He states that he did stub his toe remotely.  He has no history of gout.  The history is provided by the patient. No language interpreter was used.    Past Medical History:  Diagnosis Date  . Back pain, chronic   . CHF (congestive heart failure) (HCC)   . Diabetes mellitus   . Hypertension   . Obesity     Patient Active Problem List   Diagnosis Date Noted  . Diabetes (HCC) 07/22/2015  . Essential hypertension, benign 07/22/2015  . Chronic pain syndrome 07/22/2015    Past Surgical History:  Procedure Laterality Date  . CARDIOVERSION N/A 02/18/2018   Procedure: CARDIOVERSION;  Surgeon: Elder Negus, MD;  Location: [facility];  Service: Cardiovascular;  Laterality: N/A;        Home Medications    Prior to Admission medications   Medication Sig Start Date End Date Taking? Authorizing Provider  albuterol (PROVENTIL HFA;VENTOLIN HFA) 108 (90 Base) MCG/ACT inhaler Inhale 1 puff into the lungs every 6 (six) hours as needed for wheezing or shortness of breath.     [provider]  benazepril (LOTENSIN) 20 MG tablet Take 20 mg by mouth daily.    [provider]  diltiazem (DILACOR XR) 120 MG 24 hr capsule Take 120 mg by mouth daily.    [provider]  furosemide (LASIX) 40 MG tablet Take 40 mg by mouth daily.    [provider]  gabapentin (NEURONTIN) 300 MG capsule Take 300 mg by mouth 2 (two) times daily.    [provider]  HYDROcodone-acetaminophen (NORCO) 10-325 MG per tablet Take 1 tablet by mouth 3 (three) times daily as needed. For pain Patient not taking: Reported on 02/26/2017 07/22/15   Juan Dane, MD  metFORMIN (GLUCOPHAGE) 500 MG tablet Take 500 mg by mouth 2 (two) times daily with a meal.     [provider]  Multiple Vitamins-Minerals (MULTIVITAMIN PO) Take 1 tablet by mouth daily.    [provider]  omeprazole (PRILOSEC) 20 MG capsule Take 1 capsule (20 mg total) by mouth daily. 04/25/12 04/25/13  Azalia Bilis, MD  oxyCODONE-acetaminophen (PERCOCET) 10-325 MG tablet Take 1 tablet by mouth every 4 (four) hours as needed for pain.    [provider]  rivaroxaban (XARELTO) 20 MG TABS tablet Take 20 mg by mouth daily with supper.    [provider]  sildenafil (VIAGRA) 50 MG tablet Take 50 mg by mouth daily as needed for erectile dysfunction.    [provider]  terazosin (HYTRIN) 5 MG capsule Take 5 mg by mouth every evening.     [provider]  triamterene-hydrochlorothiazide (MAXZIDE-25) 37.5-25 MG per tablet Take 1 tablet by mouth daily.    [provider]    Family History Family History  Problem Relation Age of Onset  . Hypertension Mother     Social History Social History   Tobacco Use  . Smoking status: Never Smoker  Substance Use Topics  . Alcohol use: Yes  . Drug use: No     Allergies   Patient has no known allergies.   Review of Systems Review of Systems  All other systems reviewed and are  negative.    Physical Exam Updated Vital Signs BP (!) 165/110 (BP Location: Left Arm)   Pulse (!) 111   Temp 99.1 F (37.3 C) (Oral)   Resp (!) 22   SpO2 93%   Physical Exam  Constitutional: He is oriented to person, place, and time. He appears well-developed and well-nourished.  HENT:  Head: Normocephalic and atraumatic.  Eyes: Pupils are equal, round, and reactive to light. Conjunctivae and EOM are normal. Right eye exhibits no discharge. Left eye exhibits no discharge. No scleral icterus.  Neck: Normal range of motion. Neck supple. No JVD present.  Cardiovascular: Normal rate, regular rhythm and normal heart sounds. Exam reveals no gallop and no friction rub.  No murmur heard. Pulmonary/Chest: Effort normal. No respiratory distress. He has wheezes (Faint wheezes). He has no rales. He exhibits no tenderness.  Abdominal: Soft. He exhibits no distension and no mass. There is no tenderness. There is no rebound and no guarding.  Musculoskeletal: Normal range of motion. He exhibits no tenderness.       Left lower leg: He exhibits edema.  Neurological: He is alert and oriented to person, place, and time.  Skin: Skin is warm and dry.  Psychiatric: He has a normal mood and affect. His behavior is normal. Judgment and thought content normal.  Nursing note and vitals reviewed.    ED Treatments / Results  Labs (all labs ordered are listed, but only abnormal results are displayed) Labs Reviewed  CBC WITH DIFFERENTIAL/PLATELET - Abnormal; Notable for the following components:      Result Value   RBC 3.92 (*)    Hemoglobin 10.7 (*)    HCT 31.7 (*)    All other components within normal limits  BRAIN NATRIURETIC PEPTIDE - Abnormal; Notable for the following components:   B Natriuretic Peptide 864.7 (*)    All other components within normal limits  POCT I-STAT, CHEM 8 - Abnormal; Notable for the following components:   Glucose, Bld 156 (*)    Hemoglobin 10.5 (*)    HCT 31.0 (*)     All other components within normal limits  I-STAT CHEM 8, ED  I-STAT TROPONIN, ED  POCT I-STAT TROPONIN I    EKG EKG Interpretation  Date/Time:  Sunday August 10 2018 02:37:28 EDT Ventricular Rate:  113 PR Interval:    QRS Duration: 87 QT Interval:  335 QTC Calculation: 435 R Axis:   70 Text Interpretation:  Atrial fibrillation Borderline repolarization abnormality Confirmed by Palumbo, April ([id number]) on 08/10/2018 3:10:18 AM   Radiology Dg Chest 2 View  Result Date: 08/10/2018 CLINICAL DATA:  Worsening dyspnea. EXAM: CHEST - 2 VIEW COMPARISON:  04/25/2012 FINDINGS: Cardiomegaly with aortic atherosclerosis. Interval increase in interstitial edema and central vascular congestion since prior. Osteoarthritis of the AC joints bilaterally with undersurface spurring.  No acute nor suspicious osseous abnormalities. IMPRESSION: Cardiomegaly with interstitial pulmonary edema. Electronically Signed   By: Tollie Eth M.D.   On: 08/10/2018 03:17    Procedures Procedures (including critical care time) CRITICAL CARE Performed by: Roxy Horseman  AFib, rates in 110s, pulmonary edema on CXR, on BiPAP, Diltiazem drip  Total critical care time: 55 minutes  Critical care time was exclusive of separately billable procedures and treating other patients.  Critical care was necessary to treat or prevent imminent or life-threatening deterioration.  Critical care was time spent personally by me on the following activities: development of treatment plan with patient and/or surrogate as well as nursing, discussions with consultants, evaluation of patient's response to treatment, examination of patient, obtaining history from patient or surrogate, ordering and performing treatments and interventions, ordering and review of laboratory studies, ordering and review of radiographic studies, pulse oximetry and re-evaluation of patient's condition.  Medications Ordered in ED Medications  albuterol  (PROVENTIL) (2.5 MG/3ML) 0.083% nebulizer solution 5 mg (has no administration in time range)     Initial Impression / Assessment and Plan / ED Course  I have reviewed the triage vital signs and the nursing notes.  Pertinent labs & imaging results that were available during my care of the patient were reviewed by me and considered in my medical decision making (see chart for details).    Patient with SOB.  A. fib on EKG.  Pulmonary edema on chest x-ray.  Significant nocturnal orthopnea.  Does have mild lower extremity edema.  Will check labs, will reassess.  Patient will require admission.    MDM Reviewed: previous chart, nursing note and vitals Interpretation: labs, ECG and x-ray (pulmonary edema on CXR, BNP 864, EKG shows afib with rate of 113) Total time providing critical care: 30-74 minutes. This excludes time spent performing separately reportable procedures and services. Consults: admitting MD     Final Clinical Impressions(s) / ED Diagnoses   Final diagnoses:  Acute on chronic congestive heart failure, unspecified heart failure type (HCC)  Atrial fibrillation, unspecified type (HCC)  Acute gout involving toe of left foot, unspecified cause    ED Discharge Orders    None       Roxy Horseman, PA-C 08/10/18 0448    Palumbo, April, MD 08/10/18 Jeralyn Bennett

## 2018-08-11 DIAGNOSIS — Z7984 Long term (current) use of oral hypoglycemic drugs: Secondary | ICD-10-CM | POA: Diagnosis not present

## 2018-08-11 DIAGNOSIS — I481 Persistent atrial fibrillation: Secondary | ICD-10-CM | POA: Diagnosis present

## 2018-08-11 DIAGNOSIS — I272 Pulmonary hypertension, unspecified: Secondary | ICD-10-CM | POA: Diagnosis present

## 2018-08-11 DIAGNOSIS — Z79899 Other long term (current) drug therapy: Secondary | ICD-10-CM | POA: Diagnosis not present

## 2018-08-11 DIAGNOSIS — I480 Paroxysmal atrial fibrillation: Secondary | ICD-10-CM | POA: Diagnosis present

## 2018-08-11 DIAGNOSIS — I5033 Acute on chronic diastolic (congestive) heart failure: Secondary | ICD-10-CM

## 2018-08-11 DIAGNOSIS — G4733 Obstructive sleep apnea (adult) (pediatric): Secondary | ICD-10-CM | POA: Diagnosis present

## 2018-08-11 DIAGNOSIS — I4891 Unspecified atrial fibrillation: Secondary | ICD-10-CM | POA: Diagnosis not present

## 2018-08-11 DIAGNOSIS — N182 Chronic kidney disease, stage 2 (mild): Secondary | ICD-10-CM | POA: Diagnosis present

## 2018-08-11 DIAGNOSIS — K5900 Constipation, unspecified: Secondary | ICD-10-CM | POA: Diagnosis present

## 2018-08-11 DIAGNOSIS — Z9119 Patient's noncompliance with other medical treatment and regimen: Secondary | ICD-10-CM | POA: Diagnosis not present

## 2018-08-11 DIAGNOSIS — I429 Cardiomyopathy, unspecified: Secondary | ICD-10-CM | POA: Diagnosis present

## 2018-08-11 DIAGNOSIS — M109 Gout, unspecified: Secondary | ICD-10-CM | POA: Diagnosis present

## 2018-08-11 DIAGNOSIS — Z8249 Family history of ischemic heart disease and other diseases of the circulatory system: Secondary | ICD-10-CM | POA: Diagnosis not present

## 2018-08-11 DIAGNOSIS — R0602 Shortness of breath: Secondary | ICD-10-CM | POA: Diagnosis present

## 2018-08-11 DIAGNOSIS — I130 Hypertensive heart and chronic kidney disease with heart failure and stage 1 through stage 4 chronic kidney disease, or unspecified chronic kidney disease: Secondary | ICD-10-CM | POA: Diagnosis present

## 2018-08-11 DIAGNOSIS — Z7901 Long term (current) use of anticoagulants: Secondary | ICD-10-CM | POA: Diagnosis not present

## 2018-08-11 DIAGNOSIS — E1122 Type 2 diabetes mellitus with diabetic chronic kidney disease: Secondary | ICD-10-CM | POA: Diagnosis present

## 2018-08-11 DIAGNOSIS — Z6841 Body Mass Index (BMI) 40.0 and over, adult: Secondary | ICD-10-CM | POA: Diagnosis not present

## 2018-08-11 DIAGNOSIS — I4892 Unspecified atrial flutter: Secondary | ICD-10-CM | POA: Diagnosis present

## 2018-08-11 DIAGNOSIS — I10 Essential (primary) hypertension: Secondary | ICD-10-CM | POA: Diagnosis not present

## 2018-08-11 DIAGNOSIS — M79671 Pain in right foot: Secondary | ICD-10-CM | POA: Diagnosis present

## 2018-08-11 LAB — BASIC METABOLIC PANEL
Anion gap: 11 (ref 5–15)
BUN: 17 mg/dL (ref 6–20)
CO2: 27 mmol/L (ref 22–32)
Calcium: 9 mg/dL (ref 8.9–10.3)
Chloride: 101 mmol/L (ref 98–111)
Creatinine, Ser: 1.21 mg/dL (ref 0.61–1.24)
Glucose, Bld: 150 mg/dL — ABNORMAL HIGH (ref 70–99)
POTASSIUM: 3.9 mmol/L (ref 3.5–5.1)
SODIUM: 139 mmol/L (ref 135–145)

## 2018-08-11 LAB — HEMOGLOBIN A1C
HEMOGLOBIN A1C: 7.5 % — AB (ref 4.8–5.6)
MEAN PLASMA GLUCOSE: 168.55 mg/dL

## 2018-08-11 LAB — CREATININE, SERUM
CREATININE: 1.1 mg/dL (ref 0.61–1.24)
GFR calc Af Amer: >60 mL/min (ref >60)

## 2018-08-11 LAB — GLUCOSE, CAPILLARY
GLUCOSE-CAPILLARY: 162 mg/dL — AB (ref 70–99)
Glucose-Capillary: 208 mg/dL — ABNORMAL HIGH (ref 70–99)
Glucose-Capillary: 132 mg/dL — ABNORMAL HIGH (ref 70–99)

## 2018-08-11 LAB — CBC
HCT: 32.7 % — ABNORMAL LOW (ref 39.0–52.0)
Hemoglobin: 10.6 g/dL — ABNORMAL LOW (ref 13.0–17.0)
MCH: 26.8 pg (ref 26.0–34.0)
MCHC: 32.4 g/dL (ref 30.0–36.0)
MCV: 82.8 fL (ref 78.0–100.0)
PLATELETS: 234 10*3/uL (ref 150–400)
RBC: 3.95 MIL/uL — AB (ref 4.22–5.81)
RDW: 13.2 % (ref 11.5–15.5)
WBC: 8.5 10*3/uL (ref 4.0–10.5)

## 2018-08-11 LAB — HIV ANTIBODY (ROUTINE TESTING W REFLEX): HIV Screen 4th Generation wRfx: NONREACTIVE

## 2018-08-11 MED ORDER — SPIRONOLACTONE 25 MG PO TABS
25.0000 mg | ORAL_TABLET | Freq: Every day | ORAL | Status: DC
  Administered 2018-08-11 – 2018-08-14 (×4): 25 mg via ORAL
  Filled 2018-08-11 (×4): qty 1

## 2018-08-11 MED ORDER — SODIUM CHLORIDE 0.9 % IV SOLN: 250.0000 mL | INTRAVENOUS | Status: DC | PRN

## 2018-08-11 MED ORDER — ASPIRIN 81 MG PO CHEW
81.0000 mg | CHEWABLE_TABLET | ORAL | Status: AC
  Administered 2018-08-12: 81 mg via ORAL

## 2018-08-11 MED ORDER — POLYETHYLENE GLYCOL 3350 17 G PO PACK
17.0000 g | PACK | Freq: Every day | ORAL | Status: DC
  Administered 2018-08-11 – 2018-08-14 (×3): 17 g via ORAL
  Filled 2018-08-11 (×4): qty 1

## 2018-08-11 MED ORDER — TERAZOSIN HCL 5 MG PO CAPS
5.0000 mg | ORAL_CAPSULE | Freq: Every evening | ORAL | Status: DC
  Administered 2018-08-11 – 2018-08-13 (×3): 5 mg via ORAL
  Filled 2018-08-11 (×4): qty 1

## 2018-08-11 MED ORDER — SODIUM CHLORIDE 0.9% FLUSH: 3.0000 mL | Freq: Two times a day (BID) | INTRAVENOUS | Status: DC

## 2018-08-11 MED ORDER — FUROSEMIDE 10 MG/ML IJ SOLN
40.0000 mg | Freq: Two times a day (BID) | INTRAMUSCULAR | Status: DC
  Administered 2018-08-11 – 2018-08-12 (×2): 40 mg via INTRAVENOUS
  Filled 2018-08-11 (×2): qty 4

## 2018-08-11 MED ORDER — SODIUM CHLORIDE 0.9 % IV SOLN: 250.0000 mL | INTRAVENOUS | Status: DC

## 2018-08-11 MED ORDER — SODIUM CHLORIDE 0.9 % IV SOLN: INTRAVENOUS | Status: DC

## 2018-08-11 MED ORDER — INSULIN ASPART 100 UNIT/ML ~~LOC~~ SOLN
0.0000 [IU] | Freq: Every day | SUBCUTANEOUS | Status: DC
  Administered 2018-08-13: 4 [IU] via SUBCUTANEOUS

## 2018-08-11 MED ORDER — SODIUM CHLORIDE 0.9% FLUSH: 3.0000 mL | INTRAVENOUS | Status: DC | PRN

## 2018-08-11 MED ORDER — INSULIN ASPART 100 UNIT/ML ~~LOC~~ SOLN
0.0000 [IU] | Freq: Three times a day (TID) | SUBCUTANEOUS | Status: DC
  Administered 2018-08-11: 3 [IU] via SUBCUTANEOUS
  Administered 2018-08-11: 1 [IU] via SUBCUTANEOUS
  Administered 2018-08-12: 2 [IU] via SUBCUTANEOUS
  Administered 2018-08-12 – 2018-08-13 (×4): 1 [IU] via SUBCUTANEOUS
  Administered 2018-08-14: 2 [IU] via SUBCUTANEOUS

## 2018-08-11 MED ORDER — ACETAMINOPHEN 325 MG PO TABS
650.0000 mg | ORAL_TABLET | ORAL | Status: DC | PRN
  Administered 2018-08-11: 650 mg via ORAL
  Administered 2018-08-12: 325 mg via ORAL
  Filled 2018-08-11 (×2): qty 2

## 2018-08-11 MED ORDER — SENNOSIDES-DOCUSATE SODIUM 8.6-50 MG PO TABS
1.0000 | ORAL_TABLET | Freq: Two times a day (BID) | ORAL | Status: DC
  Administered 2018-08-11 – 2018-08-14 (×5): 1 via ORAL
  Filled 2018-08-11 (×7): qty 1

## 2018-08-11 NOTE — Plan of Care (Signed)

## 2018-08-11 NOTE — Consult Note (Addendum)
Cardiology Consultation:   Patient ID: LESS WOOLSEY MRN: [id number]; DOB: 02-16-1966  Admit date: 08/10/2018 Date of Consult: 08/11/2018  Primary Care Provider: Jackie Plum, MD Primary Cardiologist: Dr. Rosemary Holms Primary Electrophysiologist:  None    Patient Profile:   Juan Snyder is a 52 y.o. male with a hx of HTN, DM, morbid obesity, OSA w/CPAP, and persistent AFib who is being seen today for the evaluation of AFib at the request of Dr. Rosemary Holms.  History of Present Illness:   Mr. Porreca was seen by Dr. Rosemary Holms out patient 07/30/18, noted to be back in AF and planned for DCCV one he had uninterrupted a/c, noting noncompliance with this.  At baseline, his EF is 66% as seen on outside echocardiogram in 09/2017 per Dr. Damian Leavell note.  The patient saught attention at [facility] ER with progressive SOB/DOE, TTE noted EF was down to 20%, elevated BNP, in rapid AFib.  He was started on dilt gtt for rate control and IV diuretics.  EP is asked to visit for rhythm control strategies with AAD +/- ablation strategy  The patient is largely unaware of his AFib, no palpitations or SP.  He reports that when he saw Dr. Rosemary Holms on the 11th he was feeling quite well, took the stairs to his office.  He suspects the addition of the metoprolol is what started making him SOB, timing-wise, that is when he started noting it.  This was progressive minimally until a few days prir to his admission when he felt more sudenly much more SOB and came in.  No CP.    He is feeling less SOB, not completely resolved, and left great toe pain.  LABS K+ 3.9 BUN/Creat 17/1.21 (Calc crCl is 132) WBC 8.5 H/H 10/31 Plts 234  AFib Hx Uncertain when he was diagnosed, the patient rcalls early this year specifically being told of AFib, but for many years beingtold he has had an irregular heart beat DCCV 03/08/18 AAD hx: none  Past Medical History:  Diagnosis Date  . Back pain, chronic   . CHF  (congestive heart failure) (HCC)   . Diabetes mellitus   . Hypertension   . Obesity     Past Surgical History:  Procedure Laterality Date  . CARDIOVERSION N/A 02/18/2018   Procedure: CARDIOVERSION;  Surgeon: Elder Negus, MD;  Location: [facility] ENDOSCOPY;  Service: Cardiovascular;  Laterality: N/A;     Home Medications:  Prior to Admission medications   Medication Sig Start Date End Date Taking? Authorizing Provider  albuterol (PROVENTIL HFA;VENTOLIN HFA) 108 (90 Base) MCG/ACT inhaler Inhale 1 puff into the lungs every 6 (six) hours as needed for wheezing or shortness of breath.   Yes [provider]  benazepril (LOTENSIN) 20 MG tablet Take 20 mg by mouth daily.   Yes [provider]  diltiazem (DILACOR XR) 120 MG 24 hr capsule Take 120 mg by mouth daily.   Yes [provider]  furosemide (LASIX) 40 MG tablet Take 40 mg by mouth daily.   Yes [provider]  metFORMIN (GLUCOPHAGE) 500 MG tablet Take 500-1,000 mg by mouth See admin instructions. 1,000 mg every morning, 500 mg every night   Yes [provider]  metoprolol tartrate (LOPRESSOR) 25 MG tablet Take 25 mg by mouth 2 (two) times daily. 07/30/18  Yes [provider]  Multiple Vitamins-Minerals (MULTIVITAMIN PO) Take 1 tablet by mouth daily.   Yes [provider]  oxyCODONE-acetaminophen (PERCOCET) 10-325 MG tablet Take 1 tablet by mouth every  4 (four) hours as needed for pain.   Yes [provider]  rivaroxaban (XARELTO) 20 MG TABS tablet Take 20 mg by mouth daily.    Yes [provider]  sildenafil (VIAGRA) 50 MG tablet Take 50 mg by mouth daily as needed for erectile dysfunction.   Yes [provider]  terazosin (HYTRIN) 5 MG capsule Take 5 mg by mouth every evening.    Yes [provider]  tiZANidine (ZANAFLEX) 4 MG tablet Take 4 mg by mouth every 8 (eight) hours as needed for muscle spasms.  08/07/18  Yes [provider]     Inpatient Medications: Scheduled Meds: . benazepril  20 mg Oral Daily  . diltiazem  120 mg Oral Daily  . furosemide  40 mg Intravenous Q12H  . insulin aspart  0-5 Units Subcutaneous QHS  . insulin aspart  0-9 Units Subcutaneous TID WC  . metoprolol tartrate  50 mg Oral BID  . polyethylene glycol  17 g Oral Daily  . rivaroxaban  20 mg Oral Daily  . senna-docusate  1 tablet Oral BID  . sodium chloride flush  3 mL Intravenous Q12H  . spironolactone  25 mg Oral Daily  . terazosin  5 mg Oral QPM   Continuous Infusions: . sodium chloride    . diltiazem (CARDIZEM) infusion 5 mg/hr (08/11/18 1121)   PRN Meds: sodium chloride, acetaminophen, ondansetron (ZOFRAN) IV, oxyCODONE-acetaminophen **AND** oxyCODONE, sodium chloride flush, tiZANidine  Allergies:   No Known Allergies  Social History:   Social History   Socioeconomic History  . Marital status: Divorced    Spouse name: Not on file  . Number of children: Not on file  . Years of education: Not on file  . Highest education level: Not on file  Occupational History  . Not on file  Social Needs  . Financial resource strain: Not hard at all  . Food insecurity:    Worry: Patient refused    Inability: Patient refused  . Transportation needs:    Medical: No    Non-medical: No  Tobacco Use  . Smoking status: Never Smoker  Substance and Sexual Activity  . Alcohol use: Yes  . Drug use: No  . Sexual activity: Not on file  Lifestyle  . Physical activity:    Days per week: 0 days    Minutes per session: 0 min  . Stress: Only a little  Relationships  . Social connections:    Talks on phone: Twice a week    Gets together: Twice a week    Attends religious service: 1 to 4 times per year    Active member of club or organization: No    Attends meetings of clubs or organizations: Never    Relationship status: Divorced  . Intimate partner violence:    Fear of current or ex partner: No    Emotionally abused: No     Physically abused: No    Forced sexual activity: No  Other Topics Concern  . Not on file  Social History Narrative  . Not on file    Family History:   Family History  Problem Relation Age of Onset  . Hypertension Mother      ROS:  Please see the history of present illness.  All other ROS reviewed and negative.     Physical Exam/Data:   Vitals:   08/11/18 0745 08/11/18 0803 08/11/18 1006 08/11/18 1133  BP:   123/88 126/88  Pulse:   88 98  Resp: Marland Kitchen)  21   20  Temp:    98.2 F (36.8 C)  TempSrc:    Oral  SpO2:  98%  94%  Weight:      Height:        Intake/Output Summary (Last 24 hours) at 08/11/2018 1356 Last data filed at 08/11/2018 1121 Gross per 24 hour  Intake 804.54 ml  Output 2150 ml  Net -1345.46 ml   Filed Weights   08/10/18 0446 08/10/18 1733 08/11/18 0333  Weight: 134.2 kg 133.7 kg 132.5 kg   Body mass index is 44.42 kg/m.  General:  Well nourished, well developed, in no acute distress HEENT: normal Lymph: no adenopathy Neck: no JVD Endocrine:  No thryomegaly Vascular: No carotid bruits Cardiac:  irreg-irreg; no murmurs, gallops or rubs Lungs:  CTA b/l, no wheezing, rhonchi or rales  Abd: soft, non-tender, obese (central obesity) Ext: left foot is swollen, no erhythema Musculoskeletal:  No deformities Skin: warm and dry  Neuro:  No gross focal abnormalities noted Psych:  Normal affect   EKG:  The EKG was personally reviewed and demonstrates:    AFib 113pm, QRS 30ms 02/18/18; SR 87bpm, PR , QTc  Telemetry:  Telemetry was personally reviewed and demonstrates:   AFib rates 70's-90's currently  Relevant CV Studies:  08/10/18: TTE Study Conclusions - Left ventricle: The cavity size was normal. Wall thickness was   increased in a pattern of mild LVH. Systolic function was   severely reduced. The estimated ejection fraction was 20%.   Diffuse hypokinesis. - Mitral valve: Calcified annulus. There was moderate   regurgitation. - Left  atrium: The atrium was mildly dilated. (65mm) - Atrial septum: No defect or patent foramen ovale was identified. - Pulmonary arteries: PA peak pressure: 43 mm Hg (S). Impressions: - The right ventricular systolic pressure was increased consistent   with moderate pulmonary hypertension.  Echocardiogram 10/04/2017: Poor visualization. Mild RV dilatation. EF 66%. Normal RV size contractility.  Mild left atrial dilatation. No significant valvular abnormalities. RVSP 27 mmHg.  08/19/09; TTE LVEF 45-50%, + WMA  Laboratory Data:  Chemistry Recent Labs  Lab 08/10/18 0352 08/11/18 0307  NA 139 139  K 3.9 3.9  CL 105 101  CO2  --  27  GLUCOSE 156* 150*  BUN 18 17  CREATININE 1.00 1.21  CALCIUM  --  9.0  GFRNONAA  --  >60  GFRAA  --  >60  ANIONGAP  --  11    No results for input(s): PROT, ALBUMIN, AST, ALT, ALKPHOS, BILITOT in the last 168 hours. Hematology Recent Labs  Lab 08/10/18 0344 08/10/18 0352 08/11/18 0952  WBC 7.8  --  8.5  RBC 3.92*  --  3.95*  HGB 10.7* 10.5* 10.6*  HCT 31.7* 31.0* 32.7*  MCV 80.9  --  82.8  MCH 27.3  --  26.8  MCHC 33.8  --  32.4  RDW 13.4  --  13.2  PLT 262  --  234   Cardiac EnzymesNo results for input(s): TROPONINI in the last 168 hours.  Recent Labs  Lab 08/10/18 0349  TROPIPOC 0.02    BNP Recent Labs  Lab 08/10/18 0344  BNP 864.7*    DDimer No results for input(s): DDIMER in the last 168 hours.  Radiology/Studies:   Dg Chest 2 View Result Date: 08/10/2018 CLINICAL DATA:  Worsening dyspnea. EXAM: CHEST - 2 VIEW COMPARISON:  04/25/2012 FINDINGS: Cardiomegaly with aortic atherosclerosis. Interval increase in interstitial edema and central vascular congestion since prior. Osteoarthritis of  the [facility] joints bilaterally with undersurface spurring. No acute nor suspicious osseous abnormalities. IMPRESSION: Cardiomegaly with interstitial pulmonary edema. Electronically Signed   By: Tollie Eth M.D.   On: 08/10/2018 03:17     Assessment and Plan:   1. Persistent AFib     CHA2DS2Vasc is at least 2, on Xarelto  Rate is controlled with dilt gtt Non-compliance with his a/c is concerning for rhythm control strategies, he will need to be compliant with his meds  He is planned for cath tomorrow  1C agents not available, will need to consider Tikosyn perhaps, though last SR EKG QT may be a little long for that.  He is quite young for amiodarone.  Ablation may be a good option, though his LA measured 57mm (?) described as mildly enlarged  Discussed with patient importance of medicine compliance, weight loss, CPAP compliance.  He reports his machine is very old, does not seem to work as well as it had for him previously, has a new visit for CPAP titration in Nov.  He denies much ETOH, maybe 3-4 drinks every couple weekends, not regularly  Dr. Ladona Ridgel will see later today  2. Acute CHF     C/w primary cardiology team     Fluid Neg -  3. Left foot/toe pain     ? Gout     Deferred to primary team       For questions or updates, please contact [facility] Please consult [email or web address] for contact info under     Signed, Sheilah Pigeon, PA-C  08/11/2018 1:56 PM;  EP Attending  Patient seen and examined. Agree with the findings as noted above. The patient has both atrial fib with an RVR and newly diagnosed LV dysfunction of unclear etiology and is pending left heart cath. His QT interval is too long (probably ) for dofetilide and his marked LA enlargement makes catheter ablation relatively contra-indicated (lack of efficacy). Rhythm control is reasonable but only option is amiodarone. His obesity will make loading problematic in that it will take longer. I would recommend starting amiodarone 400 bid for a week, then 400 daily for 3-4 weeks then DCCV. If he maintains NSR his amio could be reduced therafter. CPAP compliance and weight loss will be very important. Finally, he will need his EF rechecked in  3 months. Hopefully his EF will be improved but if not then consideration for ICD insertion for primary prevention an option. I would not recommend a Life Vest in this patient as data not supportive.   Leonia Reeves.D.

## 2018-08-11 NOTE — Progress Notes (Signed)
RT NOTES: Assessed patient on morning rounds. Patient asked for extension tubing for the oxygen on his dream station bipap. Stated the tubing was too tight and kept pulling his mask off during the night. Added extension tubing to his bipap for use tonight. Patient sitting on side of bed of 3lpm nasal cannula, no distress noted. Sats 100%. Will continue to monitor.

## 2018-08-11 NOTE — Progress Notes (Signed)
[name] TEAM 1 - Stepdown/ICU TEAM  Juan Snyder  WJX:[id number] DOB: May 13, 1966 DOA: 08/10/2018 PCP: Jackie Plum, MD    Brief Narrative:  52 y.o. male with a hx of A.Fib, CHF, DM, and HTN who presented to the ED with SOB, orthopnea, DOE gradually worsening over a couple of weeks. In the ED he was found to be in Afib w/ RVR, and a CXR confirmed significant pulmonary edema.    Significant Events: 9/22 admit   Subjective: Feeling better overall.  Less sob, but not yet back to baseline.  Appetite is improving.  C/o constipation.  No n/v, chest pain, or ha.    Assessment & Plan:  Acute exacerbation of severe systolic CHF EF 20% via TTE this admit - cont w/ diuresis - follow Is/Os and weights  Filed Weights   08/10/18 0446 08/10/18 1733 08/11/18 0333  Weight: 134.2 kg 133.7 kg 132.5 kg    Parox A.Fib w/ acute RVR  Rate now controlled on iv diltiazem - attempt to wean off gtt and off CCB - for DCCV 9/25  DM2 CBG currently well controlled - follow   Morbid obesity - Body mass index is 44.42 kg/m.   OSA Reportedly on CPAP - cont while inpatient   HTN BP currently well controlled  DVT prophylaxis: Xarelto  Code Status: FULL CODE Family Communication: no family present at time of exam  Disposition Plan: SDU   Consultants:  Cardiology   Antimicrobials:  none   Objective: Blood pressure 123/88, pulse 88, temperature 98.4 F (36.9 C), temperature source Oral, resp. rate 19, height 5\' 8"  (1.727 m), weight 132.5 kg, SpO2 98 %.  Intake/Output Summary (Last 24 hours) at 08/11/2018 1100 Last data filed at 08/11/2018 0538 Gross per 24 hour  Intake 804.54 ml  Output 1550 ml  Net -745.46 ml   Filed Weights   08/10/18 0446 08/10/18 1733 08/11/18 0333  Weight: 134.2 kg 133.7 kg 132.5 kg    Examination: General: No acute respiratory distress Lungs: Clear to auscultation bilaterally without wheezes or crackles Cardiovascular: irreg irreg - rate controlled at 90  - no m or rub  Abdomen: Nontender, nondistended, soft, bowel sounds positive, no rebound, no ascites, no appreciable mass Extremities: 1+ B LE edema w/o cyanosis   CBC: Recent Labs  Lab 08/10/18 0344 08/10/18 0352 08/11/18 0952  WBC 7.8  --  PENDING  NEUTROABS 5.9  --   --   HGB 10.7* 10.5* 10.6*  HCT 31.7* 31.0* 32.7*  MCV 80.9  --  82.8  PLT 262  --  234   Basic Metabolic Panel: Recent Labs  Lab 08/10/18 0352 08/11/18 0307  NA 139 139  K 3.9 3.9  CL 105 101  CO2  --  27  GLUCOSE 156* 150*  BUN 18 17  CREATININE 1.00 1.21  CALCIUM  --  9.0   GFR: Estimated Creatinine Clearance: 93.9 mL/min (by C-G formula based on SCr of 1.21 mg/dL).  Liver Function Tests: No results for input(s): AST, ALT, ALKPHOS, BILITOT, PROT, ALBUMIN in the last 168 hours. No results for input(s): LIPASE, AMYLASE in the last 168 hours. No results for input(s): AMMONIA in the last 168 hours.  HbA1C: Hgb A1c MFr Bld  Date/Time Value Ref Range Status  08/11/2018 03:07 AM 7.5 (H) 4.8 - 5.6 % Final    Comment:    (NOTE) Pre diabetes:          5.7%-6.4% Diabetes:              >  6.4% Glycemic control for   <7.0% adults with diabetes   08/18/2009 10:14 PM (H) 4.6 - 6.1 % Final   6.7 (NOTE) The ADA recommends the following therapeutic goal for glycemic control related to Hgb A1c measurement: Goal of therapy: <6.5 Hgb A1c  Reference: American Diabetes Association: Clinical Practice Recommendations 2010, Diabetes Care, 2010, 33: (Suppl  1).    CBG: Recent Labs  Lab 08/10/18 1738  GLUCAP 146*    Recent Results (from the past 240 hour(s))  MRSA PCR Screening     Status: None   Collection Time: 08/10/18  6:23 AM  Result Value Ref Range Status   MRSA by PCR NEGATIVE NEGATIVE Final    Comment:        The GeneXpert MRSA Assay (FDA approved for NASAL specimens only), is one component of a comprehensive MRSA colonization surveillance program. It is not intended to diagnose MRSA infection  nor to guide or monitor treatment for MRSA infections. Performed at [facility], [address] [place], [place] [address]      Scheduled Meds: . benazepril  20 mg Oral Daily  . diltiazem  120 mg Oral Daily  . furosemide  40 mg Intravenous Q12H  . metoprolol tartrate  50 mg Oral BID  . polyethylene glycol  17 g Oral Daily  . rivaroxaban  20 mg Oral Daily  . senna-docusate  1 tablet Oral BID  . sodium chloride flush  3 mL Intravenous Q12H  . spironolactone  25 mg Oral Daily   Continuous Infusions: . sodium chloride    . diltiazem (CARDIZEM) infusion 10 mg/hr (08/11/18 1003)     LOS: 0 days   Lonia Blood, MD [facility] Office  [phone] Pager - Text Page per Amion  If 7PM-7AM, please contact night-coverage per Amion 08/11/2018, 11:00 AM

## 2018-08-11 NOTE — Progress Notes (Addendum)
Subjective:  Breathing slightly improved. Diuresing well.  Objective:  Vital Signs in the last 24 hours: Temp:  [98.1 F (36.7 C)-99.4 F (37.4 C)] 98.4 F (36.9 C) (09/23 0729) Pulse Rate:  [25-114] 42 (09/23 0729) Resp:  [0-33] 19 (09/23 0729) BP: (108-231)/(71-187) 108/84 (09/23 0729) SpO2:  [89 %-99 %] 98 % (09/23 0803) Weight:  [132.5 kg-133.7 kg] 132.5 kg (09/23 0333)  Intake/Output from previous day: 09/22 0701 - 09/23 0700 In: 804.5 [P.O.:480; I.V.:324.5] Out: 2050 [Urine:2050] Intake/Output from this shift: No intake/output data recorded.  Physical Exam: Nursing note and vitals reviewed. Constitutional: He is oriented to person, place, and time. He appears well-developed and well-nourished. No distress.  HENT:  Head: Normocephalic and atraumatic.  Eyes: Pupils are equal, round, and reactive to light. Conjunctivae are normal.  Neck: Normal range of motion. Neck supple. JVD present.  Cardiovascular: Intact distal pulses. An irregularly irregular rhythm present. Tachycardia present.  No murmur heard. Respiratory: No accessory muscle usage. Mild tachypnea noted. He has decreased breath sounds in the right lower field and the left lower field.  GI: Soft. Bowel sounds are normal. He exhibits distension. There is no tenderness. There is no rebound and no guarding.  Musculoskeletal: He exhibits edema (2+ b/l) and tenderness (Rt great toe tenderness. No erythema, warmth).  Lymphadenopathy:    He has no cervical adenopathy.  Neurological: He is alert and oriented to person, place, and time.  Skin: Skin is warm and dry.  Psychiatric: He has a normal mood and affect.   Lab Results: Recent Labs    08/10/18 0344 08/10/18 0352  WBC 7.8  --   HGB 10.7* 10.5*  PLT 262  --    Recent Labs    08/10/18 0352 08/11/18 0307  NA 139 139  K 3.9 3.9  CL 105 101  CO2  --  27  GLUCOSE 156* 150*  BUN 18 17  CREATININE 1.00 1.21   Cardiac studies: EKG 08/10/2018: Atrial  fibrilation with rapid ventricular response Normal axis. Incomplete right bundle branch block. Nonspecific ST-T changes.  Hospital echocardiogram 08/10/2018: Study Conclusions  - Left ventricle: The cavity size was normal. Wall thickness was increased in a pattern of mild LVH. Systolic function was severely reduced. The estimated ejection fraction was 20%. Diffuse hypokinesis. - Mitral valve: Calcified annulus. There was moderate regurgitation. - Left atrium: The atrium was mildly dilated. - Atrial septum: No defect or patent foramen ovale was identified. - Pulmonary arteries: PA peak pressure: 43 mm Hg (S).  Impressions:  - The right ventricular systolic pressure was increased consistent with moderate pulmonary hypertension.  Echocardiogram 10/04/2017: Poor visualization. Mild RV dilatation. EF 66%. Normal RV size contractility.  Mild left atrial dilatation. No significant valvular abnormalities. RVSP 27 mmHg.    Assessment:  52-year-old African-American male with hypertension, type II diabetes mellitus, morbid obesity, obstructive sleep apnea on CPAP, paroxysmal Afib/flutter, now admitted to [facility] with shortness of breath.  Acute systolic heart failure, NYHA class IV HFrEF:     EF 20%, EF 65% in 09/2017. 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 Not hypotensive Hypertension:       Suboptimal. On above meds, plus benazepril 20 mg daily Type 2 DM:        Metformin at home. On sliding scale here Morbid obesity OSA on CPAP   Rt foot pain Mildly increase Cr: Not AKI. Initial Cr was likely lower than his baseline. Around 1.2 is his baseline Cr. I expect some increase in Cr with aggressive diuresis, which is necessary at this time.   Recommendation:  Continue  diuresis, decrease lasix iv to 40 mg bid. Added spironolactone 25 mg PO daily. Continue benazepril 20 mg for now. Will change to Entresto outpatient. I am hopeful that this is arrhythmia induced cardiomyopathy and could completely recover after restoration and maintenance of sinus rhythm. Plan for left and right heart cath on 09/24 to evaluate for ischemic etiology and hypertension. Strict intake and output, daily weights, 2 g sodium diet.   Continue to attempt wean him off IV diltiazem drip. Okay to continue PO diltiazem 120 mg, along with metoprolol 50 mg PO bid for now. Ideally, would like to wean him off diltiazem completely. If necessary for rate control, will add amiodarone. Plan for cardioversion 09/25. Consulted EP for consideration for outpatient ablation. Continue xarelto for anticoagulation.  Recommend CPAP for OSA.  Rest of the management per the primary team.   LOS: 0 days    Kaveon Blatz J Nakiea Metzner 08/11/2018, 9:07 AM  Ireene Ballowe J Moniqua Engebretsen, MD [facility] Pager: [phone] Office: [phone] If no answer Cell [phone]   

## 2018-08-11 NOTE — Progress Notes (Signed)
Pt placed on BIPAP 14/5 with 3lpm oxygen bled into machine- pt tolerating well with sats of 99% and hr 86 with resp rate of 16.  Nasal cannula at bedside.

## 2018-08-11 NOTE — H&P (View-Only) (Signed)
Subjective:  Breathing slightly improved. Diuresing well.  Objective:  Vital Signs in the last 24 hours: Temp:  [98.1 F (36.7 C)-99.4 F (37.4 C)] 98.4 F (36.9 C) (09/23 0729) Pulse Rate:  [25-114] 42 (09/23 0729) Resp:  [0-33] 19 (09/23 0729) BP: (108-231)/(71-187) 108/84 (09/23 0729) SpO2:  [89 %-99 %] 98 % (09/23 0803) Weight:  [132.5 kg-133.7 kg] 132.5 kg (09/23 0333)  Intake/Output from previous day: 09/22 0701 - 09/23 0700 In: 804.5 [P.O.:480; I.V.:324.5] Out: 2050 [Urine:2050] Intake/Output from this shift: No intake/output data recorded.  Physical Exam: Nursing note and vitals reviewed. Constitutional: He is oriented to person, place, and time. He appears well-developed and well-nourished. No distress.  HENT:  Head: Normocephalic and atraumatic.  Eyes: Pupils are equal, round, and reactive to light. Conjunctivae are normal.  Neck: Normal range of motion. Neck supple. JVD present.  Cardiovascular: Intact distal pulses. An irregularly irregular rhythm present. Tachycardia present.  No murmur heard. Respiratory: No accessory muscle usage. Mild tachypnea noted. He has decreased breath sounds in the right lower field and the left lower field.  GI: Soft. Bowel sounds are normal. He exhibits distension. There is no tenderness. There is no rebound and no guarding.  Musculoskeletal: He exhibits edema (2+ b/l) and tenderness (Rt great toe tenderness. No erythema, warmth).  Lymphadenopathy:    He has no cervical adenopathy.  Neurological: He is alert and oriented to person, place, and time.  Skin: Skin is warm and dry.  Psychiatric: He has a normal mood and affect.   Lab Results: Recent Labs    08/10/18 0344 08/10/18 0352  WBC 7.8  --   HGB 10.7* 10.5*  PLT 262  --    Recent Labs    08/10/18 0352 08/11/18 0307  NA 139 139  K 3.9 3.9  CL 105 101  CO2  --  27  GLUCOSE 156* 150*  BUN 18 17  CREATININE 1.00 1.21   Cardiac studies: EKG 08/10/2018: Atrial  fibrilation with rapid ventricular response Normal axis. Incomplete right bundle branch block. Nonspecific ST-T changes.  Hospital echocardiogram 08/10/2018: Study Conclusions  - Left ventricle: The cavity size was normal. Wall thickness was increased in a pattern of mild LVH. Systolic function was severely reduced. The estimated ejection fraction was 20%. Diffuse hypokinesis. - Mitral valve: Calcified annulus. There was moderate regurgitation. - Left atrium: The atrium was mildly dilated. - Atrial septum: No defect or patent foramen ovale was identified. - Pulmonary arteries: PA peak pressure: 43 mm Hg (S).  Impressions:  - The right ventricular systolic pressure was increased consistent with moderate pulmonary hypertension.  Echocardiogram 10/04/2017: Poor visualization. Mild RV dilatation. EF 66%. Normal RV size contractility.  Mild left atrial dilatation. No significant valvular abnormalities. RVSP 27 mmHg.    Assessment:  52 year old African-American male with hypertension, type II diabetes mellitus, morbid obesity, obstructive sleep apnea on CPAP, paroxysmal Afib/flutter, now admitted to [facility] with shortness of breath.  Acute systolic heart failure, NYHA class IV HFrEF:     EF 20%, EF 65% in 09/2017. 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 Not hypotensive Hypertension:       Suboptimal. On above meds, plus benazepril 20 mg daily Type 2 DM:        Metformin at home. On sliding scale here Morbid obesity OSA on CPAP  Rt foot pain Mildly increase Cr: Not AKI. Initial Cr was likely lower than his baseline. Around 1.2 is his baseline Cr. I expect some increase in Cr with aggressive diuresis, which is necessary at this time.   Recommendation:  Continue  diuresis, decrease lasix iv to 40 mg bid. Added spironolactone 25 mg PO daily. Continue benazepril 20 mg for now. Will change to [facility] outpatient. I am hopeful that this is arrhythmia induced cardiomyopathy and could completely recover after restoration and maintenance of sinus rhythm. Plan for left and right heart cath on 09/24 to evaluate for ischemic etiology and hypertension. Strict intake and output, daily weights, 2 g sodium diet.   Continue to attempt wean him off IV diltiazem drip. Okay to continue PO diltiazem 120 mg, along with metoprolol 50 mg PO bid for now. Ideally, would like to wean him off diltiazem completely. If necessary for rate control, will add amiodarone. Plan for cardioversion 09/25. Consulted EP for consideration for outpatient ablation. Continue xarelto for anticoagulation.  Recommend CPAP for OSA.  Rest of the management per the primary team.   LOS: 0 days    Quinzell Malcomb J Vander Kueker 08/11/2018, 9:07 AM  Elder Negus, MD [facility] [place] Pager: [phone] Office: [phone] If no answer Cell [phone]

## 2018-08-12 ENCOUNTER — Encounter (HOSPITAL_COMMUNITY)
Admission: EM | Disposition: A | Discharge status: Home / Self Care | Payer: Self-pay | Source: Home / Self Care | Attending: Internal Medicine

## 2018-08-12 ENCOUNTER — Encounter (HOSPITAL_COMMUNITY): Payer: Self-pay

## 2018-08-12 HISTORY — PX: RIGHT/LEFT HEART CATH AND CORONARY ANGIOGRAPHY: CATH118266

## 2018-08-12 LAB — POCT I-STAT 3, VENOUS BLOOD GAS (G3P V)
ACID-BASE EXCESS: 2 mmol/L (ref 0.0–2.0)
ACID-BASE EXCESS: 3 mmol/L — AB (ref 0.0–2.0)
BICARBONATE: 27.3 mmol/L (ref 20.0–28.0)
BICARBONATE: 28.2 mmol/L — AB (ref 20.0–28.0)
O2 SAT: 59 %
O2 Saturation: 57 %
PCO2 VEN: 45.1 mmHg (ref 44.0–60.0)
PO2 VEN: 30 mmHg — AB (ref 32.0–45.0)
TCO2: 30 mmol/L (ref 22–32)
TCO2: 29 mmol/L (ref 22–32)
pCO2, Ven: 46.4 mmHg (ref 44.0–60.0)
pH, Ven: 7.39 (ref 7.250–7.430)
pH, Ven: 7.391 (ref 7.250–7.430)
pO2, Ven: 31 mmHg — CL (ref 32.0–45.0)

## 2018-08-12 LAB — GLUCOSE, CAPILLARY
GLUCOSE-CAPILLARY: 130 mg/dL — AB (ref 70–99)
Glucose-Capillary: 177 mg/dL — ABNORMAL HIGH (ref 70–99)
Glucose-Capillary: 136 mg/dL — ABNORMAL HIGH (ref 70–99)
Glucose-Capillary: 187 mg/dL — ABNORMAL HIGH (ref 70–99)

## 2018-08-12 LAB — HEPATIC FUNCTION PANEL
ALT: 21 U/L (ref 0–44)
AST: 16 U/L (ref 15–41)
Albumin: 3.1 g/dL — ABNORMAL LOW (ref 3.5–5.0)
Alkaline Phosphatase: 42 U/L (ref 38–126)
BILIRUBIN DIRECT: 0.5 mg/dL — AB (ref 0.0–0.2)
BILIRUBIN TOTAL: 1.7 mg/dL — AB (ref 0.3–1.2)
Indirect Bilirubin: 1.2 mg/dL — ABNORMAL HIGH (ref 0.3–0.9)
Total Protein: 6.2 g/dL — ABNORMAL LOW (ref 6.5–8.1)

## 2018-08-12 LAB — BASIC METABOLIC PANEL
Anion gap: 9 (ref 5–15)
BUN: 19 mg/dL (ref 6–20)
CALCIUM: 9 mg/dL (ref 8.9–10.3)
CO2: 28 mmol/L (ref 22–32)
CREATININE: 1.21 mg/dL (ref 0.61–1.24)
Chloride: 101 mmol/L (ref 98–111)
GFR calc non Af Amer: >60 mL/min (ref >60)
GLUCOSE: 138 mg/dL — AB (ref 70–99)
Potassium: 3.8 mmol/L (ref 3.5–5.1)
Sodium: 138 mmol/L (ref 135–145)

## 2018-08-12 LAB — POCT I-STAT 3, ART BLOOD GAS (G3+)
Acid-Base Excess: 1 mmol/L (ref 0.0–2.0)
BICARBONATE: 25.1 mmol/L (ref 20.0–28.0)
O2 SAT: 96 %
PH ART: 7.458 — AB (ref 7.350–7.450)
PO2 ART: 75 mmHg — AB (ref 83.0–108.0)
TCO2: 26 mmol/L (ref 22–32)
pCO2 arterial: 35.5 mmHg (ref 32.0–48.0)

## 2018-08-12 LAB — TSH: TSH: 1.213 u[IU]/mL (ref 0.350–4.500)

## 2018-08-12 SURGERY — RIGHT/LEFT HEART CATH AND CORONARY ANGIOGRAPHY
Anesthesia: LOCAL

## 2018-08-12 MED ORDER — IOHEXOL 350 MG/ML SOLN
INTRAVENOUS | Status: DC | PRN
  Administered 2018-08-12: 90 mL via INTRA_ARTERIAL

## 2018-08-12 MED ORDER — SODIUM CHLORIDE 0.9 % IV SOLN: 250.0000 mL | INTRAVENOUS | Status: DC | PRN

## 2018-08-12 MED ORDER — HEPARIN (PORCINE) IN NACL 1000-0.9 UT/500ML-% IV SOLN
INTRAVENOUS | Status: DC | PRN
  Administered 2018-08-12 (×2): 500 mL

## 2018-08-12 MED ORDER — VERAPAMIL HCL 2.5 MG/ML IV SOLN
INTRAVENOUS | Status: AC
  Filled 2018-08-12: qty 2

## 2018-08-12 MED ORDER — LIDOCAINE HCL (PF) 1 % IJ SOLN
INTRAMUSCULAR | Status: AC
  Filled 2018-08-12: qty 30

## 2018-08-12 MED ORDER — AMIODARONE HCL 200 MG PO TABS
400.0000 mg | ORAL_TABLET | Freq: Two times a day (BID) | ORAL | Status: DC
  Administered 2018-08-12 – 2018-08-14 (×5): 400 mg via ORAL
  Filled 2018-08-12 (×5): qty 2

## 2018-08-12 MED ORDER — SODIUM CHLORIDE 0.9% FLUSH: 3.0000 mL | INTRAVENOUS | Status: DC | PRN

## 2018-08-12 MED ORDER — ASPIRIN 81 MG PO CHEW
CHEWABLE_TABLET | ORAL | Status: AC
  Administered 2018-08-12: 81 mg via ORAL
  Filled 2018-08-12: qty 1

## 2018-08-12 MED ORDER — HEPARIN (PORCINE) IN NACL 1000-0.9 UT/500ML-% IV SOLN
INTRAVENOUS | Status: AC
  Filled 2018-08-12: qty 1000

## 2018-08-12 MED ORDER — HEPARIN SODIUM (PORCINE) 1000 UNIT/ML IJ SOLN
INTRAMUSCULAR | Status: DC | PRN
  Administered 2018-08-12: 5000 [IU] via INTRAVENOUS

## 2018-08-12 MED ORDER — SODIUM CHLORIDE 0.9 % IV SOLN: INTRAVENOUS | Status: DC

## 2018-08-12 MED ORDER — ACETAMINOPHEN 325 MG PO TABS: 650.0000 mg | ORAL_TABLET | ORAL | Status: DC | PRN

## 2018-08-12 MED ORDER — ONDANSETRON HCL 4 MG/2ML IJ SOLN: 4.0000 mg | Freq: Four times a day (QID) | INTRAMUSCULAR | Status: DC | PRN

## 2018-08-12 MED ORDER — FENTANYL CITRATE (PF) 100 MCG/2ML IJ SOLN
INTRAMUSCULAR | Status: AC
  Filled 2018-08-12: qty 2

## 2018-08-12 MED ORDER — MIDAZOLAM HCL 2 MG/2ML IJ SOLN
INTRAMUSCULAR | Status: AC
  Filled 2018-08-12: qty 2

## 2018-08-12 MED ORDER — LIDOCAINE HCL (PF) 1 % IJ SOLN
INTRAMUSCULAR | Status: DC | PRN
  Administered 2018-08-12: 2 mL
  Administered 2018-08-12: 4 mL

## 2018-08-12 MED ORDER — VERAPAMIL HCL 2.5 MG/ML IV SOLN
INTRAVENOUS | Status: DC | PRN
  Administered 2018-08-12: 5 mL via INTRA_ARTERIAL

## 2018-08-12 MED ORDER — MIDAZOLAM HCL 2 MG/2ML IJ SOLN
INTRAMUSCULAR | Status: DC | PRN
  Administered 2018-08-12 (×2): 1 mg via INTRAVENOUS

## 2018-08-12 MED ORDER — FUROSEMIDE 40 MG PO TABS
40.0000 mg | ORAL_TABLET | Freq: Two times a day (BID) | ORAL | Status: DC
  Administered 2018-08-12 – 2018-08-14 (×5): 40 mg via ORAL
  Filled 2018-08-12 (×5): qty 1

## 2018-08-12 MED ORDER — SODIUM CHLORIDE 0.9% FLUSH
3.0000 mL | Freq: Two times a day (BID) | INTRAVENOUS | Status: DC
  Administered 2018-08-12: 3 mL via INTRAVENOUS

## 2018-08-12 MED ORDER — FENTANYL CITRATE (PF) 100 MCG/2ML IJ SOLN
INTRAMUSCULAR | Status: DC | PRN
  Administered 2018-08-12 (×2): 50 ug via INTRAVENOUS

## 2018-08-12 MED ORDER — METOPROLOL SUCCINATE ER 100 MG PO TB24
100.0000 mg | ORAL_TABLET | Freq: Every day | ORAL | Status: DC
  Administered 2018-08-12 – 2018-08-14 (×3): 100 mg via ORAL
  Filled 2018-08-12 (×3): qty 1

## 2018-08-12 SURGICAL SUPPLY — 14 items
CATH 5FR JL3.5 JR4 ANG PIG MP (CATHETERS) ×1 IMPLANT
CATH BALLN WEDGE 5F 110CM (CATHETERS) ×1 IMPLANT
CATH INFINITI 5 FR 3DRC (CATHETERS) ×1 IMPLANT
DEVICE RAD COMP TR BAND LRG (VASCULAR PRODUCTS) ×1 IMPLANT
GLIDESHEATH SLEND SS 6F .021 (SHEATH) ×1 IMPLANT
GUIDEWIRE .025 260CM (WIRE) ×1 IMPLANT
GUIDEWIRE INQWIRE 1.5J.035X260 (WIRE) IMPLANT
INQWIRE 1.5J .035X260CM (WIRE) ×2
KIT HEART LEFT (KITS) ×2 IMPLANT
PACK CARDIAC CATHETERIZATION (CUSTOM PROCEDURE TRAY) ×2 IMPLANT
SHEATH GLIDE SLENDER 4/5FR (SHEATH) ×1 IMPLANT
SYR MEDRAD MARK V 150ML (SYRINGE) ×2 IMPLANT
TRANSDUCER W/STOPCOCK (MISCELLANEOUS) ×2 IMPLANT
TUBING CIL FLEX 10 FLL-RA (TUBING) ×2 IMPLANT

## 2018-08-12 NOTE — Interval H&P Note (Signed)
History and Physical Interval Note:  08/12/2018 7:40 AM  Juan Snyder  has presented today for surgery, with the diagnosis of cp  The various methods of treatment have been discussed with the patient and family. After consideration of risks, benefits and other options for treatment, the patient has consented to  Procedure(s): RIGHT/LEFT HEART CATH AND CORONARY ANGIOGRAPHY (N/A) as a surgical intervention .  The patient's history has been reviewed, patient examined, no change in status, stable for surgery.  I have reviewed the patient's chart and labs.  Questions were answered to the patient's satisfaction.    2012 Appropriate Use Criteria for Diagnostic Catheterization Home / Select Test of Interest Indication for RHC Cardiomyopathies Cardiomyopathies (Right and Left Heart Catheterization OR Right Heart Catheterization Alone With/Wit Cardiomyopathies  (Right and Left Heart Catheterization OR  Right Heart Catheterization Alone With/Without Left Ventriculography and Coronary Angiography)  Link Here: PlayerPo[employer][email or web address] Indication:  1. Known or suspected cardiomyopathy with or without heart failure A (7) Indication: 93; Score 7     Manish J Patwardhan

## 2018-08-12 NOTE — Plan of Care (Signed)
  Problem: Education: Goal: Knowledge of General Education information will improve Description Including pain rating scale, medication(s)/side effects and non-pharmacologic comfort measures Outcome: Progressing   Problem: Clinical Measurements: Goal: Ability to maintain clinical measurements within normal limits will improve Outcome: Progressing Goal: Will remain free from infection Outcome: Progressing Goal: Diagnostic test results will improve Outcome: Progressing Goal: Respiratory complications will improve Outcome: Progressing Goal: Cardiovascular complication will be avoided Outcome: Progressing   Problem: Activity: Goal: Risk for activity intolerance will decrease Outcome: Progressing   Problem: Nutrition: Goal: Adequate nutrition will be maintained Outcome: Progressing   Problem: Coping: Goal: Level of anxiety will decrease Outcome: Progressing   Problem: Elimination: Goal: Will not experience complications related to urinary retention Outcome: Progressing   Problem: Safety: Goal: Ability to remain free from injury will improve Outcome: Progressing   Problem: Skin Integrity: Goal: Risk for impaired skin integrity will decrease Outcome: Progressing   Problem: Elimination: Goal: Will not experience complications related to bowel motility Outcome: Not Progressing   Problem: Pain Managment: Goal: General experience of comfort will improve Outcome: Not Progressing

## 2018-08-12 NOTE — Progress Notes (Signed)
Patient returned via bed to 2C17 by cardiac cath lab staff.

## 2018-08-12 NOTE — Progress Notes (Signed)
Patient left unit for cath . Lab.in bed accompanied by RN.

## 2018-08-12 NOTE — Progress Notes (Signed)
[place] TEAM 1 - Stepdown/ICU TEAM  Juan Snyder  ZWC:[id number] DOB: [birth year] DOA: 08/10/2018 PCP: Jackie Plum, MD    Brief Narrative:  52 y.o. male with a hx of A.Fib, CHF, DM, and HTN who presented to the ED with SOB, orthopnea, DOE gradually worsening over a couple of weeks. In the ED he was found to be in Afib w/ RVR, and a CXR confirmed significant pulmonary edema.    Significant Events: 9/22 admit   Subjective: Still somewhat SOB.  Denies cp, n/v, or abdom pain.     Assessment & Plan:  Acute exacerbation of severe systolic CHF EF 20% via TTE this admit - cont w/ diuresis as renal fxn allows - aldactone added by Cards - follow Is/Os and weights  Filed Weights   08/10/18 0446 08/10/18 1733 08/11/18 0333  Weight: 134.2 kg 133.7 kg 132.5 kg    Parox A.Fib w/ acute RVR  EP now directing care - amio initiated   DM2 CBG currently controlled   Morbid obesity - Body mass index is 44.42 kg/m.   OSA Reportedly on CPAP - cont while inpatient   HTN BP currently well controlled  DVT prophylaxis: Xarelto  Code Status: FULL CODE Family Communication: no family present at time of exam  Disposition Plan: SDU   Consultants:  Cardiology   Antimicrobials:  none   Objective: Blood pressure 125/85, pulse (!) 105, temperature 98.3 F (36.8 C), temperature source Oral, resp. rate 18, height 5\' 8"  (1.727 m), weight 132.5 kg, SpO2 99 %.  Intake/Output Summary (Last 24 hours) at 08/12/2018 1042 Last data filed at 08/12/2018 0939 Gross per 24 hour  Intake -  Output 3235 ml  Net -3235 ml   Filed Weights   08/10/18 0446 08/10/18 1733 08/11/18 0333  Weight: 134.2 kg 133.7 kg 132.5 kg    Examination: General: No acute respiratory distress at rest in bed  Lungs: mild bibasilar crackles - no wheeinz g Cardiovascular: irreg irreg - rate controlled  Abdomen: Nontender, nondistended, soft, bowel sounds positive, no rebound Extremities: 1+ B LE edema w/o signif  change   CBC: Recent Labs  Lab 08/10/18 0344 08/10/18 0352 08/11/18 0952  WBC 7.8  --  8.5  NEUTROABS 5.9  --   --   HGB 10.7* 10.5* 10.6*  HCT 31.7* 31.0* 32.7*  MCV 80.9  --  82.8  PLT 262  --  234   Basic Metabolic Panel: Recent Labs  Lab 08/10/18 0352 08/11/18 0307 08/11/18 1441 08/12/18 0348  NA 139 139  --  138  K 3.9 3.9  --  3.8  CL 105 101  --  101  CO2  --  27  --  28  GLUCOSE 156* 150*  --  138*  BUN 18 17  --  19  CREATININE 1.00 1.21 1.10 1.21  CALCIUM  --  9.0  --  9.0   GFR: Estimated Creatinine Clearance: 93.9 mL/min (by C-G formula based on SCr of 1.21 mg/dL).  Liver Function Tests: Recent Labs  Lab 08/12/18 0348  AST 16  ALT 21  ALKPHOS 42  BILITOT 1.7*  PROT 6.2*  ALBUMIN 3.1*    HbA1C: Hgb A1c MFr Bld  Date/Time Value Ref Range Status  08/11/2018 03:07 AM 7.5 (H) 4.8 - 5.6 % Final    Comment:    (NOTE) Pre diabetes:          5.7%-6.4% Diabetes:              >  6.4% Glycemic control for   <7.0% adults with diabetes   08/18/2009 10:14 PM (H) 4.6 - 6.1 % Final   6.7 (NOTE) The ADA recommends the following therapeutic goal for glycemic control related to Hgb A1c measurement: Goal of therapy: <6.5 Hgb A1c  Reference: American Diabetes Association: Clinical Practice Recommendations 2010, Diabetes Care, 2010, 33: (Suppl  1).    CBG: Recent Labs  Lab 08/10/18 1738 08/11/18 1200 08/11/18 1701 08/11/18 2223 08/12/18 0756  GLUCAP 146* 208* 132* 162* 130*    Recent Results (from the past 240 hour(s))  MRSA PCR Screening     Status: None   Collection Time: 08/10/18  6:23 AM  Result Value Ref Range Status   MRSA by PCR NEGATIVE NEGATIVE Final    Comment:        The GeneXpert MRSA Assay (FDA approved for NASAL specimens only), is one component of a comprehensive MRSA colonization surveillance program. It is not intended to diagnose MRSA infection nor to guide or monitor treatment for MRSA infections. Performed at [facility], [address], [place], [place] [address]      Scheduled Meds: . amiodarone  400 mg Oral BID  . benazepril  20 mg Oral Daily  . furosemide  40 mg Oral BID  . insulin aspart  0-5 Units Subcutaneous QHS  . insulin aspart  0-9 Units Subcutaneous TID WC  . metoprolol succinate  100 mg Oral Daily  . polyethylene glycol  17 g Oral Daily  . rivaroxaban  20 mg Oral Daily  . senna-docusate  1 tablet Oral BID  . sodium chloride flush  3 mL Intravenous Q12H  . sodium chloride flush  3 mL Intravenous Q12H  . spironolactone  25 mg Oral Daily  . terazosin  5 mg Oral QPM     LOS: 1 day   Lonia Blood, MD [facility] Office  [phone] Pager - Text Page per Amion  If 7PM-7AM, please contact night-coverage per Amion 08/12/2018, 10:42 AM

## 2018-08-12 NOTE — Progress Notes (Signed)
Subjective:  Breathing and edema improved. Underwent cath today.  Objective:  Vital Signs in the last 24 hours: Temp:  [98 F (36.7 C)-99.7 F (37.6 C)] 98.2 F (36.8 C) (09/24 1148) Pulse Rate:  [76-136] 76 (09/24 1148) Resp:  [11-32] 26 (09/24 1148) BP: (94-151)/(69-98) 109/86 (09/24 1148) SpO2:  [82 %-99 %] 98 % (09/24 1148)  Intake/Output from previous day: 09/23 0701 - 09/24 0700 In: -  Out: 2710 [Urine:2710] Intake/Output from this shift: Total I/O In: -  Out: 525 [Urine:525]  Physical Exam: Nursing note and vitals reviewed. Constitutional: He is oriented to person, place, and time. He appears well-developed and well-nourished. No distress.  HENT:  Head: Normocephalic and atraumatic.  Eyes: Pupils are equal, round, and reactive to light. Conjunctivae are normal.  Neck: Normal range of motion. Neck supple. No JVD present.  Cardiovascular: Intact distal pulses. An irregularly irregular rhythm present. Tachycardia present.  No murmur heard. Respiratory: No accessory muscle usage. Mild tachypnea noted. He has decreased breath sounds in the right lower field and the left lower field.  GI: Soft. Bowel sounds are normal. He exhibits distension. There is no tenderness. There is no rebound and no guarding.  Musculoskeletal: He exhibits no edema. Lt toe tenderness (Correction to H&P and previous progress note) Lymphadenopathy:    He has no cervical adenopathy.  Neurological: He is alert and oriented to person, place, and time.  Skin: Skin is warm and dry.  Psychiatric: He has a normal mood and affect.   Lab Results: Recent Labs    08/10/18 0344 08/10/18 0352 08/11/18 0952  WBC 7.8  --  8.5  HGB 10.7* 10.5* 10.6*  PLT 262  --  234   Recent Labs    08/11/18 0307 08/11/18 1441 08/12/18 0348  NA 139  --  138  K 3.9  --  3.8  CL 101  --  101  CO2 27  --  28  GLUCOSE 150*  --  138*  BUN 17  --  19  CREATININE 1.21 1.10 1.21   Cardiac studies: EKG  08/10/2018: Atrial fibrilation with rapid ventricular response Normal axis. Incomplete right bundle branch block. Nonspecific ST-T changes.  Hospital echocardiogram 08/10/2018: Study Conclusions  - Left ventricle: The cavity size was normal. Wall thickness was increased in a pattern of mild LVH. Systolic function was severely reduced. The estimated ejection fraction was 20%. Diffuse hypokinesis. - Mitral valve: Calcified annulus. There was moderate regurgitation. - Left atrium: The atrium was mildly dilated. - Atrial septum: No defect or patent foramen ovale was identified. - Pulmonary arteries: PA peak pressure: 43 mm Hg (S).  Impressions:  - The right ventricular systolic pressure was increased consistent with moderate pulmonary hypertension.  Echocardiogram 10/04/2017: Poor visualization. Mild RV dilatation. EF 66%. Normal RV size contractility.  Mild left atrial dilatation. No significant valvular abnormalities. RVSP 27 mmHg.  Cath 08/12/2018: Left dominant circulation with no significant CAD Mildly elevated LVEDP (17 mmHg) Moderate WHO Grp II PH (Mean PA 33 mmHg) Mildly decompensated arrhythmia induced nonischemic cardiomyopathy. Continue guideline directed heart failure therapy, and management of Afib.   Assessment/Recommendations:  51-year-old African-American male with hypertension, type II diabetes mellitus, morbid obesity, obstructive sleep apnea on CPAP, paroxysmal Afib/flutter, now admitted to [place] [facility] with shortness of breath.  Acute systolic heart failure, NYHA class IV HFrEF:     EF 20%, EF 65% in 09/2017. Nonischemic cardiomyopathy, likely arrhythmia induced. Decreased lasix to 40 mg PO daily. Continue spironolactone 25 mg po   daily. Strict intake and output, daily weights, 2 g sodium diet.  Afib with RVR:     Switch to metoprolol succinate 100 mg daily. Diltiazem stopped. Start amiodarone 400 mg bid for 1 week. Will  decrease dose outpatient. Appreciate EP consult. Not a candidate for ablation. Management will revolve around wt loss and OSA treatment. He already had outpatient cardioversion scheduled for 09/25. Will proceed with the same. Anticipate discharge 09/26 morning of 09/25 afternoon.  Hypertension:       Suboptimal. On above meds, plus benazepril 20 mg daily. Type 2 DM:        Metformin at home. On sliding scale here Morbid obesity OSA on CPAP Lt foot pain Mildly increased Cr: Not AKI. Initial Cr was likely lower than his baseline. Around 1.2 is his baseline Cr. I expect some increase in Cr with aggressive diuresis, which is necessary at this time.    Rest of the management per the primary team.   LOS: 1 day    Nicholi Ghuman J Jakobee Brackins 08/12/2018, 12:50 PM  Idrees Quam J Annaliyah Willig, MD [facility] Pager: [phone] Office: [phone] If no answer Cell [phone]   

## 2018-08-12 NOTE — Plan of Care (Signed)
  Problem: Clinical Measurements: Goal: Ability to maintain clinical measurements within normal limits will improve Outcome: Progressing   Problem: Clinical Measurements: Goal: Cardiovascular complication will be avoided Outcome: Progressing   

## 2018-08-12 NOTE — Progress Notes (Signed)
CPAP set up for patient for the night.  Humidifier water level checked.  No issues noted at time of check.

## 2018-08-12 NOTE — H&P (View-Only) (Signed)
Subjective:  Breathing and edema improved. Underwent cath today.  Objective:  Vital Signs in the last 24 hours: Temp:  [98 F (36.7 C)-99.7 F (37.6 C)] 98.2 F (36.8 C) (09/24 1148) Pulse Rate:  [76-136] 76 (09/24 1148) Resp:  [11-32] 26 (09/24 1148) BP: (94-151)/(69-98) 109/86 (09/24 1148) SpO2:  [82 %-99 %] 98 % (09/24 1148)  Intake/Output from previous day: 09/23 0701 - 09/24 0700 In: -  Out: 2710 [Urine:2710] Intake/Output from this shift: Total I/O In: -  Out: 525 [Urine:525]  Physical Exam: Nursing note and vitals reviewed. Constitutional: He is oriented to person, place, and time. He appears well-developed and well-nourished. No distress.  HENT:  Head: Normocephalic and atraumatic.  Eyes: Pupils are equal, round, and reactive to light. Conjunctivae are normal.  Neck: Normal range of motion. Neck supple. No JVD present.  Cardiovascular: Intact distal pulses. An irregularly irregular rhythm present. Tachycardia present.  No murmur heard. Respiratory: No accessory muscle usage. Mild tachypnea noted. He has decreased breath sounds in the right lower field and the left lower field.  GI: Soft. Bowel sounds are normal. He exhibits distension. There is no tenderness. There is no rebound and no guarding.  Musculoskeletal: He exhibits no edema. Lt toe tenderness (Correction to H&P and previous progress note) Lymphadenopathy:    He has no cervical adenopathy.  Neurological: He is alert and oriented to person, place, and time.  Skin: Skin is warm and dry.  Psychiatric: He has a normal mood and affect.   Lab Results: Recent Labs    08/10/18 0344 08/10/18 0352 08/11/18 0952  WBC 7.8  --  8.5  HGB 10.7* 10.5* 10.6*  PLT 262  --  234   Recent Labs    08/11/18 0307 08/11/18 1441 08/12/18 0348  NA 139  --  138  K 3.9  --  3.8  CL 101  --  101  CO2 27  --  28  GLUCOSE 150*  --  138*  BUN 17  --  19  CREATININE 1.21 1.10 1.21   Cardiac studies: EKG  08/10/2018: Atrial fibrilation with rapid ventricular response Normal axis. Incomplete right bundle branch block. Nonspecific ST-T changes.  Hospital echocardiogram 08/10/2018: Study Conclusions  - Left ventricle: The cavity size was normal. Wall thickness was increased in a pattern of mild LVH. Systolic function was severely reduced. The estimated ejection fraction was 20%. Diffuse hypokinesis. - Mitral valve: Calcified annulus. There was moderate regurgitation. - Left atrium: The atrium was mildly dilated. - Atrial septum: No defect or patent foramen ovale was identified. - Pulmonary arteries: PA peak pressure: 43 mm Hg (S).  Impressions:  - The right ventricular systolic pressure was increased consistent with moderate pulmonary hypertension.  Echocardiogram 10/04/2017: Poor visualization. Mild RV dilatation. EF 66%. Normal RV size contractility.  Mild left atrial dilatation. No significant valvular abnormalities. RVSP 27 mmHg.  Cath 08/12/2018: Left dominant circulation with no significant CAD Mildly elevated LVEDP (17 mmHg) Moderate WHO Grp II PH (Mean PA 33 mmHg) Mildly decompensated arrhythmia induced nonischemic cardiomyopathy. Continue guideline directed heart failure therapy, and management of Afib.   Assessment/Recommendations:  52 year old African-American male with hypertension, type II diabetes mellitus, morbid obesity, obstructive sleep apnea on CPAP, paroxysmal Afib/flutter, now admitted to [facility] with shortness of breath.  Acute systolic heart failure, NYHA class IV HFrEF:     EF 20%, EF 65% in 09/2017. Nonischemic cardiomyopathy, likely arrhythmia induced. Decreased lasix to 40 mg PO daily. Continue spironolactone 25 mg po  daily. Strict intake and output, daily weights, 2 g sodium diet.  Afib with RVR:     Switch to metoprolol succinate 100 mg daily. Diltiazem stopped. Start amiodarone 400 mg bid for 1 week. Will  decrease dose outpatient. Appreciate EP consult. Not a candidate for ablation. Management will revolve around wt loss and OSA treatment. He already had outpatient cardioversion scheduled for 09/25. Will proceed with the same. Anticipate discharge 09/26 morning of 09/25 afternoon.  Hypertension:       Suboptimal. On above meds, plus benazepril 20 mg daily. Type 2 DM:        Metformin at home. On sliding scale here Morbid obesity OSA on CPAP Lt foot pain Mildly increased Cr: Not AKI. Initial Cr was likely lower than his baseline. Around 1.2 is his baseline Cr. I expect some increase in Cr with aggressive diuresis, which is necessary at this time.    Rest of the management per the primary team.   LOS: 1 day    Elsey Holts J Dmonte Maher 08/12/2018, 12:50 PM  Olanna Percifield Emiliano Dyer, MD [facility] [place] Pager: [phone] Office: [phone] If no answer Cell [phone]

## 2018-08-13 ENCOUNTER — Encounter (HOSPITAL_COMMUNITY)
Admission: EM | Disposition: A | Discharge status: Home / Self Care | Payer: Self-pay | Source: Home / Self Care | Attending: Internal Medicine

## 2018-08-13 ENCOUNTER — Inpatient Hospital Stay (HOSPITAL_COMMUNITY): Payer: Medicare HMO | Admitting: Certified Registered Nurse Anesthetist

## 2018-08-13 ENCOUNTER — Encounter (HOSPITAL_COMMUNITY): Payer: Self-pay

## 2018-08-13 ENCOUNTER — Ambulatory Visit (HOSPITAL_COMMUNITY): Admission: RE | Admit: 2018-08-13 | Payer: Medicare HMO | Source: Ambulatory Visit | Admitting: Cardiology

## 2018-08-13 HISTORY — PX: CARDIOVERSION: SHX1299

## 2018-08-13 LAB — BASIC METABOLIC PANEL
Anion gap: 8 (ref 5–15)
BUN: 18 mg/dL (ref 6–20)
CALCIUM: 8.7 mg/dL — AB (ref 8.9–10.3)
CHLORIDE: 104 mmol/L (ref 98–111)
CO2: 27 mmol/L (ref 22–32)
CREATININE: 1.24 mg/dL (ref 0.61–1.24)
GFR calc Af Amer: >60 mL/min (ref >60)
GFR calc non Af Amer: >60 mL/min (ref >60)
GLUCOSE: 132 mg/dL — AB (ref 70–99)
Potassium: 3.7 mmol/L (ref 3.5–5.1)
Sodium: 139 mmol/L (ref 135–145)

## 2018-08-13 LAB — CBC
HCT: 32.7 % — ABNORMAL LOW (ref 39.0–52.0)
Hemoglobin: 10.6 g/dL — ABNORMAL LOW (ref 13.0–17.0)
MCH: 26.9 pg (ref 26.0–34.0)
MCHC: 32.4 g/dL (ref 30.0–36.0)
MCV: 83 fL (ref 78.0–100.0)
PLATELETS: 257 10*3/uL (ref 150–400)
RBC: 3.94 MIL/uL — ABNORMAL LOW (ref 4.22–5.81)
RDW: 13.2 % (ref 11.5–15.5)
WBC: 7.2 10*3/uL (ref 4.0–10.5)

## 2018-08-13 LAB — GLUCOSE, CAPILLARY
GLUCOSE-CAPILLARY: 130 mg/dL — AB (ref 70–99)
GLUCOSE-CAPILLARY: 148 mg/dL — AB (ref 70–99)
Glucose-Capillary: 125 mg/dL — ABNORMAL HIGH (ref 70–99)
Glucose-Capillary: 313 mg/dL — ABNORMAL HIGH (ref 70–99)

## 2018-08-13 LAB — URIC ACID: URIC ACID, SERUM: 8.9 mg/dL — AB (ref 3.7–8.6)

## 2018-08-13 SURGERY — CARDIOVERSION
Anesthesia: General

## 2018-08-13 MED ORDER — LIDOCAINE HCL (CARDIAC) PF 100 MG/5ML IV SOSY
PREFILLED_SYRINGE | INTRAVENOUS | Status: DC | PRN
  Administered 2018-08-13: 80 mg via INTRATRACHEAL

## 2018-08-13 MED ORDER — PREDNISONE 20 MG PO TABS
40.0000 mg | ORAL_TABLET | Freq: Every day | ORAL | Status: DC
  Administered 2018-08-13 – 2018-08-14 (×2): 40 mg via ORAL
  Filled 2018-08-13 (×2): qty 2

## 2018-08-13 MED ORDER — PROPOFOL 10 MG/ML IV BOLUS
INTRAVENOUS | Status: DC | PRN
  Administered 2018-08-13: 80 mg via INTRAVENOUS

## 2018-08-13 NOTE — Anesthesia Preprocedure Evaluation (Addendum)
Anesthesia Evaluation  Patient identified by MRN, date of birth, ID band Patient awake    Reviewed: Allergy & Precautions, NPO status , Patient's Chart, lab work & pertinent test results  Airway Mallampati: IV  TM Distance: >3 FB Neck ROM: Full    Dental no notable dental hx.    Pulmonary sleep apnea and Continuous Positive Airway Pressure Ventilation ,    Pulmonary exam normal breath sounds clear to auscultation       Cardiovascular hypertension, Pt. on medications and Pt. on home beta blockers +CHF  Normal cardiovascular exam Rhythm:Regular Rate:Normal  ECG: A-fib, rate 113  ECHO: Left ventricle: The cavity size was normal. Wall thickness was increased in a pattern of mild LVH. Systolic function was severely reduced. The estimated ejection fraction was 20%. Diffuse hypokinesis.     Neuro/Psych negative neurological ROS  negative psych ROS   GI/Hepatic negative GI ROS, Neg liver ROS,   Endo/Other  diabetes, Oral Hypoglycemic AgentsMorbid obesity  Renal/GU negative Renal ROS     Musculoskeletal negative musculoskeletal ROS (+)   Abdominal (+) + obese,   Peds  Hematology  (+) anemia ,   Anesthesia Other Findings A-FIB with RVR  Reproductive/Obstetrics                            Anesthesia Physical Anesthesia Plan  ASA: IV  Anesthesia Plan: General   Post-op Pain Management:    Induction: Intravenous  PONV Risk Score and Plan: 2 and Propofol infusion and Treatment may vary due to age or medical condition  Airway Management Planned: Mask  Additional Equipment:   Intra-op Plan:   Post-operative Plan:   Informed Consent: I have reviewed the patients History and Physical, chart, labs and discussed the procedure including the risks, benefits and alternatives for the proposed anesthesia with the patient or authorized representative who has indicated his/her understanding and  acceptance.   Dental advisory given  Plan Discussed with: CRNA  Anesthesia Plan Comments:         Anesthesia Quick Evaluation

## 2018-08-13 NOTE — Anesthesia Postprocedure Evaluation (Signed)
Anesthesia Post Note  Patient: Juan Snyder  Procedure(s) Performed: CARDIOVERSION (N/A )     Patient location during evaluation: PACU Anesthesia Type: General Level of consciousness: awake and alert Pain management: pain level controlled Vital Signs Assessment: post-procedure vital signs reviewed and stable Respiratory status: spontaneous breathing, nonlabored ventilation, respiratory function stable and patient connected to nasal cannula oxygen Cardiovascular status: blood pressure returned to baseline and stable Postop Assessment: no apparent nausea or vomiting Anesthetic complications: no    Last Vitals:  Vitals:   08/13/18 1335 08/13/18 1707  BP: 122/81 (!) 112/56  Pulse: 85   Resp: 16 19  Temp:  36.8 C  SpO2: 98%     Last Pain:  Vitals:   08/13/18 1840  TempSrc:   PainSc: 3                  Ryan P Ellender

## 2018-08-13 NOTE — Progress Notes (Signed)
PROGRESS NOTE [facility] Team 1 SDU/ICU    Juan Snyder   WJX:[id number] DOB: Sep 14, 1966  DOA: 08/10/2018 PCP: Jackie Plum, MD   Brief Narrative:  Juan Snyder 52 year old male with medical history of A. fib, CHF, diabetes mellitus type 2, hypertension presented to the emergency department complaining of shortness of breath, orthopnea and dyspnea on exertion.  Upon ED evaluation he was found to be in A. fib with RVR and chest x-ray confirmed pulmonary edema.  Patient was admitted with working diagnosis of CHF exacerbation with A. fib with RVR.  Subjective: Patient seen and examined, today complaining of left and right foot pain mainly on the big toe radiating to his soles.  Patient reported the pain started couple of days ago and having some swelling and difficult to stand on his feet.  Regarding his breathing he still somewhat short of breath especially with activity.  Patient remains in A. fib.  Heart rate is okay controlled.  Assessment & Plan: Acute systolic CHF  Patient underwent cardiac cath 9/24 non ischemic cardiomyopathy, EF 20 % down from 68 in 2018. Felt to be related to arrhythmia from A. fib with RVR.  Patient on Aldactone 25 mg daily, Lasix 40 mg daily, metoprolol 100 mg daily and benazepril 20 mg daily. Continue strict INO's, daily weights and low-salt diet.   A. fib with RVR.  Patient initially treated with Cardizem drip, switched to metoprolol 100 mg daily and started on amiodarone 400 mg twice daily.  EP was consulted and no candidate for ablation.  He has been scheduled for TEE cardioversion today.  Continue current regimen and follow cardiology recommendations.  Hypertension BP stable, continue current regimen  Left foot/toe pain History consistent with possible gout, will start prednisone 40 mg daily x3 days.  Not ideal candidate for NSAIDs or colchicine given CHF and anticoagulation  Type 2 diabetes mellitus CBGs stable, on metformin at  home, holding here. Continue SSI and monitor CBGs  CKD stage II Creatinine at baseline, mild bump from aggressive diuresis expected.  Monitor renal function avoid nephrotoxic agents.  DVT prophylaxis: Xarelto Code Status: Full code Family Communication: Family at bedside Disposition Plan: SDU  Consultants:   Cardiology  EP  Procedures:   cardiac cath 9/24  Antimicrobials:  None   Objective: Vitals:   08/13/18 0020 08/13/18 0408 08/13/18 0700 08/13/18 0758  BP: 101/80 111/79  (!) 121/92  Pulse: 100 88  86  Resp:  20  16  Temp: 98.6 F (37 C) 98.6 F (37 C)  98.5 F (36.9 C)  TempSrc: Oral   Oral  SpO2: 97% 98%  93%  Weight:   129.3 kg   Height:        Intake/Output Summary (Last 24 hours) at 08/13/2018 1025 Last data filed at 08/13/2018 0807 Gross per 24 hour  Intake 480 ml  Output 1150 ml  Net -670 ml   Filed Weights   08/10/18 1733 08/11/18 0333 08/13/18 0700  Weight: 133.7 kg 132.5 kg 129.3 kg    Examination:  General exam: NAD Respiratory system: Clear to auscultation. No wheezes,crackle or rhonchi Cardiovascular system: Irregular irregular, S1-S2, no murmurs Gastrointestinal system: Abdomen is nondistended, soft and nontender. Central nervous system: Alert and oriented. No focal neurological deficits. Extremities: Left foot/big toe tenderness and swelling, pain radiating to soles.  Mild tenderness on right big toe.  Pulses intact Skin: No rashes, lesions or ulcers Psychiatry: Mood & affect appropriate.    Data Reviewed: I have personally  reviewed following labs and imaging studies  CBC: Recent Labs  Lab 08/10/18 0344 08/10/18 0352 08/11/18 0952 08/13/18 0245  WBC 7.8  --  8.5 7.2  NEUTROABS 5.9  --   --   --   HGB 10.7* 10.5* 10.6* 10.6*  HCT 31.7* 31.0* 32.7* 32.7*  MCV 80.9  --  82.8 83.0  PLT 262  --  234 257   Basic Metabolic Panel: Recent Labs  Lab 08/10/18 0352 08/11/18 0307 08/11/18 1441 08/12/18 0348 08/13/18 0245  NA  139 139  --  138 139  K 3.9 3.9  --  3.8 3.7  CL 105 101  --  101 104  CO2  --  27  --  28 27  GLUCOSE 156* 150*  --  138* 132*  BUN 18 17  --  19 18  CREATININE 1.00 1.21 1.10 1.21 1.24  CALCIUM  --  9.0  --  9.0 8.7*   GFR: Estimated Creatinine Clearance: 90.4 mL/min (by C-G formula based on SCr of 1.24 mg/dL). Liver Function Tests: Recent Labs  Lab 08/12/18 0348  AST 16  ALT 21  ALKPHOS 42  BILITOT 1.7*  PROT 6.2*  ALBUMIN 3.1*   No results for input(s): LIPASE, AMYLASE in the last 168 hours. No results for input(s): AMMONIA in the last 168 hours. Coagulation Profile: No results for input(s): INR, PROTIME in the last 168 hours. Cardiac Enzymes: No results for input(s): CKTOTAL, CKMB, CKMBINDEX, TROPONINI in the last 168 hours. BNP (last 3 results) No results for input(s): PROBNP in the last 8760 hours. HbA1C: Recent Labs    08/11/18 0307  HGBA1C 7.5*   CBG: Recent Labs  Lab 08/12/18 0756 08/12/18 1147 08/12/18 1715 08/12/18 2141 08/13/18 0809  GLUCAP 130* 177* 136* 187* 125*   Lipid Profile: No results for input(s): CHOL, HDL, LDLCALC, TRIG, CHOLHDL, LDLDIRECT in the last 72 hours. Thyroid Function Tests: Recent Labs    08/12/18 0348  TSH 1.213   Anemia Panel: No results for input(s): VITAMINB12, FOLATE, FERRITIN, TIBC, IRON, RETICCTPCT in the last 72 hours. Sepsis Labs: No results for input(s): PROCALCITON, LATICACIDVEN in the last 168 hours.  Recent Results (from the past 240 hour(s))  MRSA PCR Screening     Status: None   Collection Time: 08/10/18  6:23 AM  Result Value Ref Range Status   MRSA by PCR NEGATIVE NEGATIVE Final    Comment:        The GeneXpert MRSA Assay (FDA approved for NASAL specimens only), is one component of a comprehensive MRSA colonization surveillance program. It is not intended to diagnose MRSA infection nor to guide or monitor treatment for MRSA infections. Performed at [facility], [address] [place], [place] [address]       Radiology Studies: No results found.    Scheduled Meds: . amiodarone  400 mg Oral BID  . benazepril  20 mg Oral Daily  . furosemide  40 mg Oral BID  . insulin aspart  0-5 Units Subcutaneous QHS  . insulin aspart  0-9 Units Subcutaneous TID WC  . metoprolol succinate  100 mg Oral Daily  . polyethylene glycol  17 g Oral Daily  . rivaroxaban  20 mg Oral Daily  . senna-docusate  1 tablet Oral BID  . sodium chloride flush  3 mL Intravenous Q12H  . spironolactone  25 mg Oral Daily  . terazosin  5 mg Oral QPM   Continuous Infusions: . sodium chloride    .  sodium chloride       LOS: 2 days    Time spent: Total of 25 minutes spent with pt, greater than 50% of which was spent in discussion of  treatment, counseling and coordination of care  Latrelle Dodrill, MD Pager: Text Page via [email or web address]   If 7PM-7AM, please contact night-coverage [email or web address] 08/13/2018, 10:25 AM   Note - This record has been created using [employer]. Chart creation errors have been sought, but may not always have been located. Such creation errors do not reflect on the standard of medical care.

## 2018-08-13 NOTE — CV Procedure (Signed)
Direct current cardioversion:  Indication symptomatic A. Fibrillation.  Procedure: Under deep sedation administered and monitored by anesthesiology, synchronized direct current cardioversion performed. Patient was delivered with 120 Joules of electricity X 1 with success to NSR. Patient tolerated the procedure well. No immediate complication noted.   Manish J Patwardhan, MD [facility] Pager: [phone] Office: [phone] If no answer Cell [phone]    

## 2018-08-13 NOTE — Transfer of Care (Signed)
Immediate Anesthesia Transfer of Care Note  Patient: Juan Snyder  Procedure(s) Performed: CARDIOVERSION (N/A )  Patient Location: Endoscopy Unit  Anesthesia Type:General  Level of Consciousness: awake, alert , oriented and patient cooperative  Airway & Oxygen Therapy: Patient Spontanous Breathing and Patient connected to nasal cannula oxygen  Post-op Assessment: Report given to RN and Post -op Vital signs reviewed and stable  Post vital signs: Reviewed and stable  Last Vitals:  Vitals Value Taken Time  BP    Temp    Pulse    Resp    SpO2      Last Pain:  Vitals:   08/13/18 1233  TempSrc: Oral  PainSc: 3       Patients Stated Pain Goal: 7 (08/12/18 2143)  Complications: No apparent anesthesia complications

## 2018-08-13 NOTE — Progress Notes (Signed)
Successful cardioversion. Can be discharged on metoprolol succinate 100 mg daily, benazepril 20 mg daily, spironolactone 25 mg daily (or higher dose as necessary to control BP), amiodarone 400 mg bid for 1 week. Will arrange outpatient follow up.  Elder Negus, MD [facility] Pager: [phone] Office: [phone] If no answer Cell [phone]

## 2018-08-13 NOTE — Plan of Care (Signed)
Patient in NSR after cardioversion. Pain is controlled is with PRN medication. Will continue to monitor

## 2018-08-13 NOTE — Interval H&P Note (Signed)
History and Physical Interval Note:  08/13/2018 1:09 PM  Juan Snyder  has presented today for surgery, with the diagnosis of AFIB  The various methods of treatment have been discussed with the patient and family. After consideration of risks, benefits and other options for treatment, the patient has consented to  Procedure(s): CARDIOVERSION (N/A) as a surgical intervention .  The patient's history has been reviewed, patient examined, no change in status, stable for surgery.  I have reviewed the patient's chart and labs.  Questions were answered to the patient's satisfaction.     Verlyn Lambert J Finley Dinkel

## 2018-08-13 NOTE — Progress Notes (Signed)
Pt. States he can place the bipap on when he is ready for bed.

## 2018-08-14 ENCOUNTER — Encounter (HOSPITAL_COMMUNITY): Payer: Self-pay | Admitting: Cardiology

## 2018-08-14 LAB — BASIC METABOLIC PANEL
Anion gap: 5 (ref 5–15)
BUN: 20 mg/dL (ref 6–20)
CALCIUM: 9.1 mg/dL (ref 8.9–10.3)
CO2: 26 mmol/L (ref 22–32)
CREATININE: 1.08 mg/dL (ref 0.61–1.24)
Chloride: 106 mmol/L (ref 98–111)
GFR calc Af Amer: >60 mL/min (ref >60)
GLUCOSE: 204 mg/dL — AB (ref 70–99)
Potassium: 4.5 mmol/L (ref 3.5–5.1)
Sodium: 137 mmol/L (ref 135–145)

## 2018-08-14 LAB — GLUCOSE, CAPILLARY
Glucose-Capillary: 192 mg/dL — ABNORMAL HIGH (ref 70–99)
Glucose-Capillary: 203 mg/dL — ABNORMAL HIGH (ref 70–99)

## 2018-08-14 MED ORDER — SPIRONOLACTONE 25 MG PO TABS: 25.0000 mg | ORAL_TABLET | Freq: Every day | ORAL | 0 refills | Status: DC

## 2018-08-14 MED ORDER — PREDNISONE 20 MG PO TABS: 40.0000 mg | ORAL_TABLET | Freq: Every day | ORAL | 0 refills | Status: DC

## 2018-08-14 MED ORDER — TERAZOSIN HCL 5 MG PO CAPS: 5.0000 mg | ORAL_CAPSULE | Freq: Every evening | ORAL | 0 refills | Status: DC

## 2018-08-14 MED ORDER — FUROSEMIDE 40 MG PO TABS: 40.0000 mg | ORAL_TABLET | Freq: Two times a day (BID) | ORAL | 0 refills | Status: DC

## 2018-08-14 MED ORDER — METOPROLOL SUCCINATE ER 100 MG PO TB24: 100.0000 mg | ORAL_TABLET | Freq: Every day | ORAL | 0 refills | Status: AC

## 2018-08-14 MED ORDER — AMIODARONE HCL 400 MG PO TABS: 400.0000 mg | ORAL_TABLET | Freq: Two times a day (BID) | ORAL | 0 refills | Status: DC

## 2018-08-14 NOTE — Discharge Summary (Signed)
Physician Discharge Summary  Juan Snyder  ZOX:[id number]  DOB: [birth year]  DOA: 08/10/2018 PCP: Jackie Plum, MD  Admit date: 08/10/2018 Discharge date: 08/14/2018  Admitted From: Home  Disposition: Home   Recommendations for Outpatient Follow-up:  1. Follow up with PCP in 1-2 weeks 2. Follow up with cardiology in 2-3 weeks  3. Please obtain BMP/CBC in one week to monitor renal function and hemoglobin  Discharge Condition: Stable CODE STATUS: Full code Diet recommendation: Low salt Heart Healthy   Brief/Interim Summary: For full details see H&P/Progress note, but in brief, Juan Snyder is a 52 year old male with medical history of A. fib, CHF, diabetes mellitus type 2, hypertension presented to the emergency department complaining of shortness of breath, orthopnea and dyspnea on exertion.  Upon ED evaluation he was found to be in A. fib with RVR and chest x-ray confirmed pulmonary edema.  Patient was admitted with working diagnosis of CHF exacerbation complicated by A. fib with RVR.  Subjective: Patient seen and examined, has no complaints today.  He is foot/toe pain has improved.  Successfully cardioverted and remains on normal sinus rhythm.  No acute events overnight.  Remains afebrile.  Discharge Diagnoses/Hospital Course:  Acute systolic CHF, NYHA class IV Patient underwent cardiac cath 9/24 shows non ischemic cardiomyopathy, EF 20 % down from 68 in 2018. Felt to be related to arrhythmia from A. fib with RVR.  Patient discharged on Aldactone 25 mg daily, Lasix 40 mg BID, metoprolol 100 mg daily and benazepril 20 mg daily.  Encouraged low-salt diet and monitor daily weights.  Follow-up with cardiology.  A. fib with RVR.  Patient initially treated with Cardizem drip, switched to metoprolol 100 mg daily and started on amiodarone 400 mg twice daily.  EP was consulted and no candidate for ablation.  Despite medical treatment patient continued on A. fib with RVR and  symptomatic. Cardiology performed TEE cardioversion and was successfully converted to NSR.  Patient remained at in normal sinus rhythm with no symptoms.  Patient is to continue metoprolol 100 mg daily and amiodarone 400 mg twice daily x1 week and follow-up with cardiology for further recommendations as an outpatient.  Continue Xarelto for anticoagulation.  Hypertension Be stable during hospital stay, continue medications mentioned above.  Prior admission was on diltiazem this has been discontinued due to low EF.  Left foot/toe pain - ? Gout, improved History consistent with possible gout, patient was started on prednisone 40 mg with improvement of pain and decrease in swelling.  Will treat for total of 3 days. Patient not ideal candidate for NSAID or colchicine given CHF and anticoagulation therapy.  Type 2 diabetes mellitus CBGs stable during hospital stay, patient on metformin, this was held and treated with SSI.  Okay to resume home regimen with no changes at this time.  CKD stage II Creatinine at baseline, mild bump from aggressive diuresis expected.  Monitor renal function avoid nephrotoxic agents.  All other chronic medical condition were stable during the hospitalization.  On the day of the discharge the patient's vitals were stable, and no other acute medical condition were reported by patient. the patient was felt safe to be discharge to home  Discharge Instructions  You were cared for by a hospitalist during your hospital stay. If you have any questions about your discharge medications or the care you received while you were in the hospital after you are discharged, you can call the unit and asked to speak with the hospitalist on call if the hospitalist  that took care of you is not available. Once you are discharged, your primary care physician will handle any further medical issues. Please note that NO REFILLS for any discharge medications will be authorized once you are discharged,  as it is imperative that you return to your primary care physician (or establish a relationship with a primary care physician if you do not have one) for your aftercare needs so that they can reassess your need for medications and monitor your lab values.  Discharge Instructions    (HEART FAILURE PATIENTS) Call MD:  Anytime you have any of the following symptoms: 1) 3 pound weight gain in 24 hours or 5 pounds in 1 week 2) shortness of breath, with or without a dry hacking cough 3) swelling in the hands, feet or stomach 4) if you have to sleep on extra pillows at night in order to breathe.   Complete by:  As directed    Avoid straining   Complete by:  As directed    Call MD for:  difficulty breathing, headache or visual disturbances   Complete by:  As directed    Call MD for:  extreme fatigue   Complete by:  As directed    Call MD for:  hives   Complete by:  As directed    Call MD for:  persistant dizziness or light-headedness   Complete by:  As directed    Call MD for:  persistant nausea and vomiting   Complete by:  As directed    Call MD for:  redness, tenderness, or signs of infection (pain, swelling, redness, odor or green/yellow discharge around incision site)   Complete by:  As directed    Call MD for:  severe uncontrolled pain   Complete by:  As directed    Call MD for:  temperature >100.4   Complete by:  As directed    Diet - low sodium heart healthy   Complete by:  As directed    Heart Failure patients record your daily weight using the same scale at the same time of day   Complete by:  As directed    Increase activity slowly   Complete by:  As directed    STOP any activity that causes chest pain, shortness of breath, dizziness, sweating, or exessive weakness   Complete by:  As directed      Allergies as of 08/14/2018   No Known Allergies     Medication List    STOP taking these medications   diltiazem 120 MG 24 hr capsule Commonly known as:  DILACOR XR   metoprolol  tartrate 25 MG tablet Commonly known as:  LOPRESSOR   sildenafil 50 MG tablet Commonly known as:  VIAGRA     TAKE these medications   albuterol 108 (90 Base) MCG/ACT inhaler Commonly known as:  PROVENTIL HFA;VENTOLIN HFA Inhale 1 puff into the lungs every 6 (six) hours as needed for wheezing or shortness of breath.   amiodarone 400 MG tablet Commonly known as:  PACERONE Take 1 tablet (400 mg total) by mouth 2 (two) times daily.   benazepril 20 MG tablet Commonly known as:  LOTENSIN Take 20 mg by mouth daily.   furosemide 40 MG tablet Commonly known as:  LASIX Take 1 tablet (40 mg total) by mouth 2 (two) times daily. What changed:  when to take this   metFORMIN 500 MG tablet Commonly known as:  GLUCOPHAGE Take 500-1,000 mg by mouth See admin instructions. 1,000 mg every morning,  500 mg every night   metoprolol succinate 100 MG 24 hr tablet Commonly known as:  TOPROL-XL Take 1 tablet (100 mg total) by mouth daily. Take with or immediately following a meal. Start taking on:  08/15/2018   MULTIVITAMIN PO Take 1 tablet by mouth daily.   oxyCODONE-acetaminophen 10-325 MG tablet Commonly known as:  PERCOCET Take 1 tablet by mouth every 4 (four) hours as needed for pain.   predniSONE 20 MG tablet Commonly known as:  DELTASONE Take 2 tablets (40 mg total) by mouth daily with breakfast. Start taking on:  08/15/2018   rivaroxaban 20 MG Tabs tablet Commonly known as:  XARELTO Take 20 mg by mouth daily.   spironolactone 25 MG tablet Commonly known as:  ALDACTONE Take 1 tablet (25 mg total) by mouth daily. Start taking on:  08/15/2018   terazosin 5 MG capsule Commonly known as:  HYTRIN Take 1 capsule (5 mg total) by mouth every evening.   tiZANidine 4 MG tablet Commonly known as:  ZANAFLEX Take 4 mg by mouth every 8 (eight) hours as needed for muscle spasms.      Follow-up Information    Osei-Bonsu, Greggory Stallion, MD. Schedule an appointment as soon as possible for a visit  in 1 week(s).   Specialty:  Internal Medicine Why:  Hospital follow-up Contact information: [address] [place] [address] [phone]        Elder Negus, MD. Schedule an appointment as soon as possible for a visit in 2 week(s).   Specialty:  Cardiology Contact information: [address] [place] [address] [phone]          No Known Allergies  Consultations: Cardiology  Procedures/Studies: Dg Chest 2 View  Result Date: 08/10/2018 CLINICAL DATA:  Worsening dyspnea. EXAM: CHEST - 2 VIEW COMPARISON:  04/25/2012 FINDINGS: Cardiomegaly with aortic atherosclerosis. Interval increase in interstitial edema and central vascular congestion since prior. Osteoarthritis of the AC joints bilaterally with undersurface spurring. No acute nor suspicious osseous abnormalities. IMPRESSION: Cardiomegaly with interstitial pulmonary edema. Electronically Signed   By: Tollie Eth M.D.   On: 08/10/2018 03:17   ECHO 9/22 ------------------------------------------------------------------- Study Conclusions  - Left ventricle: The cavity size was normal. Wall thickness was   increased in a pattern of mild LVH. Systolic function was   severely reduced. The estimated ejection fraction was 20%.   Diffuse hypokinesis. - Mitral valve: Calcified annulus. There was moderate   regurgitation. - Left atrium: The atrium was mildly dilated. - Atrial septum: No defect or patent foramen ovale was identified. - Pulmonary arteries: PA peak pressure: 43 mm Hg (S).  Impressions:  - The right ventricular systolic pressure was increased consistent   with moderate pulmonary hypertension.  Cardiac Cath 9/24 Left dominant circulation with no significant CAD Mildly elevated LVEDP (17 mmHg) Moderate WHO Grp II PH (Mean PA 33 mmHg) Mildly decompensated arrhythmia induced nonischemic cardiomyopathy. Continue guideline directed heart failure therapy, and management of Afib.  Discharge  Exam: Vitals:   08/14/18 0501 08/14/18 0757  BP: 116/77 121/80  Pulse: (!) 105 64  Resp: (!) 24 18  Temp: 98.7 F (37.1 C) 98.1 F (36.7 C)  SpO2: 98% 94%   Vitals:   08/14/18 0035 08/14/18 0300 08/14/18 0501 08/14/18 0757  BP:   116/77 121/80  Pulse: 80 70 (!) 105 64  Resp: 17 14 (!) 24 18  Temp:   98.7 F (37.1 C) 98.1 F (36.7 C)  TempSrc:   Oral Oral  SpO2:  96% 100% 98% 94%  Weight:   129.9 kg   Height:        General: Pt is alert, awake, not in acute distress Cardiovascular: RRR, S1/S2 +, no rubs, no gallops Respiratory: CTA bilaterally, no wheezing, no rhonchi Abdominal: Soft, NT, ND, bowel sounds + Extremities: no edema, no cyanosis   The results of significant diagnostics from this hospitalization (including imaging, microbiology, ancillary and laboratory) are listed below for reference.     Microbiology: Recent Results (from the past 240 hour(s))  MRSA PCR Screening     Status: None   Collection Time: 08/10/18  6:23 AM  Result Value Ref Range Status   MRSA by PCR NEGATIVE NEGATIVE Final    Comment:        The GeneXpert MRSA Assay (FDA approved for NASAL specimens only), is one component of a comprehensive MRSA colonization surveillance program. It is not intended to diagnose MRSA infection nor to guide or monitor treatment for MRSA infections. Performed at [facility], [address], [place], [place] [address]      Labs: BNP (last 3 results) Recent Labs    08/10/18 0344  BNP 864.7*   Basic Metabolic Panel: Recent Labs  Lab 08/10/18 0352 08/11/18 0307 08/11/18 1441 08/12/18 0348 08/13/18 0245 08/14/18 0220  NA 139 139  --  138 139 137  K 3.9 3.9  --  3.8 3.7 4.5  CL 105 101  --  101 104 106  CO2  --  27  --  28 27 26   GLUCOSE 156* 150*  --  138* 132* 204*  BUN 18 17  --  19 18 20   CREATININE 1.00 1.21 1.10 1.21 1.24 1.08  CALCIUM  --  9.0  --  9.0 8.7* 9.1   Liver Function Tests: Recent Labs  Lab  08/12/18 0348  AST 16  ALT 21  ALKPHOS 42  BILITOT 1.7*  PROT 6.2*  ALBUMIN 3.1*   No results for input(s): LIPASE, AMYLASE in the last 168 hours. No results for input(s): AMMONIA in the last 168 hours. CBC: Recent Labs  Lab 08/10/18 0344 08/10/18 0352 08/11/18 0952 08/13/18 0245  WBC 7.8  --  8.5 7.2  NEUTROABS 5.9  --   --   --   HGB 10.7* 10.5* 10.6* 10.6*  HCT 31.7* 31.0* 32.7* 32.7*  MCV 80.9  --  82.8 83.0  PLT 262  --  234 257   Cardiac Enzymes: No results for input(s): CKTOTAL, CKMB, CKMBINDEX, TROPONINI in the last 168 hours. BNP: Invalid input(s): POCBNP CBG: Recent Labs  Lab 08/13/18 0809 08/13/18 1217 08/13/18 1709 08/13/18 2201 08/14/18 0755  GLUCAP 125* 130* 148* 313* 192*   D-Dimer No results for input(s): DDIMER in the last 72 hours. Hgb A1c No results for input(s): HGBA1C in the last 72 hours. Lipid Profile No results for input(s): CHOL, HDL, LDLCALC, TRIG, CHOLHDL, LDLDIRECT in the last 72 hours. Thyroid function studies Recent Labs    08/12/18 0348  TSH 1.213   Anemia work up No results for input(s): VITAMINB12, FOLATE, FERRITIN, TIBC, IRON, RETICCTPCT in the last 72 hours. Urinalysis    Component Value Date/Time   COLORURINE YELLOW 08/18/2009 1913   APPEARANCEUR CLEAR 08/18/2009 1913   LABSPEC 1.019 08/18/2009 1913   PHURINE 5.0 08/18/2009 1913   GLUCOSEU NEGATIVE 08/18/2009 1913   HGBUR NEGATIVE 08/18/2009 1913   BILIRUBINUR NEGATIVE 08/18/2009 1913   KETONESUR NEGATIVE 08/18/2009 1913   PROTEINUR NEGATIVE 08/18/2009 1913  UROBILINOGEN 0.2 08/18/2009 1913   NITRITE NEGATIVE 08/18/2009 1913   LEUKOCYTESUR  08/18/2009 1913    NEGATIVE MICROSCOPIC NOT DONE ON URINES WITH NEGATIVE PROTEIN, BLOOD, LEUKOCYTES, NITRITE, OR GLUCOSE <1000 mg/dL.   Sepsis Labs Invalid input(s): PROCALCITONIN,  WBC,  LACTICIDVEN Microbiology Recent Results (from the past 240 hour(s))  MRSA PCR Screening     Status: None   Collection Time: 08/10/18   6:23 AM  Result Value Ref Range Status   MRSA by PCR NEGATIVE NEGATIVE Final    Comment:        The GeneXpert MRSA Assay (FDA approved for NASAL specimens only), is one component of a comprehensive MRSA colonization surveillance program. It is not intended to diagnose MRSA infection nor to guide or monitor treatment for MRSA infections. Performed at [facility], [address] [place], [place] [address]      Time coordinating discharge: 35 minutes  SIGNED:  Latrelle Dodrill, MD  [facility] 08/14/2018, 10:17 AM  Pager please text page via  [email or web address]  Note - This record has been created using [employer]. Chart creation errors have been sought, but may not always have been located. Such creation errors do not reflect on the standard of medical care.

## 2018-08-14 NOTE — Plan of Care (Signed)

## 2018-08-14 NOTE — Care Management Important Message (Signed)
Important Message  Patient Details  Name: Juan Snyder MRN: [id number] Date of Birth: 15-Nov-1966   Medicare Important Message Given:  Yes    [name] [name] 08/14/2018, 4:12 PM

## 2018-08-14 NOTE — Care Management Note (Signed)
Case Management Note  Patient Details  Name: Juan Snyder MRN: [id number] Date of Birth: 1966-01-18  Subjective/Objective:  Pt admitted with CHF                    Action/Plan:  PTA independent from home.  Pt has PCP and denied barriers with obtaining  Medications.  Pt educated on daily weights (HF team provided scale) and low sodium diet.  No other CM needs determined at this time.     Expected Discharge Date:  08/14/18               Expected Discharge Plan:  Home/Self Care  In-House Referral:     Discharge planning Services  CM Consult  Post Acute Care Choice:    Choice offered to:     DME Arranged:    DME Agency:     HH Arranged:    HH Agency:     Status of Service:  Completed, signed off  If discussed at [employer] of Stay Meetings, dates discussed:    Additional Comments:  Cherylann Parr, RN 08/14/2018, 11:22 AM

## 2018-08-14 NOTE — Discharge Instructions (Signed)
Angiogram An angiogram is an X-ray test. It is used to look at your blood vessels. For this test, a dye is put into the blood vessel being checked. The dye shows up on X-rays. It helps your doctor see if there is a blockage or other problem in the blood vessel. What happens before the procedure?  Follow your doctor's instructions about limiting what you eat or drink.  Ask your doctor if you may drink enough water to take any needed medicines the morning of the test.  Plan to have someone take you home after the test.  If you go home the same day as the test, plan to have someone stay with you for 24 hours. What happens during the procedure?  An IV tube will be put into one of your veins.  You will be given a medicine that makes you relax (sedative).  Your skin will be washed where the thin tube (catheter) will be put in. Hair may be removed from this area. The tube may be put into: ? Your upper leg area (groin). ? The fold of your arm, near your elbow. ? Your wrist.  You will be given a medicine that numbs the area where the tube will be inserted (local anesthetic).  The tube will be inserted into a blood vessel.  Using a type of X-ray (fluoroscopy) to see, your doctor will move the tube into the blood vessel to check it.  Dye will be put in through the tube. X-rays of your blood vessels will then be taken. Different doctors and hospitals may do this procedure differently. What happens after the procedure?  If the test is done through the leg, you will be kept in bed lying flat for several hours. You will be told to not bend or cross your legs.  The area where the tube was inserted will be checked often.  The pulse in your feet or wrist will be checked often.  More tests or X-rays may be done. This information is not intended to replace advice given to you by your health care provider. Make sure you discuss any questions you have with your health care provider. Document  Released: 02/01/2009 Document Revised: 04/12/2016 Document Reviewed: 04/08/2013 Elsevier Interactive Patient Education  2017 [employer]   Aspirin and Your Heart Aspirin is a medicine that affects the way blood clots. Aspirin can be used to help reduce the risk of blood clots, heart attacks, and other heart-related problems. Should I take aspirin? Your health care provider will help you determine whether it is safe and beneficial for you to take aspirin daily. Taking aspirin daily may be beneficial if you:  Have had a heart attack or chest pain.  Have undergone open heart surgery such as coronary artery bypass surgery (CABG).  Have had coronary angioplasty.  Have experienced a stroke or transient ischemic attack (TIA).  Have peripheral vascular disease (PVD).  Have chronic heart rhythm problems such as atrial fibrillation.  Are there any risks of taking aspirin daily? Daily use of aspirin can increase your risk of side effects. Some of these include:  Bleeding. Bleeding problems can be minor or serious. An example of a minor problem is a cut that does not stop bleeding. An example of a more serious problem is stomach bleeding or bleeding into the brain. Your risk of bleeding is increased if you are also taking non-steroidal anti-inflammatory medicine (NSAIDs).  Increased bruising.  Upset stomach.  An allergic reaction. People who have nasal polyps  have an increased risk of developing an aspirin allergy.  What are some guidelines I should follow when taking aspirin?  Take aspirin only as directed by your health care provider. Make sure you understand how much you should take and what form you should take. The two forms of aspirin are: ? Non-enteric-coated. This type of aspirin does not have a coating and is absorbed quickly. Non-enteric-coated aspirin is usually recommended for people with chest pain. This type of aspirin also comes in a chewable form. ? Enteric-coated. This  type of aspirin has a special coating that releases the medicine very slowly. Enteric-coated aspirin causes less stomach upset than non-enteric-coated aspirin. This type of aspirin should not be chewed or crushed.  Drink alcohol in moderation. Drinking alcohol increases your risk of bleeding. When should I seek medical care?  You have unusual bleeding or bruising.  You have stomach pain.  You have an allergic reaction. Symptoms of an allergic reaction include: ? Hives. ? Itchy skin. ? Swelling of the lips, tongue, or face.  You have ringing in your ears. When should I seek immediate medical care?  Your bowel movements are bloody, dark red, or black in color.  You vomit or cough up blood.  You have blood in your urine.  You cough, wheeze, or feel short of breath. If you have any of the following symptoms, this is an emergency. Do not wait to see if the pain will go away. Get medical help at once. Call your local emergency services (911 in the [place] Do not drive yourself to the hospital.  You have severe chest pain, especially if the pain is crushing or pressure-like and spreads to the arms, back, neck, or jaw.  You have stroke-like symptoms, such as: ? Loss of vision. ? Difficulty talking. ? Numbness or weakness on one side of your body. ? Numbness or weakness in your arm or leg. ? Not thinking clearly or feeling confused.  This information is not intended to replace advice given to you by your health care provider. Make sure you discuss any questions you have with your health care provider. Document Released: 10/18/2008 Document Revised: 03/14/2016 Document Reviewed: 02/10/2014 Elsevier Interactive Patient Education  2018 [employer].  Atrial Fibrillation Atrial fibrillation is a type of irregular or rapid heartbeat (arrhythmia). In atrial fibrillation, the heart quivers continuously in a chaotic pattern. This occurs when parts of the heart receive disorganized signals that  make the heart unable to pump blood normally. This can increase the risk for stroke, heart failure, and other heart-related conditions. There are different types of atrial fibrillation, including:  Paroxysmal atrial fibrillation. This type starts suddenly, and it usually stops on its own shortly after it starts.  Persistent atrial fibrillation. This type often lasts longer than a week. It may stop on its own or with treatment.  Long-lasting persistent atrial fibrillation. This type lasts longer than 12 months.  Permanent atrial fibrillation. This type does not go away.  Talk with your health care provider to learn about the type of atrial fibrillation that you have. What are the causes? This condition is caused by some heart-related conditions or procedures, including:  A heart attack.  Coronary artery disease.  Heart failure.  Heart valve conditions.  High blood pressure.  Inflammation of the sac that surrounds the heart (pericarditis).  Heart surgery.  Certain heart rhythm disorders, such as Wolf-Parkinson-White syndrome.  Other causes include:  Pneumonia.  Obstructive sleep apnea.  Blockage of an artery in the  lungs (pulmonary embolism, or PE).  Lung cancer.  Chronic lung disease.  Thyroid problems, especially if the thyroid is overactive (hyperthyroidism).  Caffeine.  Excessive alcohol use or illegal drug use.  Use of some medicines, including certain decongestants and diet pills.  Sometimes, the cause cannot be found. What increases the risk? This condition is more likely to develop in:  People who are older in age.  People who smoke.  People who have diabetes mellitus.  People who are overweight (obese).  Athletes who exercise vigorously.  What are the signs or symptoms? Symptoms of this condition include:  A feeling that your heart is beating rapidly or irregularly.  A feeling of discomfort or pain in your chest.  Shortness of  breath.  Sudden light-headedness or weakness.  Getting tired easily during exercise.  In some cases, there are no symptoms. How is this diagnosed? Your health care provider may be able to detect atrial fibrillation when taking your pulse. If detected, this condition may be diagnosed with:  An electrocardiogram (ECG).  A Holter monitor test that records your heartbeat patterns over a 24-hour period.  Transthoracic echocardiogram (TTE) to evaluate how blood flows through your heart.  Transesophageal echocardiogram (TEE) to view more detailed images of your heart.  A stress test.  Imaging tests, such as a CT scan or chest X-ray.  Blood tests.  How is this treated? The main goals of treatment are to prevent blood clots from forming and to keep your heart beating at a normal rate and rhythm. The type of treatment that you receive depends on many factors, such as your underlying medical conditions and how you feel when you are experiencing atrial fibrillation. This condition may be treated with:  Medicine to slow down the heart rate, bring the hearts rhythm back to normal, or prevent clots from forming.  Electrical cardioversion. This is a procedure that resets your hearts rhythm by delivering a controlled, low-energy shock to the heart through your skin.  Different types of ablation, such as catheter ablation, catheter ablation with pacemaker, or surgical ablation. These procedures destroy the heart tissues that send abnormal signals. When the pacemaker is used, it is placed under your skin to help your heart beat in a regular rhythm.  Follow these instructions at home:  Take over-the counter and prescription medicines only as told by your health care provider.  If your health care provider prescribed a blood-thinning medicine (anticoagulant), take it exactly as told. Taking too much blood-thinning medicine can cause bleeding. If you do not take enough blood-thinning medicine, you  will not have the protection that you need against stroke and other problems.  Do not use tobacco products, including cigarettes, chewing tobacco, and e-cigarettes. If you need help quitting, ask your health care provider.  If you have obstructive sleep apnea, manage your condition as told by your health care provider.  Do not drink alcohol.  Do not drink beverages that contain caffeine, such as coffee, soda, and tea.  Maintain a healthy weight. Do not use diet pills unless your health care provider approves. Diet pills may make heart problems worse.  Follow diet instructions as told by your health care provider.  Exercise regularly as told by your health care provider.  Keep all follow-up visits as told by your health care provider. This is important. How is this prevented?  Avoid drinking beverages that contain caffeine or alcohol.  Avoid certain medicines, especially medicines that are used for breathing problems.  Avoid certain herbs  and herbal medicines, such as those that contain ephedra or ginseng.  Do not use illegal drugs, such as cocaine and amphetamines.  Do not smoke.  Manage your high blood pressure. Contact a health care provider if:  You notice a change in the rate, rhythm, or strength of your heartbeat.  You are taking an anticoagulant and you notice increased bruising.  You tire more easily when you exercise or exert yourself. Get help right away if:  You have chest pain, abdominal pain, sweating, or weakness.  You feel nauseous.  You notice blood in your vomit, bowel movement, or urine.  You have shortness of breath.  You suddenly have swollen feet and ankles.  You feel dizzy.  You have sudden weakness or numbness of the face, arm, or leg, especially on one side of the body.  You have trouble speaking, trouble understanding, or both (aphasia).  Your face or your eyelid droops on one side. These symptoms may represent a serious problem that is  an emergency. Do not wait to see if the symptoms will go away. Get medical help right away. Call your local emergency services (911 in the [place] Do not drive yourself to the hospital. This information is not intended to replace advice given to you by your health care provider. Make sure you discuss any questions you have with your health care provider. Document Released: 11/05/2005 Document Revised: 03/14/2016 Document Reviewed: 03/02/2015 Elsevier Interactive Patient Education  [employer].

## 2018-08-27 DIAGNOSIS — E559 Vitamin D deficiency, unspecified: Secondary | ICD-10-CM | POA: Diagnosis not present

## 2018-08-27 DIAGNOSIS — G894 Chronic pain syndrome: Secondary | ICD-10-CM | POA: Diagnosis not present

## 2018-08-27 DIAGNOSIS — E785 Hyperlipidemia, unspecified: Secondary | ICD-10-CM | POA: Diagnosis not present

## 2018-08-27 DIAGNOSIS — I10 Essential (primary) hypertension: Secondary | ICD-10-CM | POA: Diagnosis not present

## 2018-08-27 DIAGNOSIS — Z23 Encounter for immunization: Secondary | ICD-10-CM | POA: Diagnosis not present

## 2018-08-27 DIAGNOSIS — E1165 Type 2 diabetes mellitus with hyperglycemia: Secondary | ICD-10-CM | POA: Diagnosis not present

## 2018-08-27 DIAGNOSIS — I119 Hypertensive heart disease without heart failure: Secondary | ICD-10-CM | POA: Diagnosis not present

## 2018-09-01 DIAGNOSIS — I428 Other cardiomyopathies: Secondary | ICD-10-CM | POA: Diagnosis not present

## 2018-09-01 DIAGNOSIS — K921 Melena: Secondary | ICD-10-CM | POA: Diagnosis not present

## 2018-09-03 DIAGNOSIS — E7800 Pure hypercholesterolemia, unspecified: Secondary | ICD-10-CM | POA: Diagnosis not present

## 2018-09-03 DIAGNOSIS — E119 Type 2 diabetes mellitus without complications: Secondary | ICD-10-CM | POA: Diagnosis not present

## 2018-09-03 DIAGNOSIS — I480 Paroxysmal atrial fibrillation: Secondary | ICD-10-CM | POA: Diagnosis not present

## 2018-09-03 DIAGNOSIS — I10 Essential (primary) hypertension: Secondary | ICD-10-CM | POA: Diagnosis not present

## 2018-09-24 DIAGNOSIS — I480 Paroxysmal atrial fibrillation: Secondary | ICD-10-CM | POA: Diagnosis not present

## 2018-09-24 DIAGNOSIS — E7800 Pure hypercholesterolemia, unspecified: Secondary | ICD-10-CM | POA: Diagnosis not present

## 2018-09-24 DIAGNOSIS — I10 Essential (primary) hypertension: Secondary | ICD-10-CM | POA: Diagnosis not present

## 2018-09-24 DIAGNOSIS — E119 Type 2 diabetes mellitus without complications: Secondary | ICD-10-CM | POA: Diagnosis not present

## 2018-09-25 ENCOUNTER — Encounter: Payer: Self-pay | Admitting: Neurology

## 2018-09-25 ENCOUNTER — Encounter

## 2018-09-25 ENCOUNTER — Ambulatory Visit (INDEPENDENT_AMBULATORY_CARE_PROVIDER_SITE_OTHER): Payer: Medicare HMO | Admitting: Neurology

## 2018-09-25 ENCOUNTER — Encounter (INDEPENDENT_AMBULATORY_CARE_PROVIDER_SITE_OTHER): Payer: Self-pay

## 2018-09-25 VITALS — BP 158/98 | HR 71 | Ht 68.0 in | Wt 285.0 lb

## 2018-09-25 DIAGNOSIS — F1190 Opioid use, unspecified, uncomplicated: Secondary | ICD-10-CM

## 2018-09-25 DIAGNOSIS — I5022 Chronic systolic (congestive) heart failure: Secondary | ICD-10-CM | POA: Diagnosis not present

## 2018-09-25 DIAGNOSIS — Z9889 Other specified postprocedural states: Secondary | ICD-10-CM | POA: Diagnosis not present

## 2018-09-25 DIAGNOSIS — R351 Nocturia: Secondary | ICD-10-CM | POA: Diagnosis not present

## 2018-09-25 DIAGNOSIS — R51 Headache: Secondary | ICD-10-CM | POA: Diagnosis not present

## 2018-09-25 DIAGNOSIS — I480 Paroxysmal atrial fibrillation: Secondary | ICD-10-CM | POA: Diagnosis not present

## 2018-09-25 DIAGNOSIS — G4733 Obstructive sleep apnea (adult) (pediatric): Secondary | ICD-10-CM

## 2018-09-25 DIAGNOSIS — R519 Headache, unspecified: Secondary | ICD-10-CM

## 2018-09-25 NOTE — Progress Notes (Signed)
Subjective:    Patient ID: Juan Snyder is a 52 y.o. male.  HPI    Huston Foley, MD, PhD [facility] [address] [address] [place], [place] [address]  Dear Dr. Rosemary Holms,   I saw your patient, Juan Snyder, upon your kind request, in my sleep clinic today for initial consultation of his sleep disorder, in particular, concern for underlying obstructive sleep apnea. The patient is unaccompanied today. As you know, Juan Snyder is a 52 year old right-handed gentleman with an underlying medical history of diabetes, hypertension, CHF, paroxysmal A. fib with status post conversion, and morbid obesity with a BMI of over 40, who reports snoring and excessive daytime somnolence. He was previously diagnosed with obstructive sleep apnea and placed on CPAP therapy. His machine is broken. I reviewed your office note from 07/30/2018, which you kindly included. He has gained weight over time. Sleep study testing was about 6 years ago, he was told he had severe OSA, and recalls, that his pressure was 16 cm. Originally, he was diagnosed in 2008. He had a ResMed S9, but the humidifier broke earlier this year and about 1 or 2 months ago his machine broke altogether. He has not been on treatment since then. He reports daytime somnolence and difficulty sleeping at night. He felt better while on CPAP. He has had cardioversion in late September 2019. He had a recent checkup from the cardiac standpoint. He still has shortness of breath but overall feels better, swelling in his legs is better as well. He has low back pain and uses a cane for ambulation. Overall, he has gained weight with time. He has lost a little bit during his last hospitalization. His Epworth sleepiness score is 13 out of 24, fatigue score is 51 out of 63. He is divorced, has 3 grown children, his son lives with him. Of note, he is on narcotic pain medication through pain management, takes 3 or 4 pills per day on  average. He is also on a muscle relaxer. He is divorced and does not work. He drinks caffeine in the form of tea, about 1 a day on average, typically no sodas, does not drink coffee, is a nonsmoker and does not currently utilize any alcohol. He has nocturia about twice per average night and rare morning headaches, is not aware of any family history of sleep apnea.  His Past Medical History Is Significant For: Past Medical History:  Diagnosis Date  . Back pain, chronic   . CHF (congestive heart failure) (HCC)   . Diabetes mellitus   . Hypertension   . Obesity     His Past Surgical History Is Significant For: Past Surgical History:  Procedure Laterality Date  . CARDIOVERSION N/A 02/18/2018   Procedure: CARDIOVERSION;  Surgeon: Elder Negus, MD;  Location: [facility];  Service: Cardiovascular;  Laterality: N/A;  . CARDIOVERSION N/A 08/13/2018   Procedure: CARDIOVERSION;  Surgeon: Elder Negus, MD;  Location: [facility] ENDOS[facility];  Service: Cardiovascular;  Laterality: N/A;  . RIGHT/LEFT HEART CATH AND CORONARY ANGIOGRAPHY N/A 08/12/2018   Procedure: RIGHT/LEFT HEART CATH AND CORONARY ANGIOGRAPHY;  Surgeon: Elder Negus, MD;  Location: [facility] INVASIVE CV LAB;  Service: Cardiovascular;  Laterality: N/A;    His Family History Is Significant For: Family History  Problem Relation Age of Onset  . Hypertension Mother     His Social History Is Significant For: Social History   Socioeconomic History  . Marital status: Divorced    Spouse name:  Not on file  . Number of children: Not on file  . Years of education: Not on file  . Highest education level: Not on file  Occupational History  . Not on file  Social Needs  . Financial resource strain: Not hard at all  . Food insecurity:    Worry: Patient refused    Inability: Patient refused  . Transportation needs:    Medical: No    Non-medical: No  Tobacco Use  . Smoking status: Never Smoker  . Smokeless tobacco: Never Used   Substance and Sexual Activity  . Alcohol use: Yes  . Drug use: No  . Sexual activity: Not on file  Lifestyle  . Physical activity:    Days per week: 0 days    Minutes per session: 0 min  . Stress: Only a little  Relationships  . Social connections:    Talks on phone: Twice a week    Gets together: Twice a week    Attends religious service: 1 to 4 times per year    Active member of club or organization: No    Attends meetings of clubs or organizations: Never    Relationship status: Divorced  Other Topics Concern  . Not on file  Social History Narrative  . Not on file    His Allergies Are:  No Known Allergies:   His Current Medications Are:  Outpatient Encounter Medications as of 09/25/2018  Medication Sig  . albuterol (PROVENTIL HFA;VENTOLIN HFA) 108 (90 Base) MCG/ACT inhaler Inhale 1 puff into the lungs every 6 (six) hours as needed for wheezing or shortness of breath.  . allopurinol (ZYLOPRIM) 300 MG tablet Take 300 mg by mouth daily.  Marland Kitchen amiodarone (PACERONE) 200 MG tablet Take 200 mg by mouth 2 (two) times daily.  . benazepril (LOTENSIN) 20 MG tablet Take 20 mg by mouth daily.  Marland Kitchen diltiazem (DILACOR XR) 120 MG 24 hr capsule Take 120 mg by mouth daily.  . furosemide (LASIX) 40 MG tablet Take 1 tablet (40 mg total) by mouth 2 (two) times daily.  . metFORMIN (GLUCOPHAGE) 1000 MG tablet Take 1,000 mg by mouth 2 (two) times daily with a meal.  . metoprolol succinate (TOPROL-XL) 100 MG 24 hr tablet Take 1 tablet (100 mg total) by mouth daily. Take with or immediately following a meal.  . Multiple Vitamins-Minerals (MULTIVITAMIN PO) Take 1 tablet by mouth daily.  Marland Kitchen oxyCODONE-acetaminophen (PERCOCET) 10-325 MG tablet Take 1 tablet by mouth every 4 (four) hours as needed for pain.  . rivaroxaban (XARELTO) 20 MG TABS tablet Take 20 mg by mouth daily.   Marland Kitchen spironolactone (ALDACTONE) 25 MG tablet Take 1 tablet (25 mg total) by mouth daily.  . tamsulosin (FLOMAX) 0.4 MG CAPS capsule Take  0.4 mg by mouth daily.  Marland Kitchen terazosin (HYTRIN) 5 MG capsule Take 1 capsule (5 mg total) by mouth every evening.  Marland Kitchen tiZANidine (ZANAFLEX) 4 MG tablet Take 4 mg by mouth every 8 (eight) hours as needed for muscle spasms.   Marland Kitchen triamterene-hydrochlorothiazide (MAXZIDE-25) 37.5-25 MG tablet Take 1 tablet by mouth daily.  . [DISCONTINUED] amiodarone (PACERONE) 400 MG tablet Take 1 tablet (400 mg total) by mouth 2 (two) times daily.  . [DISCONTINUED] metFORMIN (GLUCOPHAGE) 500 MG tablet Take 500-1,000 mg by mouth See admin instructions. 1,000 mg every morning, 500 mg every night  . [DISCONTINUED] predniSONE (DELTASONE) 20 MG tablet Take 2 tablets (40 mg total) by mouth daily with breakfast.   No facility-administered encounter medications on file  as of 09/25/2018.   :  Review of Systems:  Out of a complete 14 point review of systems, all are reviewed and negative with the exception of these symptoms as listed below: Review of Systems  Neurological:       Pt presents today to discuss his sleep. Pt has had a sleep study in 2013 and was ordered a cpap. However, his cpap is broken and he did not bring it today.  Epworth Sleepiness Scale 0= would never doze 1= slight chance of dozing 2= moderate chance of dozing 3= high chance of dozing  Sitting and reading: 1 Watching TV: 2 Sitting inactive in a public place (ex. Theater or meeting): 2 As a passenger in a car for an hour without a break: 1 Lying down to rest in the afternoon: 3 Sitting and talking to someone: 1 Sitting quietly after lunch (no alcohol): 3 In a car, while stopped in traffic: 0 Total: 13     Objective:  Neurological Exam  Physical Exam Physical Examination:   Vitals:   09/25/18 1108  BP: (!) 158/98  Pulse: 71   General Examination: The patient is a very pleasant 52 y.o. male in no acute distress. He appears well-developed and well-nourished and well groomed.   HEENT: Normocephalic, atraumatic, pupils are equal, round  and reactive to light and accommodation. Extraocular tracking is good without limitation to gaze excursion or nystagmus noted. Normal smooth pursuit is noted. Hearing is grossly intact. Face is symmetric with normal facial animation and normal facial sensation. Speech is clear with no dysarthria noted. There is no hypophonia. There is no lip, neck/head, jaw or voice tremor. Neck is supple with full range of passive and active motion. There are no carotid bruits on auscultation. Oropharynx exam reveals: mild mouth dryness, adequate dental hygiene and moderate airway crowding, due to thicker soft palate, wider tongue, redundant soft palate, tonsils of 1-2+. Mallampati is class III. Neck circumference is 19 inches. He has a moderate overbite.  Chest: Clear to auscultation without wheezing, rhonchi or crackles noted.  Heart: S1+S2+0, regular and normal without murmurs, rubs or gallops noted.   Abdomen: Soft, non-tender and non-distended with normal bowel sounds appreciated on auscultation.  Extremities: There is trace pitting edema in the distal lower extremities bilaterally.   Skin: Warm and dry without trophic changes noted.  Musculoskeletal: exam reveals no obvious joint deformities, tenderness or joint swelling or erythema.   Neurologically:  Mental status: The patient is awake, alert and oriented in all 4 spheres. His immediate and remote memory, attention, language skills and fund of knowledge are appropriate. There is no evidence of aphasia, agnosia, apraxia or anomia. Speech is clear with normal prosody and enunciation. Thought process is linear. Mood is normal and affect is normal.  Cranial nerves II - XII are as described above under HEENT exam. In addition: shoulder shrug is normal with equal shoulder height noted. Motor exam: Normal bulk, strength and tone is noted. There is no drift, tremor or rebound. Romberg is not tested due to safety concerns. Fine motor skills and coordination: intact  with normal finger taps, normal hand movements, normal rapid alternating patting, normal foot taps and normal foot agility.  Cerebellar testing: No dysmetria or intention tremor on finger to nose testing. Heel to shin is unremarkable bilaterally. There is no truncal or gait ataxia.  Sensory exam: intact to light touch in the upper and lower extremities.  Gait, station and balance: He stands with difficulty and pushes himself up.  He walks slowly and cautiously, uses a single-point cane.  Assessment and Plan:  In summary, Juan Snyder is a very pleasant 52 y.o.-year old with an underlying medical history of diabetes, hypertension, CHF, paroxysmal A. fib with status post conversion, and morbid obesity with a BMI of over 40, who presents for evaluation of his obstructive sleep apnea. He was originally diagnosed with sleep apnea over 10 years ago, he needs reevaluation as his last sleep study was over 6 years ago and his machine is broken. He would benefit from a laboratory attended sleep study with CPAP titration in the form of a split-night sleep study. Of note, his morbid obesity and chronic use of narcotic pain medication makes his sleep disorder reading even higher risk.  I had a long chat with the patient about my findings and the diagnosis of OSA, its prognosis and treatment options. We talked about medical treatments, surgical interventions and non-pharmacological approaches. I explained in particular the risks and ramifications of untreated moderate to severe OSA, especially with respect to developing cardiovascular disease down the Road, including congestive heart failure, difficult to treat hypertension, cardiac arrhythmias, or stroke. Even type 2 diabetes has, in part, been linked to untreated OSA. Symptoms of untreated OSA include daytime sleepiness, memory problems, mood irritability and mood disorder such as depression and anxiety, lack of energy, as well as recurrent headaches, especially  morning headaches. We talked about trying to maintain a healthy lifestyle in general, as well as the importance of weight control. I encouraged the patient to eat healthy, exercise daily and keep well hydrated, to keep a scheduled bedtime and wake time routine, to not skip any meals and eat healthy snacks in between meals. I advised the patient not to drive when feeling sleepy. I recommended the following at this time: sleep study with potential positive airway pressure titration. (We will score hypopneas at 4% and split the sleep study into diagnostic and treatment portion, if the estimated. 2 hour AHI is >15/h).   I explained the sleep test procedure to the patient and also outlined possible surgical and non-surgical treatment options of OSA, including the use of a custom-made dental device (which would require a referral to a specialist dentist or oral surgeon), upper airway surgical options, such as pillar implants, radiofrequency surgery, tongue base surgery, and UPPP (which would involve a referral to an ENT surgeon). Rarely, jaw surgery such as mandibular advancement may be considered.  I also explained the CPAP treatment option to the patient, who indicated that he would be willing to use CPAP again if the need arises. I explained the importance of being compliant with PAP treatment, not only for insurance purposes but primarily to improve His symptoms, and for the patient's long term health benefit, including to reduce His cardiovascular risks. I answered all his questions today and the patient was in agreement. I would like to see him back after the sleep study is completed and encouraged him to call with any interim questions, concerns, problems or updates.   Thank you very much for allowing me to participate in the care of this nice patient. If I can be of any further assistance to you please do not hesitate to call me at [phone].  Sincerely,   Huston Foley, MD, PhD

## 2018-09-25 NOTE — Patient Instructions (Signed)

## 2018-09-29 DIAGNOSIS — I480 Paroxysmal atrial fibrillation: Secondary | ICD-10-CM | POA: Diagnosis not present

## 2018-10-01 DIAGNOSIS — K625 Hemorrhage of anus and rectum: Secondary | ICD-10-CM | POA: Diagnosis not present

## 2018-10-20 DIAGNOSIS — I480 Paroxysmal atrial fibrillation: Secondary | ICD-10-CM | POA: Diagnosis not present

## 2018-10-20 DIAGNOSIS — I10 Essential (primary) hypertension: Secondary | ICD-10-CM | POA: Diagnosis not present

## 2018-10-20 DIAGNOSIS — I509 Heart failure, unspecified: Secondary | ICD-10-CM | POA: Diagnosis not present

## 2018-10-20 DIAGNOSIS — I428 Other cardiomyopathies: Secondary | ICD-10-CM | POA: Diagnosis not present

## 2018-10-28 ENCOUNTER — Ambulatory Visit (INDEPENDENT_AMBULATORY_CARE_PROVIDER_SITE_OTHER): Payer: Medicare HMO | Admitting: Neurology

## 2018-10-28 DIAGNOSIS — R519 Headache, unspecified: Secondary | ICD-10-CM

## 2018-10-28 DIAGNOSIS — Z9989 Dependence on other enabling machines and devices: Secondary | ICD-10-CM

## 2018-10-28 DIAGNOSIS — Z9289 Personal history of other medical treatment: Secondary | ICD-10-CM

## 2018-10-28 DIAGNOSIS — G4739 Other sleep apnea: Secondary | ICD-10-CM

## 2018-10-28 DIAGNOSIS — Z9889 Other specified postprocedural states: Secondary | ICD-10-CM

## 2018-10-28 DIAGNOSIS — I480 Paroxysmal atrial fibrillation: Secondary | ICD-10-CM

## 2018-10-28 DIAGNOSIS — G4733 Obstructive sleep apnea (adult) (pediatric): Secondary | ICD-10-CM

## 2018-10-28 DIAGNOSIS — R51 Headache: Secondary | ICD-10-CM

## 2018-10-28 DIAGNOSIS — G4720 Circadian rhythm sleep disorder, unspecified type: Secondary | ICD-10-CM

## 2018-10-28 DIAGNOSIS — T8189XA Other complications of procedures, not elsewhere classified, initial encounter: Secondary | ICD-10-CM

## 2018-10-28 DIAGNOSIS — G4731 Primary central sleep apnea: Secondary | ICD-10-CM

## 2018-10-28 DIAGNOSIS — I5022 Chronic systolic (congestive) heart failure: Secondary | ICD-10-CM

## 2018-10-28 DIAGNOSIS — R9431 Abnormal electrocardiogram [ECG] [EKG]: Secondary | ICD-10-CM

## 2018-10-28 DIAGNOSIS — R351 Nocturia: Secondary | ICD-10-CM

## 2018-10-28 DIAGNOSIS — F1190 Opioid use, unspecified, uncomplicated: Secondary | ICD-10-CM

## 2018-10-31 NOTE — Addendum Note (Signed)
Addended by: Huston Foley on: 10/31/2018 01:47 PM   Modules accepted: Orders

## 2018-10-31 NOTE — Procedures (Signed)
PATIENT'S NAME:  , Grosser DOB:      [birth year]      MR#:    [id number]     DATE OF RECORDING: 10/28/2018 REFERRING M.D.:  Shanna Cisco, MD Study Performed:  Split-Night Titration Study HISTORY: 52 year old man with a history of diabetes, hypertension, CHF, paroxysmal A. fib with status post conversion, and morbid obesity, who reports snoring and excessive daytime somnolence. He was previously diagnosed with severe obstructive sleep apnea and placed on CPAP therapy, likely on 16 cm. The patient endorsed the Epworth Sleepiness Scale at 13/24 points, BMI of 43.1 kg/m2. The patient's neck circumference measured 19 inches.  CURRENT MEDICATIONS: Proventil, Zyloprim, Pacerone, Lotensin, Dilacor, Lasix, Glucophage, Toprol XL, Multivitamin, Percocet, Xarleto, Aldactone, Flomax, Hytrin, Zanaflex, Maxzide.  PROCEDURE:  This is a multichannel digital polysomnogram utilizing the Somnostar 11.2 system.  Electrodes and sensors were applied and monitored per AASM Specifications.   EEG, EOG, Chin and Limb EMG, were sampled at 200 Hz.  ECG, Snore and Nasal Pressure, Thermal Airflow, Respiratory Effort, CPAP Flow and Pressure, Oximetry was sampled at 50 Hz. Digital video and audio were recorded.      BASELINE STUDY WITHOUT CPAP RESULTS:  Lights Out was at 22:33 and Lights On at 05:47 for the night, split start at 00:51, epoch 283. Total recording time (TRT) was 144, with a total sleep time (TST) of 123 minutes. The patient's sleep latency was 21.5 minutes. REM sleep was absent. The sleep efficiency was 85.4 %.    SLEEP ARCHITECTURE: WASO (Wake after sleep onset) was 19 minutes, Stage N1 was 9 minutes, Stage N2 was 90.5 minutes, Stage N3 was 23.5 minutes and Stage R (REM sleep) was 0 minutes.  The percentages were Stage N1 7.3%, Stage N2 73.6%, Stage N3 19.1% and Stage R (REM sleep) 0%. The arousals were noted as: 21 were spontaneous, 0 were associated with PLMs, 71 were associated with respiratory  events.  RESPIRATORY ANALYSIS:  There were a total of 152 respiratory events:  12 obstructive apneas, 5 central apneas and 0 mixed apneas with a total of 17 apneas and an apnea index (AI) of 8.3. There were 135 hypopneas with a hypopnea index of 65.9. The patient also had 0 respiratory event related arousals (RERAs).  Snoring was noted.     The total APNEA/HYPOPNEA INDEX (AHI) was 74.1 /hour and the total RESPIRATORY DISTURBANCE INDEX was 74.1 /hour.  0 events occurred in REM sleep and 275 events in NREM. The REM AHI was 0, /hour versus a non-REM AHI of 74.1 /hour. The patient spent 292.5 minutes sleep time in the supine position 117 minutes in non-supine. The supine AHI was 71.6 /hour versus a non-supine AHI of 75.9 /hour.  OXYGEN SATURATION & C02:  The wake baseline 02 saturation was 90%, with the lowest being 77%. Time spent below 89% saturation equaled 110 minutes.  PERIODIC LIMB MOVEMENTS: The patient had a total of 0 Periodic Limb Movements.  The Periodic Limb Movement (PLM) index was 0 /hour and the PLM Arousal index was 0 /hour.  Audio and video analysis did not show any abnormal or unusual movements, behaviors, phonations or vocalizations. The patient took no bathroom breaks. Moderate snoring was noted. The EKG showed PVCs.   TITRATION STUDY WITH CPAP RESULTS:   The patient was fitted with a medium FFM, as he has been using a FFM at home. CPAP was initiated at 5 cmH20 with heated humidity per AASM split night standards and pressure was advanced to a pressure  of 14 cm, but his AHI was high at 44.7/hour, O2 nadir of 81%. He was therefore, switched to standard BiPAP of 15/11 cmH20 because of hypopneas, apneas and desaturations. Of note, he had primarily central events during the titration portion of the study. At a BIPAP pressure of 20/15 cmH20, his AHI was still elevated at 54.5/hour with a total sleep time of 11 minutes, O2 nadir of 89% and non-supine NREM sleep achieved.   Total recording  time (TRT) was 291 minutes, with a total sleep time (TST) of 286 minutes. The patient's sleep latency was 1 minutes. REM latency was 47.5 minutes.  The sleep efficiency was 98.3 %.    SLEEP ARCHITECTURE: Wake after sleep was 3.5 minutes, Stage N1 1 minutes, Stage N2 141 minutes, Stage N3 72 minutes and Stage R (REM sleep) 72 minutes. The percentages were: Stage N1 .3%, Stage N2 49.3%, Stage N3 25.2% and Stage R (REM sleep) 25.2%. The arousals were noted as: 16 were spontaneous, 0 were associated with PLMs, 12 were associated with respiratory events.  RESPIRATORY ANALYSIS:  There were a total of 210 respiratory events: 0 obstructive apneas, 121 central apneas and 0 mixed apneas with a total of 121 apneas and an apnea index (AI) of 25.4. There were 89 hypopneas with a hypopnea index of 18.7 /hour. The patient also had 0 respiratory event related arousals (RERAs).      The total APNEA/HYPOPNEA INDEX  (AHI) was 44.1 /hour and the total RESPIRATORY DISTURBANCE INDEX was 44.1 /hour.  23 events occurred in REM sleep and 187 events in NREM. The REM AHI was 19.2 /hour versus a non-REM AHI of 52.4 /hour. REM sleep was achieved on a pressure of  cm/h2o (AHI was  .) The patient spent 85% of total sleep time in the supine position. The supine AHI was 41.9 /hour, versus a non-supine AHI of 55.8/hour.  OXYGEN SATURATION & C02:  The wake baseline 02 saturation was 90%, with the lowest being 76%. Time spent below 89% saturation equaled 231 minutes.  PERIODIC LIMB MOVEMENTS: The patient had a total of 0 Periodic Limb Movements. The Periodic Limb Movement (PLM) index was 0 /hour and the PLM Arousal index was 0 /hour.  Post-study, the patient indicated that sleep was the same as usual.  POLYSOMNOGRAPHY IMPRESSION :   1. Obstructive Sleep Apnea (OSA)  2. Insufficient treatment with CPAP 3. Treatment emergent Central Sleep Apnea   4. Dysfunctions associated with sleep stages or arousals from sleep 5. Non-specific  abnormal EKG  RECOMMENDATIONS:  1. This patient has severe obstructive sleep apnea and did not responded well on CPAP therapy, developed severe central sleep apnea, while on CPAP; standard BiPAP therapy was not successful. I will ask the patient to return for a full night titration study to optimize treatment. He may need BiPAP ST or ASV. 2. This study shows sleep fragmentation and abnormal sleep stage percentages; these are nonspecific findings and per se do not signify an intrinsic sleep disorder or a cause for the patient's sleep-related symptoms. Causes include (but are not limited to) the first night effect of the sleep study, circadian rhythm disturbances, medication effect or an underlying mood disorder or medical problem.  3. The patient should be cautioned not to drive, work at heights, or operate dangerous or heavy equipment when tired or sleepy. Review and reiteration of good sleep hygiene measures should be pursued with any patient. 4. Please note that untreated obstructive sleep apnea may carry additional perioperative morbidity. Patients with  significant obstructive sleep apnea (typically, in the moderate to severe degree) should receive, if possible, perioperative PAP (positive airway pressure) therapy and the surgeons and particularly the anesthesiologists should be informed of the diagnosis and the severity of the sleep disordered breathing. If feasible, the patient may be asked to bring his/her CPAP or BiPAP machine for a planned/elective surgery.  5. The study showed occasional PVCs on single lead EKG; clinical correlation is recommended. The patient is followed by cardiology.  6. The patient will be seen in follow-up in the sleep clinic at [facility] for discussion of the test results, symptom and treatment compliance review, further management strategies, etc. The referring provider will be notified of the test results.   I certify that I have reviewed the entire raw data recording prior to  the issuance of this report in accordance with the Standards of Accreditation of the American Academy of Sleep Medicine (AASM)  Huston Foley, MD, PhD Diplomat, American Board of Neurology and Sleep Medicine (Neurology and Sleep Medicine)

## 2018-10-31 NOTE — Progress Notes (Signed)
Patient referred by cardiology, seen by me on 09/25/18, attempted split study on 10/28/18.   Please call and notify the patient that the recent sleep study showed severe obstructive sleep apnea, but treatment with CPAP was not successful and BiPAP not enough or not enough time to optimize treatment settings. I recommend, he return for a full night titration study, as he may need BiPAP or more advanced treatment modality.  Weight loss is critical and minimizing narcotic pain meds very important too.  Please explain to patient. I have placed an order in the chart. Thanks.  Huston Foley, MD, PhD [facility])

## 2018-11-03 ENCOUNTER — Telehealth: Payer: Self-pay

## 2018-11-03 NOTE — Telephone Encounter (Signed)
Patient is returning a call. °

## 2018-11-03 NOTE — Telephone Encounter (Signed)
I called pt to discuss his sleep study results. No answer, left a message asking him to call me back. 

## 2018-11-03 NOTE — Telephone Encounter (Signed)
-----   Message from Huston Foley, MD sent at 10/31/2018  1:47 PM EST ----- Patient referred by cardiology, seen by me on 09/25/18, attempted split study on 10/28/18.   Please call and notify the patient that the recent sleep study showed severe obstructive sleep apnea, but treatment with CPAP was not successful and BiPAP not enough or not enough time to optimize treatment settings. I recommend, he return for a full night titration study, as he may need BiPAP or more advanced treatment modality.  Weight loss is critical and minimizing narcotic pain meds very important too.  Please explain to patient. I have placed an order in the chart. Thanks.  Huston Foley, MD, PhD [facility])

## 2018-11-04 NOTE — Telephone Encounter (Signed)
I called pt. I advised pt that Dr. Frances Furbish reviewed their sleep study results and found that pt has severe osa but treatment with cpap was not successful and bipap was not enough or not enough time to optimize treatment and Dr. Frances Furbish recommends that pt be treated with a bipap/cpap. Dr. Frances Furbish recommends that pt return for a repeat sleep study in order to properly titrate the bipap/cpap and ensure a good mask fit. Pt is agreeable to returning for a titration study. I advised pt that our sleep lab will file with pt's insurance and call pt to schedule the sleep study when we hear back from the pt's insurance regarding coverage of this sleep study. I also advised pt that weight loss is critical and minimizing narcotic pain meds is very important too. Pt verbalized understanding of results. Pt had no questions at this time but was encouraged to call back if questions arise.

## 2018-11-26 DIAGNOSIS — I10 Essential (primary) hypertension: Secondary | ICD-10-CM | POA: Diagnosis not present

## 2018-12-03 DIAGNOSIS — I10 Essential (primary) hypertension: Secondary | ICD-10-CM | POA: Diagnosis not present

## 2018-12-03 DIAGNOSIS — I480 Paroxysmal atrial fibrillation: Secondary | ICD-10-CM | POA: Diagnosis not present

## 2018-12-03 DIAGNOSIS — I428 Other cardiomyopathies: Secondary | ICD-10-CM | POA: Diagnosis not present

## 2018-12-03 DIAGNOSIS — I509 Heart failure, unspecified: Secondary | ICD-10-CM | POA: Diagnosis not present

## 2018-12-10 ENCOUNTER — Ambulatory Visit (INDEPENDENT_AMBULATORY_CARE_PROVIDER_SITE_OTHER): Payer: Medicare HMO | Admitting: Neurology

## 2018-12-10 DIAGNOSIS — G4739 Other sleep apnea: Secondary | ICD-10-CM

## 2018-12-10 DIAGNOSIS — G4731 Primary central sleep apnea: Secondary | ICD-10-CM

## 2018-12-10 DIAGNOSIS — Z9889 Other specified postprocedural states: Secondary | ICD-10-CM

## 2018-12-10 DIAGNOSIS — F1190 Opioid use, unspecified, uncomplicated: Secondary | ICD-10-CM

## 2018-12-10 DIAGNOSIS — Z9289 Personal history of other medical treatment: Secondary | ICD-10-CM

## 2018-12-10 DIAGNOSIS — G4733 Obstructive sleep apnea (adult) (pediatric): Secondary | ICD-10-CM

## 2018-12-10 DIAGNOSIS — I5022 Chronic systolic (congestive) heart failure: Secondary | ICD-10-CM

## 2018-12-10 DIAGNOSIS — I480 Paroxysmal atrial fibrillation: Secondary | ICD-10-CM

## 2018-12-10 DIAGNOSIS — G4720 Circadian rhythm sleep disorder, unspecified type: Secondary | ICD-10-CM

## 2018-12-10 DIAGNOSIS — R51 Headache: Secondary | ICD-10-CM

## 2018-12-10 DIAGNOSIS — R351 Nocturia: Secondary | ICD-10-CM

## 2018-12-10 DIAGNOSIS — R9431 Abnormal electrocardiogram [ECG] [EKG]: Secondary | ICD-10-CM

## 2018-12-10 DIAGNOSIS — R519 Headache, unspecified: Secondary | ICD-10-CM

## 2018-12-10 DIAGNOSIS — Z9989 Dependence on other enabling machines and devices: Secondary | ICD-10-CM

## 2018-12-15 ENCOUNTER — Telehealth: Payer: Self-pay

## 2018-12-15 NOTE — Addendum Note (Signed)
Addended by: Huston Foley on: 12/15/2018 08:45 AM   Modules accepted: Orders

## 2018-12-15 NOTE — Procedures (Signed)
S PATIENT'S NAME:  Juan Snyder, Tywan R. DOB:      10/07/1966      MR#:    [id number]     DATE OF RECORDING: 12/10/2018 REFERRING M.D.:  Truett MainlandManish Patwardhan, MD Study Performed:   CPAP  Titration HISTORY:  53 year old man with a history of diabetes, hypertension, CHF, paroxysmal A. fib with status post conversion, and morbid obesity, who presents for a full night titration study with BiPAP. His split night sleep study from 10/28/18 showed severe OSA and insufficient treatment with CPAP. The patient endorsed the Epworth Sleepiness Scale at 13 points. The patient's weight 284 pounds with a height of 68 (inches), resulting in a BMI of 43.1 kg/m2. The patient's neck circumference measured 19 inches.  CURRENT MEDICATIONS: Proventil, Zyloprim, Pacerone, Lotensin, Dilacor, Lasix, Glucophage, Toprol XL, Multivitamin, Percocet, Xarleto, Aldactone, Flomax, Hytrin, Zanaflex, Maxzide.  PROCEDURE:  This is a multichannel digital polysomnogram utilizing the SomnoStar 11.2 system.  Electrodes and sensors were applied and monitored per AASM Specifications.   EEG, EOG, Chin and Limb EMG, were sampled at 200 Hz.  ECG, Snore and Nasal Pressure, Thermal Airflow, Respiratory Effort, CPAP Flow and Pressure, Oximetry was sampled at 50 Hz. Digital video and audio were recorded.      The patient was an F20 medium FFM. BiPAP was initiated at 10/5 cmH20 with heated humidity per AASM standards and pressure was advanced to 25/17 with variable results. He did have good results on a BiPAP pressure of 18/13 cm H20 with non-supine REM sleep achieved and O2 nadir of 92%.   Lights Out was at 21:42 and Lights On at 04:54. Total recording time (TRT) was 432.5 minutes, with a total sleep time (TST) of 395.5 minutes. The patient's sleep latency was 4.5 minutes. REM latency was 97.5 minutes.  The sleep efficiency was 91.4 %.    SLEEP ARCHITECTURE: WASO (Wake after sleep onset) was 33.5 minutes with mild to moderate sleep fragmentation noted. There  were 23 minutes in Stage N1, 237 minutes Stage N2, 65.5 minutes Stage N3 and 70 minutes in Stage REM.  The percentage of Stage N1 was 5.8%, Stage N2 was 59.9%, which is mildly increased, Stage N3 was 16.6%, which is normal, and Stage R (REM sleep) was 17.7%, which is near-normal.  The arousals were noted as: 18 were spontaneous, 0 were associated with PLMs, 9 were associated with respiratory events.  RESPIRATORY ANALYSIS:  There was a total of 57 respiratory events: 0 obstructive apneas, 13 central apneas and 1 mixed apneas with a total of 14 apneas and an apnea index (AI) of 2.1 /hour. There were 43 hypopneas with a hypopnea index of 6.5/hour. The patient also had 0 respiratory event related arousals (RERAs).      The total APNEA/HYPOPNEA INDEX  (AHI) was 8.6 /hour and the total RESPIRATORY DISTURBANCE INDEX was 8.6 /hour  3 events occurred in REM sleep and 54 events in NREM. The REM AHI was 2.6 /hour versus a non-REM AHI of 10. /hour.  The patient spent 285 minutes of total sleep time in the supine position and 111 minutes in non-supine. The supine AHI was 11.5, versus a non-supine AHI of 1.1.  OXYGEN SATURATION & C02:  The baseline 02 saturation was 98%, with the lowest being 89%. Time spent below 89% saturation equaled 0 minutes.  PERIODIC LIMB MOVEMENTS:  The patient had a total of 0 Periodic Limb Movements. The Periodic Limb Movement (PLM) index was 0 and the PLM Arousal index was 0 /hour.  Audio  and video analysis did not show any abnormal or unusual movements, behaviors, phonations or vocalizations. The patient took 1 bathroom break. The EKG showed occasional PVCs.  Post-study, the patient indicated that sleep was better than usual.   POLYSOMNOGRAPHY IMPRESSION :    1. Obstructive Sleep Apnea (OSA)  2. Insufficient treatment with CPAP 3. Dysfunctions associated with sleep stages or arousals from sleep 4. Non-specific abnormal EKG   RECOMMENDATIONS:   1. This study demonstrates  reasonably good results with BiPAP therapy. His AHI improved on a BiPAP pressure of 18/13 cm via FFM. I will, therefore, start the patient on home BiPAP treatment. The patient should be reminded to be fully compliant with PAP therapy to improve sleep related symptoms and decrease long term cardiovascular risks. The patient should be reminded, that it may take up to 3 months to get fully used to using PAP with all planned sleep. The earlier full compliance is achieved, the better long term compliance tends to be. Please note that untreated obstructive sleep apnea may carry additional perioperative morbidity. Patients with significant obstructive sleep apnea should receive perioperative PAP therapy and the surgeons and particularly the anesthesiologist should be informed of the diagnosis and the severity of the sleep disordered breathing. 2. This study shows sleep fragmentation and abnormal sleep stage percentages; these are nonspecific findings and per se do not signify an intrinsic sleep disorder or a cause for the patient's sleep-related symptoms. Causes include (but are not limited to) the first night effect of the sleep study, circadian rhythm disturbances, medication effect or an underlying mood disorder or medical problem.  3. The patient should be cautioned not to drive, work at heights, or operate dangerous or heavy equipment when tired or sleepy. Review and reiteration of good sleep hygiene measures should be pursued with any patient. 4. The study showed occasional PVCs on single lead EKG; clinical correlation is recommended. The patient is followed by cardiology.  5. The patient will be seen in follow-up in the sleep clinic at [facility] for discussion of the test results, symptom and treatment compliance review, further management strategies, etc. The referring provider will be notified of the test results.   I certify that I have reviewed the entire raw data recording prior to the issuance of this report  in accordance with the Standards of Accreditation of the American Academy of Sleep Medicine (AASM)   Huston Foley, MD, PhD Diplomat, American Board of Neurology and Sleep Medicine (Neurology and Sleep Medicine)

## 2018-12-15 NOTE — Telephone Encounter (Signed)
I called pt. I advised pt that Dr. Frances Furbish reviewed their sleep study results and found that pt did well with bipap during his latest sleep study. Dr. Frances Furbish recommends that pt start a bipap at home. I reviewed PAP compliance expectations with the pt. Pt is agreeable to starting a BiPAP. I advised pt that an order will be sent to a DME, [facility], and [facility] will call the pt within about one week after they file with the pt's insurance. [facility] will show the pt how to use the machine, fit for masks, and troubleshoot the BiPAP if needed. A follow up appt was made for insurance purposes with Dr. Frances Furbish on 03/03/19 at 10:30am. Pt verbalized understanding to arrive 15 minutes early and bring their BiPAP. A letter with all of this information in it will be mailed to the pt as a reminder. I verified with the pt that the address we have on file is correct. Pt verbalized understanding of results. Pt had no questions at this time but was encouraged to call back if questions arise. I have sent the order to [facility] and have received confirmation that they have received the order.

## 2018-12-15 NOTE — Progress Notes (Signed)
Patient referred by cardiology, seen by me on 09/25/18, attempted split study on 10/28/18. Patient had a BiPAP titration study on 12/10/18.  Please call and inform patient that I have entered an order for treatment with positive airway pressure (PAP) treatment for obstructive sleep apnea (OSA). He did well during the latest sleep study with BiPAP. We will, therefore, arrange for a machine for home use through a DME (durable medical equipment) company of His choice; and I will see the patient back in follow-up in about 10 weeks. Please also explain to the patient that I will be looking out for compliance data, which can be downloaded from the machine (stored on an SD card, that is inserted in the machine) or via remote access through a modem, that is built into the machine. At the time of the followup appointment we will discuss sleep study results and how it is going with PAP treatment at home. Please advise patient to bring His machine at the time of the first FU visit, even though this is cumbersome. Bringing the machine for every visit after that will likely not be needed, but often helps for the first visit to troubleshoot if needed. Please re-enforce the importance of compliance with treatment and the need for [place] to monitor compliance data - often an insurance requirement and actually good feedback for the patient as far as how they are doing.  Also remind patient, that any interim PAP machine or mask issues should be first addressed with the DME company, as they can often help better with technical and mask fit issues. Please ask if patient has a preference regarding DME company.  Please also make sure, the patient has a follow-up appointment with me in about 10 weeks from the setup date, thanks. May see one of our nurse practitioners if needed for proper timing of the FU appointment.  Please fax or rout report to the referring provider. Thanks,   Huston Foley, MD, PhD [facility]  [facility])

## 2018-12-15 NOTE — Telephone Encounter (Signed)
-----   Message from Huston Foley, MD sent at 12/15/2018  8:45 AM EST ----- Patient referred by cardiology, seen by me on 09/25/18, attempted split study on 10/28/18. Patient had a BiPAP titration study on 12/10/18.  Please call and inform patient that I have entered an order for treatment with positive airway pressure (PAP) treatment for obstructive sleep apnea (OSA). He did well during the latest sleep study with BiPAP. We will, therefore, arrange for a machine for home use through a DME (durable medical equipment) company of His choice; and I will see the patient back in follow-up in about 10 weeks. Please also explain to the patient that I will be looking out for compliance data, which can be downloaded from the machine (stored on an SD card, that is inserted in the machine) or via remote access through a modem, that is built into the machine. At the time of the followup appointment we will discuss sleep study results and how it is going with PAP treatment at home. Please advise patient to bring His machine at the time of the first FU visit, even though this is cumbersome. Bringing the machine for every visit after that will likely not be needed, but often helps for the first visit to troubleshoot if needed. Please re-enforce the importance of compliance with treatment and the need for [place] to monitor compliance data - often an insurance requirement and actually good feedback for the patient as far as how they are doing.  Also remind patient, that any interim PAP machine or mask issues should be first addressed with the DME company, as they can often help better with technical and mask fit issues. Please ask if patient has a preference regarding DME company.  Please also make sure, the patient has a follow-up appointment with me in about 10 weeks from the setup date, thanks. May see one of our nurse practitioners if needed for proper timing of the FU appointment.  Please fax or rout report to the referring  provider. Thanks,   Huston Foley, MD, PhD [facility])

## 2018-12-16 ENCOUNTER — Telehealth (HOSPITAL_COMMUNITY): Payer: Self-pay | Admitting: *Deleted

## 2018-12-16 DIAGNOSIS — I10 Essential (primary) hypertension: Secondary | ICD-10-CM | POA: Diagnosis not present

## 2018-12-16 NOTE — Telephone Encounter (Signed)
Office referral received for pt to participate in Cardiac Rehab.  Pt is referred to cardiac rehab for Systolic Heart Failure by Dr. Rosemary Holms. Pt echo on 9/22 shows EF 20%,  Reviewed office note for 1/15.  Pt is appropriate to move forward with creating electronic referral, insurance verification/eligibility and scheduling.  Will forward to support staff. Alanson Aly, BSN Cardiac and Emergency planning/management officer

## 2018-12-25 DIAGNOSIS — Z1329 Encounter for screening for other suspected endocrine disorder: Secondary | ICD-10-CM | POA: Diagnosis not present

## 2018-12-25 DIAGNOSIS — E1165 Type 2 diabetes mellitus with hyperglycemia: Secondary | ICD-10-CM | POA: Diagnosis not present

## 2018-12-25 DIAGNOSIS — I509 Heart failure, unspecified: Secondary | ICD-10-CM | POA: Diagnosis not present

## 2018-12-25 DIAGNOSIS — G4733 Obstructive sleep apnea (adult) (pediatric): Secondary | ICD-10-CM | POA: Diagnosis not present

## 2018-12-25 DIAGNOSIS — Z125 Encounter for screening for malignant neoplasm of prostate: Secondary | ICD-10-CM | POA: Diagnosis not present

## 2018-12-25 DIAGNOSIS — H539 Unspecified visual disturbance: Secondary | ICD-10-CM | POA: Diagnosis not present

## 2018-12-25 DIAGNOSIS — Z01118 Encounter for examination of ears and hearing with other abnormal findings: Secondary | ICD-10-CM | POA: Diagnosis not present

## 2018-12-25 DIAGNOSIS — Z136 Encounter for screening for cardiovascular disorders: Secondary | ICD-10-CM | POA: Diagnosis not present

## 2018-12-25 DIAGNOSIS — I10 Essential (primary) hypertension: Secondary | ICD-10-CM | POA: Diagnosis not present

## 2018-12-25 DIAGNOSIS — E559 Vitamin D deficiency, unspecified: Secondary | ICD-10-CM | POA: Diagnosis not present

## 2018-12-25 DIAGNOSIS — I119 Hypertensive heart disease without heart failure: Secondary | ICD-10-CM | POA: Diagnosis not present

## 2018-12-25 DIAGNOSIS — Z0001 Encounter for general adult medical examination with abnormal findings: Secondary | ICD-10-CM | POA: Diagnosis not present

## 2019-01-09 ENCOUNTER — Telehealth (HOSPITAL_COMMUNITY): Payer: Self-pay

## 2019-01-09 NOTE — Telephone Encounter (Signed)
Pt insurance is active and benefits verified through [employer] Co-pay $35.00, DED $0.00/0 met, out of pocket $5,900/$175.66 met, co-insurance 0%. no pre-authorization required. Passport, 01/09/2019 @ 3:56pm, REF# [id number] ° °Will contact patient to see if he is interested in the Cardiac Rehab Program. If interested, patient will need to complete follow up appt. Once completed, patient will be contacted for scheduling upon review by the RN Navigator. °Gloria W. Support Rep II °

## 2019-01-09 NOTE — Telephone Encounter (Signed)
Called patient to see if he is interested in the Cardiac Rehab Program. Left vm for pt to call back. Tempie Donning. Support Rep II

## 2019-01-13 ENCOUNTER — Other Ambulatory Visit: Payer: Self-pay

## 2019-01-13 DIAGNOSIS — I5033 Acute on chronic diastolic (congestive) heart failure: Secondary | ICD-10-CM

## 2019-01-13 MED ORDER — SACUBITRIL-VALSARTAN 49-51 MG PO TABS: 1.0000 | ORAL_TABLET | Freq: Two times a day (BID) | ORAL | Status: DC

## 2019-01-14 ENCOUNTER — Other Ambulatory Visit: Payer: Self-pay

## 2019-01-14 MED ORDER — SACUBITRIL-VALSARTAN 49-51 MG PO TABS: 1.0000 | ORAL_TABLET | Freq: Two times a day (BID) | ORAL | 3 refills | Status: DC

## 2019-01-15 ENCOUNTER — Ambulatory Visit: Payer: Self-pay | Admitting: Cardiology

## 2019-01-15 ENCOUNTER — Telehealth: Payer: Self-pay | Admitting: Cardiology

## 2019-01-15 NOTE — Telephone Encounter (Signed)
I called the patient and confirmed that he is not taking benazepril, is taking Entresto. Will discuss more at next visit.

## 2019-01-20 NOTE — Progress Notes (Signed)
Patient is here for follow up visit.  Subjective:   Juan Snyder, male    DOB: [birth year], 53 y.o.   MRN: [id number]   Chief Complaint  Patient presents with  . Cardiomyopathy  . Follow-up    6 week    HPI  53 year old African-American male with nonischemic cardiomyopathy, 07/2018, paroxysmal atrial fibrillation/flutter status post successful cardioversion 08/13/2018, not a candidate for ablation due severe LAA enlargement, hypertension, type II diabetes mellitus, morbid obesity, obstructive sleep apnea on CPAP.  Given historically normal EF, which was only reduced after new diagnosis of atrial fibrillation/flutter, I suspected this may be arrhythmia induced atrial fibrillation.  However, his LVEF of persisted to be low.  Thus, I switched his benazepril to Entresto 49-51 mg twice daily which in 11/2015.  Also continued his metoprolol succinate 100 mg daily and spironolactone 50 mg daily.  Patient is on amiodarone 200 mg IV every 8 and Xarelto 20 mg daily for paroxysmal atrial fibrillation/flutter. I also referred him cardiac rehab.   Patient is here for 4-week follow-up. He has been doing well and has noticed improvement in his shortness of breath and leg edema. He has noticed that he has nausea/vomiting 30 min after taking medications. He has been using CPAP about 4 hrs/night, but still continues to have headaches.    Past Medical History:  Diagnosis Date  . Back pain, chronic   . CHF (congestive heart failure) ([place])   . Diabetes mellitus   . Hypertension   . Obesity      Past Surgical History:  Procedure Laterality Date  . CARDIOVERSION N/A 02/18/2018   Procedure: CARDIOVERSION;  Surgeon: Nigel Mormon, MD;  Location: [facility];  Service: Cardiovascular;  Laterality: N/A;  . CARDIOVERSION N/A 08/13/2018   Procedure: CARDIOVERSION;  Surgeon: Nigel Mormon, MD;  Location: [facility];  Service: Cardiovascular;  Laterality: N/A;  . RIGHT/LEFT HEART CATH  AND CORONARY ANGIOGRAPHY N/A 08/12/2018   Procedure: RIGHT/LEFT HEART CATH AND CORONARY ANGIOGRAPHY;  Surgeon: Nigel Mormon, MD;  Location: [facility];  Service: Cardiovascular;  Laterality: N/A;     Social History   Socioeconomic History  . Marital status: Divorced    Spouse name: Not on file  . Number of children: 3  . Years of education: Not on file  . Highest education level: Not on file  Occupational History  . Not on file  Social Needs  . Financial resource strain: Not hard at all  . Food insecurity:    Worry: Patient refused    Inability: Patient refused  . Transportation needs:    Medical: No    Non-medical: No  Tobacco Use  . Smoking status: Never Smoker  . Smokeless tobacco: Never Used  Substance and Sexual Activity  . Alcohol use: Yes    Comment: liquor on weekends  . Drug use: No  . Sexual activity: Not on file  Lifestyle  . Physical activity:    Days per week: 0 days    Minutes per session: 0 min  . Stress: Only a little  Relationships  . Social connections:    Talks on phone: Twice a week    Gets together: Twice a week    Attends religious service: 1 to 4 times per year    Active member of club or organization: No    Attends meetings of clubs or organizations: Never    Relationship status: Divorced  . Intimate partner violence:    Fear of current  or ex partner: No    Emotionally abused: No    Physically abused: No    Forced sexual activity: No  Other Topics Concern  . Not on file  Social History Narrative  . Not on file     Current Outpatient Medications on File Prior to Visit  Medication Sig Dispense Refill  . albuterol (PROVENTIL HFA;VENTOLIN HFA) 108 (90 Base) MCG/ACT inhaler Inhale 1 puff into the lungs every 6 (six) hours as needed for wheezing or shortness of breath.    . allopurinol (ZYLOPRIM) 300 MG tablet Take 300 mg by mouth continuous as needed.     Marland Kitchen amiodarone (PACERONE) 200 MG tablet Take 200 mg by mouth 2 (two)  times daily.    . metoprolol succinate (TOPROL-XL) 100 MG 24 hr tablet Take 1 tablet (100 mg total) by mouth daily. Take with or immediately following a meal. 30 tablet 0  . Multiple Vitamins-Minerals (MULTIVITAMIN PO) Take 1 tablet by mouth daily.    Marland Kitchen oxyCODONE-acetaminophen (PERCOCET) 10-325 MG tablet Take 1 tablet by mouth every 4 (four) hours as needed for pain.    . rivaroxaban (XARELTO) 20 MG TABS tablet Take 20 mg by mouth daily.     Marland Kitchen spironolactone (ALDACTONE) 25 MG tablet Take 1 tablet (25 mg total) by mouth daily. (Patient taking differently: Take 50 mg by mouth daily. ) 30 tablet 0  . tamsulosin (FLOMAX) 0.4 MG CAPS capsule Take 0.4 mg by mouth daily.    Marland Kitchen tiZANidine (ZANAFLEX) 4 MG tablet Take 4 mg by mouth every 8 (eight) hours as needed for muscle spasms.     . sildenafil (VIAGRA) 50 MG tablet continuous as needed.     No current facility-administered medications on file prior to visit.     Recent labs:  Labs 12/17/2017: Glucose 102, BUN/Cr 15/1.28. eGFR 63. Na/K 142/4.6  Labs 11/26/2018: Glucose 126.  BUN/creatinine 15/1.3.  EGFR 72.  Sodium 143, potassium 4.7   Cardiovascular studies:  EKG 09/24/2018: Sinus rhythm 80 bpm. First degree AV block. Left atrial enlargement. Incomplete RBBB.  Echocardiogram 09/29/2018: Left ventricle cavity is mildly dilated. Moderate concentric hypertrophy of the left ventricle. Moderate decrease in global wall motion. Doppler evidence of grade III (restrictive) diastolic dysfunction, elevated LAP. Calculated EF 31%. Left atrial cavity is severely dilated. Right atrial cavity is mildly dilated. Moderate (Grade III) mitral regurgitation. Mild tricuspid regurgitation. Mild pulmonary hypertension. Estimated pulmonary artery systolic pressure 22-29 mmHg. IVC is dilated with a respiratory response of <50%. Marginal increase in LVEF compared to hospital echocardiogram on 08/10/2018.  R&LHC 08/12/2018: Left dominant circulation with no  significant CAD Mildly elevated LVEDP (17 mmHg) Moderate WHO Grp II PH (Mean PA 33 mmHg) Mildly decompensated arrhythmia induced nonischemic cardiomyopathy. Continue guideline directed heart failure therapy, and management of Afib.  Review of Systems  Constitution: Negative for decreased appetite, malaise/fatigue, weight gain and weight loss.  HENT: Negative for congestion.   Eyes: Negative for visual disturbance.  Cardiovascular: Negative for chest pain, claudication, dyspnea on exertion, leg swelling, palpitations and syncope.  Respiratory: Negative for shortness of breath.   Endocrine: Negative for cold intolerance.  Hematologic/Lymphatic: Does not bruise/bleed easily.  Skin: Negative for itching and rash.  Musculoskeletal: Negative for myalgias.  Gastrointestinal: Positive for nausea and vomiting. Negative for abdominal pain.  Genitourinary: Negative for dysuria.  Neurological: Positive for headaches. Negative for dizziness and weakness.  Psychiatric/Behavioral: The patient is not nervous/anxious.   All other systems reviewed and are negative.  Objective:    Vitals:   01/21/19 1119  BP: (!) 146/100  Pulse: (!) 59  SpO2: 93%     Weight at last visit on 11/19/2017 was 284 lb.    Physical Exam  Constitutional: He is oriented to person, place, and time. He appears well-developed and well-nourished. No distress.  Morbidly obese  HENT:  Head: Normocephalic and atraumatic.  Eyes: Pupils are equal, round, and reactive to light. Conjunctivae are normal.  Neck: No JVD present.  Cardiovascular: Normal rate, regular rhythm and intact distal pulses.  No murmur heard. Pulmonary/Chest: Effort normal and breath sounds normal. He has no wheezes. He has no rales.  Abdominal: Soft. Bowel sounds are normal. There is no rebound.  Musculoskeletal:        General: No edema (Trace b/l).  Lymphadenopathy:    He has no cervical adenopathy.  Neurological: He is alert and oriented to  person, place, and time. No cranial nerve deficit.  Skin: Skin is warm and dry.  Psychiatric: He has a normal mood and affect.  Nursing note and vitals reviewed.       Assessment & Recommendations:   53 year old African-American male with nonischemic cardiomyopathy, 07/2018, paroxysmal atrial fibrillation/flutter status post successful cardioversion 08/13/2018, not a candidate for ablation due severe LAA enlargement, hypertension, type II diabetes mellitus, morbid obesity, obstructive sleep apnea on CPAP.  Nonscehmic cardiomyopathy: Possible arrhtymia induced from Afib. Increase Entresto to 97-103 mg bid, Continue spironolactone 50 mg daily, metoprolol succinate 100 mg daily. Check BMP in 1 week  Paroxysmal Afib/flutter: CHA2DS2VASc score 3, annual stroke risk 3.6% Continue Xarelto 20 mg daily. Continue metoprolol succiante 100 mg daily, amiodarone 200 mg bid. Maintianing sinus rhythm. Continue management of OSA with CPAP, weight loss.  Hypertension: Better controlled.  Vomiting: I suspect this may be related to metformin. I have asked him to try lower dose 500 mg bid, with food. He must follow up with his PCP regarding his symptoms and diabetes  OSA: Severe. Needs CPAP. Follow up with sleep neurology. His headache may be related to OSA, but he should follow up with PCP/Neurology.   Type II DM: Managed by PCP.  Repeat echocardiogram in 3 months, followed by office visit.   Nigel Mormon, MD [facility] [place] Pager: [phone] Office: [phone] If no answer Cell [phone]

## 2019-01-21 ENCOUNTER — Encounter: Payer: Self-pay | Admitting: Cardiology

## 2019-01-21 ENCOUNTER — Ambulatory Visit: Payer: Medicare HMO | Admitting: Cardiology

## 2019-01-21 VITALS — BP 146/100 | HR 59 | Ht 68.0 in | Wt 285.9 lb

## 2019-01-21 DIAGNOSIS — I4892 Unspecified atrial flutter: Secondary | ICD-10-CM

## 2019-01-21 DIAGNOSIS — I5022 Chronic systolic (congestive) heart failure: Secondary | ICD-10-CM

## 2019-01-21 DIAGNOSIS — I129 Hypertensive chronic kidney disease with stage 1 through stage 4 chronic kidney disease, or unspecified chronic kidney disease: Secondary | ICD-10-CM

## 2019-01-21 DIAGNOSIS — N183 Chronic kidney disease, stage 3 (moderate): Secondary | ICD-10-CM | POA: Diagnosis not present

## 2019-01-21 DIAGNOSIS — I480 Paroxysmal atrial fibrillation: Secondary | ICD-10-CM

## 2019-01-21 DIAGNOSIS — I428 Other cardiomyopathies: Secondary | ICD-10-CM

## 2019-01-21 DIAGNOSIS — E1122 Type 2 diabetes mellitus with diabetic chronic kidney disease: Secondary | ICD-10-CM

## 2019-01-21 DIAGNOSIS — I10 Essential (primary) hypertension: Secondary | ICD-10-CM

## 2019-01-21 DIAGNOSIS — R112 Nausea with vomiting, unspecified: Secondary | ICD-10-CM | POA: Diagnosis not present

## 2019-01-21 MED ORDER — METFORMIN HCL 500 MG PO TABS: 500.0000 mg | ORAL_TABLET | Freq: Two times a day (BID) | ORAL | 0 refills | Status: DC

## 2019-01-21 MED ORDER — SACUBITRIL-VALSARTAN 97-103 MG PO TABS: 1.0000 | ORAL_TABLET | Freq: Two times a day (BID) | ORAL | 3 refills | Status: DC

## 2019-01-21 MED ORDER — FUROSEMIDE 40 MG PO TABS: 40.0000 mg | ORAL_TABLET | ORAL | 0 refills | Status: DC | PRN

## 2019-01-21 NOTE — Patient Instructions (Signed)
Take Entresto 2 tablets of 49-51 mg twice daily. When you finish the bottle, then take Entresto 97-103 mg 1 tablet twice daily. Take lasix only as needed for leg edema. Will check bloodwork in 1 week.

## 2019-01-23 DIAGNOSIS — I509 Heart failure, unspecified: Secondary | ICD-10-CM | POA: Diagnosis not present

## 2019-01-23 DIAGNOSIS — G4733 Obstructive sleep apnea (adult) (pediatric): Secondary | ICD-10-CM | POA: Diagnosis not present

## 2019-01-26 NOTE — Telephone Encounter (Signed)
Called patient to see if he was interested in participating in the Cardiac Rehab Program. Patient stated yes. Patient will come in for orientation on 02/12/2019 @ 830AM  and will attend the 11:15AM exercise class. Went over insurance, patient verbalized understanding.  Mailed homework package.

## 2019-02-02 ENCOUNTER — Telehealth (HOSPITAL_COMMUNITY): Payer: Self-pay

## 2019-02-02 NOTE — Telephone Encounter (Signed)
Called pt and left a vm telling him that the department will be closed for 2 weeks.. canceled pt apts until then.

## 2019-02-12 ENCOUNTER — Ambulatory Visit (HOSPITAL_COMMUNITY): Payer: Medicare HMO

## 2019-02-16 DIAGNOSIS — J302 Other seasonal allergic rhinitis: Secondary | ICD-10-CM | POA: Diagnosis not present

## 2019-02-16 DIAGNOSIS — I119 Hypertensive heart disease without heart failure: Secondary | ICD-10-CM | POA: Diagnosis not present

## 2019-02-16 DIAGNOSIS — I10 Essential (primary) hypertension: Secondary | ICD-10-CM | POA: Diagnosis not present

## 2019-02-16 DIAGNOSIS — Z0001 Encounter for general adult medical examination with abnormal findings: Secondary | ICD-10-CM | POA: Diagnosis not present

## 2019-02-16 DIAGNOSIS — E1165 Type 2 diabetes mellitus with hyperglycemia: Secondary | ICD-10-CM | POA: Diagnosis not present

## 2019-02-16 DIAGNOSIS — E559 Vitamin D deficiency, unspecified: Secondary | ICD-10-CM | POA: Diagnosis not present

## 2019-02-16 DIAGNOSIS — E782 Mixed hyperlipidemia: Secondary | ICD-10-CM | POA: Diagnosis not present

## 2019-02-18 ENCOUNTER — Ambulatory Visit (HOSPITAL_COMMUNITY): Payer: Medicare HMO

## 2019-02-20 ENCOUNTER — Ambulatory Visit (HOSPITAL_COMMUNITY): Payer: Medicare HMO

## 2019-02-23 ENCOUNTER — Ambulatory Visit (HOSPITAL_COMMUNITY): Payer: Medicare HMO

## 2019-02-23 DIAGNOSIS — I509 Heart failure, unspecified: Secondary | ICD-10-CM | POA: Diagnosis not present

## 2019-02-23 DIAGNOSIS — G4733 Obstructive sleep apnea (adult) (pediatric): Secondary | ICD-10-CM | POA: Diagnosis not present

## 2019-02-24 ENCOUNTER — Telehealth: Payer: Self-pay | Admitting: Neurology

## 2019-02-24 NOTE — Telephone Encounter (Signed)
Due to current COVID 19 pandemic, our office is severely reducing in office visits for at least the next 2 weeks, in order to minimize the risk to our patients and healthcare providers.   Called patient to offer him a sooner appointment via virtual visit with Dr. Frances Furbish. Patient agreed and gave consent. I explained to him the steps of setting up the [employer]. Patient verbalized understanding and was told to call back with any questions. I advised patient that he will be receiving an e-mail from our office with further instructions, as well as a phone call from Dr. Teofilo Pod nurse to go over history. Patient agreed.  Pt understands that although there may be some limitations with this type of visit, we will take all precautions to reduce any security or privacy concerns.  Pt understands that this will be treated like an in office visit and we will file with pt's insurance, and there may be a patient responsible charge related to this service.  Pt's email is [email or web address]. Pt understands that the [employer] webex software must be downloaded and operational on the device pt plans to use for the visit.

## 2019-02-25 ENCOUNTER — Ambulatory Visit (HOSPITAL_COMMUNITY): Payer: Medicare HMO

## 2019-02-25 NOTE — Telephone Encounter (Signed)
I called pt. Pt's meds, allergies, and PMH were updated.  Pt reports that his recent weight is 298 lbs and he is 5'8.  Pt reports that his bipap is going well, he sometimes finds it hard to sleep. His mask has left a "spot" on either side of his nose.  Epworth Sleepiness Scale 0= would never doze 1= slight chance of dozing 2= moderate chance of dozing 3= high chance of dozing  Sitting and reading: 0 Watching TV: 0 Sitting inactive in a public place (ex. Theater or meeting): 0 As a passenger in a car for an hour without a break: 0 Lying down to rest in the afternoon: 1 Sitting and talking to someone: 0 Sitting quietly after lunch (no alcohol): 0 In a car, while stopped in traffic: 0 Total: 1

## 2019-02-27 ENCOUNTER — Ambulatory Visit (HOSPITAL_COMMUNITY): Payer: Medicare HMO

## 2019-03-01 ENCOUNTER — Encounter: Payer: Self-pay | Admitting: Neurology

## 2019-03-02 ENCOUNTER — Ambulatory Visit (HOSPITAL_COMMUNITY): Payer: Medicare HMO

## 2019-03-03 ENCOUNTER — Other Ambulatory Visit: Payer: Self-pay | Admitting: Cardiology

## 2019-03-03 ENCOUNTER — Ambulatory Visit: Payer: Self-pay | Admitting: Neurology

## 2019-03-03 MED ORDER — SPIRONOLACTONE 50 MG PO TABS: 50.0000 mg | ORAL_TABLET | Freq: Every day | ORAL | 3 refills | Status: DC

## 2019-03-03 MED ORDER — RIVAROXABAN 20 MG PO TABS: 20.0000 mg | ORAL_TABLET | Freq: Every day | ORAL | 3 refills | Status: DC

## 2019-03-04 ENCOUNTER — Ambulatory Visit (INDEPENDENT_AMBULATORY_CARE_PROVIDER_SITE_OTHER): Payer: Medicare HMO | Admitting: Neurology

## 2019-03-04 ENCOUNTER — Other Ambulatory Visit: Payer: Self-pay

## 2019-03-04 ENCOUNTER — Ambulatory Visit (HOSPITAL_COMMUNITY): Payer: Medicare HMO

## 2019-03-04 ENCOUNTER — Encounter: Payer: Self-pay | Admitting: Neurology

## 2019-03-04 DIAGNOSIS — G4733 Obstructive sleep apnea (adult) (pediatric): Secondary | ICD-10-CM | POA: Diagnosis not present

## 2019-03-04 NOTE — Progress Notes (Signed)
Interim history:  Juan Snyder is a 53 year old right-handed gentleman with an underlying medical history of diabetes, hypertension, CHF, paroxysmal A. fib with status post conversion, and morbid obesity with a BMI of over 40, with whom I am conducting a virtual, video based appointment for follow-up consultation of his obstructive sleep apnea, on BiPAP therapy. The patient is unaccompanied today and joins from home. I first met him on 09/25/18 at the request of his cardiologist, at which time he reported snoring and excessive daytime somnolence, a prior diagnosis of sleep apnea. His machine was broken and he needed re-evaluation. He was advised to proceed with sleep study testing. He had an interim split-night sleep study on 10/28/2018. I went over his test results with him in detail today. Baseline sleep latency was 21.5 minutes, REM sleep was absent, sleep efficiency 85.4%. He had a total AHI of 74.1 per hour, average oxygen saturation was only 90%, nadir was 77% with significant time below 88% saturation of 110 minutes. He was fitted with a full facemask as he is used to using a fullface mask. He was titrated on CPAP up to a pressure of 14 cm but his AHI was high and he was switched to BiPAP therapy. He had central events during the titration portion of the study and on a BiPAP pressure of 20/15 he still had a elevated AHI. He was advised to return for a separate titration study. He had this on 12/10/2018. Sleep efficiency was 91.4%, sleep latency 4.5 minutes and REM latency 97.5 minutes, REM percentage 17.7%. He was started on BiPAP of 10/5 and gradually advanced to a titration pressure of 18/13 with nonsupine REM sleep achieved and O2 nadir of 92%. He was advised to proceed with home therapy with BiPAP.   Today, 03/04/2019: Please also see below for virtual visit documentation.   I reviewed his BiPAP compliance data from 01/31/2019 through 03/01/2019 which is a total of 30 days, during which time he used  his machine 28 days with percent used days greater than 4 hours at 87%, indicating very good compliance with average usage of 5 hours and 27 minutes, residual AHI at goal at 2.5 per hour, leak acceptable with the 95th percentile at 13 L/m on a pressure of 18/13 cm.    The patient's allergies, current medications, family history, past medical history, past social history, past surgical history and problem list were reviewed and updated as appropriate.   Previously:   09/25/2018: (He) reports snoring and excessive daytime somnolence. He was previously diagnosed with obstructive sleep apnea and placed on CPAP therapy. His machine is broken. I reviewed your office note from 07/30/2018, which you kindly included. He has gained weight over time. Sleep study testing was about 6 years ago, he was told he had severe OSA, and recalls, that his pressure was 16 cm. Originally, he was diagnosed in 2008. He had a ResMed S9, but the humidifier broke earlier this year and about 1 or 2 months ago his machine broke altogether. He has not been on treatment since then. He reports daytime somnolence and difficulty sleeping at night. He felt better while on CPAP. He has had cardioversion in late September 2019. He had a recent checkup from the cardiac standpoint. He still has shortness of breath but overall feels better, swelling in his legs is better as well. He has low back pain and uses a cane for ambulation. Overall, he has gained weight with time. He has lost a little bit during his  last hospitalization. His Epworth sleepiness score is 13 out of 24, fatigue score is 51 out of 63. He is divorced, has 3 grown children, his son lives with him. Of note, he is on narcotic pain medication through pain management, takes 3 or 4 pills per day on average. He is also on a muscle relaxer. He is divorced and does not work. He drinks caffeine in the form of tea, about 1 a day on average, typically no sodas, does not drink coffee, is a  nonsmoker and does not currently utilize any alcohol. He has nocturia about twice per average night and rare morning headaches, is not aware of any family history of sleep apnea.  His Past Medical History Is Significant For: Past Medical History:  Diagnosis Date   Back pain, chronic    CHF (congestive heart failure) ([place])    Diabetes mellitus    Hypertension    Obesity     His Past Surgical History Is Significant For: Past Surgical History:  Procedure Laterality Date   CARDIOVERSION N/A 02/18/2018   Procedure: CARDIOVERSION;  Surgeon: Nigel Mormon, MD;  Location: [facility];  Service: Cardiovascular;  Laterality: N/A;   CARDIOVERSION N/A 08/13/2018   Procedure: CARDIOVERSION;  Surgeon: Nigel Mormon, MD;  Location: [facility];  Service: Cardiovascular;  Laterality: N/A;   RIGHT/LEFT HEART CATH AND CORONARY ANGIOGRAPHY N/A 08/12/2018   Procedure: RIGHT/LEFT HEART CATH AND CORONARY ANGIOGRAPHY;  Surgeon: Nigel Mormon, MD;  Location: [facility];  Service: Cardiovascular;  Laterality: N/A;    His Family History Is Significant For: Family History  Problem Relation Age of Onset   Hypertension Mother     His Social History Is Significant For: Social History   Socioeconomic History   Marital status: Divorced    Spouse name: Not on file   Number of children: 3   Years of education: Not on file   Highest education level: Not on file  Occupational History   Not on file  Social Needs   Financial resource strain: Not hard at all   Food insecurity:    Worry: Patient refused    Inability: Patient refused   Transportation needs:    Medical: No    Non-medical: No  Tobacco Use   Smoking status: Never Smoker   Smokeless tobacco: Never Used  Substance and Sexual Activity   Alcohol use: Yes    Comment: liquor on weekends   Drug use: No   Sexual activity: Not on file  Lifestyle   Physical activity:    Days per week: 0 days     Minutes per session: 0 min   Stress: Only a little  Relationships   Social connections:    Talks on phone: Twice a week    Gets together: Twice a week    Attends religious service: 1 to 4 times per year    Active member of club or organization: No    Attends meetings of clubs or organizations: Never    Relationship status: Divorced  Other Topics Concern   Not on file  Social History Narrative   Not on file    His Allergies Are:  No Known Allergies:   His Current Medications Are:  Outpatient Encounter Medications as of 03/04/2019  Medication Sig   albuterol (PROVENTIL HFA;VENTOLIN HFA) 108 (90 Base) MCG/ACT inhaler Inhale 1 puff into the lungs every 6 (six) hours as needed for wheezing or shortness of breath.   allopurinol (ZYLOPRIM) 300 MG tablet Take  300 mg by mouth continuous as needed.    amiodarone (PACERONE) 200 MG tablet Take 200 mg by mouth 2 (two) times daily.   fluticasone (FLONASE) 50 MCG/ACT nasal spray Place 1 spray into both nostrils daily.   furosemide (LASIX) 40 MG tablet Take 1 tablet (40 mg total) by mouth as needed for edema.   metFORMIN (GLUCOPHAGE) 500 MG tablet Take 1 tablet (500 mg total) by mouth 2 (two) times daily with a meal.   metoprolol succinate (TOPROL-XL) 100 MG 24 hr tablet Take 1 tablet (100 mg total) by mouth daily. Take with or immediately following a meal.   Multiple Vitamins-Minerals (MULTIVITAMIN PO) Take 1 tablet by mouth daily.   oxyCODONE-acetaminophen (PERCOCET) 10-325 MG tablet Take 1 tablet by mouth every 4 (four) hours as needed for pain.   rivaroxaban (XARELTO) 20 MG TABS tablet Take 1 tablet (20 mg total) by mouth daily.   sacubitril-valsartan (ENTRESTO) 97-103 MG Take 1 tablet by mouth 2 (two) times daily.   sildenafil (VIAGRA) 50 MG tablet continuous as needed.   spironolactone (ALDACTONE) 50 MG tablet Take 1 tablet (50 mg total) by mouth daily.   tamsulosin (FLOMAX) 0.4 MG CAPS capsule Take 0.4 mg by mouth daily.     tiZANidine (ZANAFLEX) 4 MG tablet Take 4 mg by mouth every 8 (eight) hours as needed for muscle spasms.    No facility-administered encounter medications on file as of 03/04/2019.   :  Review of Systems:  Out of a complete 14 point review of systems, all are reviewed and negative with the exception of these symptoms as listed below:  Virtual Visit via Video Note on 03/04/2019:  I connected with Juan Snyder on 03/04/19 at 11:30 AM EDT by a video enabled telemedicine application and verified that I am speaking with the correct person using two identifiers.   I discussed the limitations of evaluation and management by telemedicine and the availability of in person appointments. The patient expressed understanding and agreed to proceed.  History of Present Illness: He reports doing quite well with his BiPAP machine, does not mind the bilevel pressure. Previous machine was a CPAP. He uses a full facemask and while he has been using a full facemask all along, he does have a mark on the nose which is currently not sore but has been sore in the beginning. He uses a F 20 fullface mask medium. He has some trouble maintaining sleep, this is not a new problem. Sometimes he takes a PLM type medication. He feels that he cannot sleep without his machine. He would like to explore a different style of fullface mask but would like to wait things out until he can actually be fitted with a different mask. His average sleep time is around 5 hours of 5-1/2 hours. He was trying to lose weight and had lost weight but gained it again. His current Epworth sleepiness score is 1 out of 24. He is pleased with how he is doing, he is very motivated to continue with treatment. He is up-to-date with his supplies, has changed the filter.   Observations/Objective: His most recent vital signs for my review in the chart are from 01/21/2019: Blood pressure 146/100, pulse 59, weight 285.9 pounds for BMI of 43.47. On examination, he is  in no acute distress, pleasant and conversant. Face is symmetric, speech clear without dysarthria, hypophonia or voice tremor noted. He does have a slightly hyperpigmented area on the nasal bridge, no active sore. Facial animation is normal, upper body  movements in upper extremity coordination grossly intact.  Assessment and Plan: In summary, Juan Snyder is a very pleasant 53 year old with an underlying medical history of diabetes, hypertension, CHF, paroxysmal A. fib with status post conversion, and morbid obesity with a BMI of over 75, who presents for a virtual, the tube a follow-up appointment for his severe obstructive sleep apnea. He had reevaluation with a split-night sleep study in December 2019, baseline AHI was 74.1 per hour, O2 nadir 77%. He did not have resolution of his sleep disordered breathing with CPAP alone. He return for a BiPAP titration study in January 2020 and did eventually do well with a BiPAP of 18/13. He uses a fullface mask. He has had some pressure points on the nose, it does not actively bother him but he would be open to exploring different style of full face mask. He does not mind waiting until he can be formally fitted for a new mask. He is highly commended for his treatment adherence. He is compliant with treatment and endorses good results. In fact, he feels that he cannot sleep without his machine. He has some ongoing issues with sleep maintenance and sleep initiation, occasionally uses a PLM type medication at night but not nightly. He is advised to continue to work on weight loss and follow-up routinely in sleep clinic in 6 months, he can see one of our nurse practitioners. We went over his test results today and reviewed his compliance data together. I answered all his questions today and he was in agreement.  Follow Up Instructions:  1. Continue using BiPAP regularly with full compliance, patient is commended for treatment adherence. 2. Follow-up 6 mo, with NP  next time.  3. CPAP supply order UTD with DME company. May request a mask change in the near future. 4. Call or email through My Chart for any interim questions or concerns.   I discussed the assessment and treatment plan with the patient. The patient was provided an opportunity to ask questions and all were answered. The patient agreed with the plan and demonstrated an understanding of the instructions.   The patient was advised to call back or seek an in-person evaluation if the symptoms worsen or if the condition fails to improve as anticipated.  I provided 25 minutes of non-face-to-face time during this encounter.   Star Age, MD

## 2019-03-04 NOTE — Patient Instructions (Signed)
Given verbally, during today's virtual video-based encounter, with verbal feedback received.   

## 2019-03-06 ENCOUNTER — Ambulatory Visit (HOSPITAL_COMMUNITY): Payer: Medicare HMO

## 2019-03-09 ENCOUNTER — Ambulatory Visit (HOSPITAL_COMMUNITY): Payer: Medicare HMO

## 2019-03-10 ENCOUNTER — Telehealth (HOSPITAL_COMMUNITY): Payer: Self-pay | Admitting: *Deleted

## 2019-03-10 NOTE — Telephone Encounter (Signed)
Called and left message for pt in regards to continued closure of the department due to Covid-19. Requested call back. Contact information provided. Alanson Aly, BSN Cardiac and Emergency planning/management officer

## 2019-03-11 ENCOUNTER — Ambulatory Visit (HOSPITAL_COMMUNITY): Payer: Medicare HMO

## 2019-03-13 ENCOUNTER — Ambulatory Visit (HOSPITAL_COMMUNITY): Payer: Medicare HMO

## 2019-03-16 ENCOUNTER — Ambulatory Visit (HOSPITAL_COMMUNITY): Payer: Medicare HMO

## 2019-03-18 ENCOUNTER — Ambulatory Visit (HOSPITAL_COMMUNITY): Payer: Medicare HMO

## 2019-03-19 ENCOUNTER — Ambulatory Visit: Payer: Self-pay | Admitting: Neurology

## 2019-03-20 ENCOUNTER — Ambulatory Visit (HOSPITAL_COMMUNITY): Payer: Medicare HMO

## 2019-03-23 ENCOUNTER — Ambulatory Visit (HOSPITAL_COMMUNITY): Payer: Medicare HMO

## 2019-03-25 ENCOUNTER — Ambulatory Visit (HOSPITAL_COMMUNITY): Payer: Medicare HMO

## 2019-03-25 DIAGNOSIS — I509 Heart failure, unspecified: Secondary | ICD-10-CM | POA: Diagnosis not present

## 2019-03-25 DIAGNOSIS — G4733 Obstructive sleep apnea (adult) (pediatric): Secondary | ICD-10-CM | POA: Diagnosis not present

## 2019-03-27 ENCOUNTER — Ambulatory Visit (HOSPITAL_COMMUNITY): Payer: Medicare HMO

## 2019-03-30 ENCOUNTER — Ambulatory Visit (HOSPITAL_COMMUNITY): Payer: Medicare HMO

## 2019-04-01 ENCOUNTER — Ambulatory Visit (HOSPITAL_COMMUNITY): Payer: Medicare HMO

## 2019-04-03 ENCOUNTER — Ambulatory Visit (HOSPITAL_COMMUNITY): Payer: Medicare HMO

## 2019-04-06 ENCOUNTER — Ambulatory Visit (HOSPITAL_COMMUNITY): Payer: Medicare HMO

## 2019-04-08 ENCOUNTER — Ambulatory Visit (HOSPITAL_COMMUNITY): Payer: Medicare HMO

## 2019-04-10 ENCOUNTER — Ambulatory Visit (HOSPITAL_COMMUNITY): Payer: Medicare HMO

## 2019-04-15 ENCOUNTER — Ambulatory Visit (HOSPITAL_COMMUNITY): Payer: Medicare HMO

## 2019-04-17 ENCOUNTER — Ambulatory Visit (HOSPITAL_COMMUNITY): Payer: Medicare HMO

## 2019-04-20 ENCOUNTER — Ambulatory Visit (HOSPITAL_COMMUNITY): Payer: Medicare HMO

## 2019-04-21 ENCOUNTER — Ambulatory Visit (INDEPENDENT_AMBULATORY_CARE_PROVIDER_SITE_OTHER): Payer: Medicare HMO

## 2019-04-21 ENCOUNTER — Other Ambulatory Visit: Payer: Self-pay

## 2019-04-21 DIAGNOSIS — I5022 Chronic systolic (congestive) heart failure: Secondary | ICD-10-CM | POA: Diagnosis not present

## 2019-04-22 ENCOUNTER — Ambulatory Visit (HOSPITAL_COMMUNITY): Payer: Medicare HMO

## 2019-04-23 ENCOUNTER — Telehealth (HOSPITAL_COMMUNITY): Payer: Self-pay

## 2019-04-23 NOTE — Telephone Encounter (Signed)
Phone call made to Pt to provide information about virtual CR. Pt did not answer. Message was left for Pt to return call. 

## 2019-04-24 ENCOUNTER — Ambulatory Visit (HOSPITAL_COMMUNITY): Payer: Medicare HMO

## 2019-04-25 DIAGNOSIS — I509 Heart failure, unspecified: Secondary | ICD-10-CM | POA: Diagnosis not present

## 2019-04-25 DIAGNOSIS — G4733 Obstructive sleep apnea (adult) (pediatric): Secondary | ICD-10-CM | POA: Diagnosis not present

## 2019-04-27 ENCOUNTER — Ambulatory Visit (HOSPITAL_COMMUNITY): Payer: Medicare HMO

## 2019-04-28 ENCOUNTER — Telehealth (HOSPITAL_COMMUNITY): Payer: Self-pay | Admitting: *Deleted

## 2019-04-29 ENCOUNTER — Ambulatory Visit: Payer: Medicare HMO | Admitting: Cardiology

## 2019-04-29 NOTE — Progress Notes (Deleted)
Patient is here for follow up visit.  Subjective:   Juan Snyder, male    DOB: 09/25/1966, 53 y.o.   MRN: [id number]  I connected with the patient on 04/29/19 by a video enabled telemedicine application and verified that I am speaking with the correct person using two identifiers.     I discussed the limitations of evaluation and management by telemedicine and the availability of in person appointments. The patient expressed understanding and agreed to proceed.   This visit type was conducted due to national recommendations for restrictions regarding the COVID-19 Pandemic (e.g. social distancing).  This format is felt to be most appropriate for this patient at this time.  All issues noted in this document were discussed and addressed.  No physical exam was performed (except for noted visual exam findings with Tele health visits).  The patient has consented to conduct a Tele health visit and understands insurance will be billed.   No chief complaint on file.   HPI  53 year old African-American male with nonischemic cardiomyopathy, 07/2018, paroxysmal atrial fibrillation/flutter status post successful cardioversion 08/13/2018, not a candidate for ablation due severe LAA enlargement, hypertension, type II diabetes mellitus, morbid obesity, obstructive sleep apnea on CPAP.  Given historically normal EF, which was only reduced after new diagnosis of atrial fibrillation/flutter, I suspected this may be arrhythmia induced atrial fibrillation.  However, his LVEF of persisted to be low.  Thus, I switched his benazepril to Entresto 49-51 mg twice daily which in 11/2015.  Also continued his metoprolol succinate 100 mg daily and spironolactone 50 mg daily.  Patient is on amiodarone 200 mg IV every 8 and Xarelto 20 mg daily for paroxysmal atrial fibrillation/flutter. I also referred him cardiac rehab.   Patient is here for 4-week follow-up. He has been doing well and has noticed improvement in his  shortness of breath and leg edema. He has noticed that he has nausea/vomiting 30 min after taking medications. He has been using CPAP about 4 hrs/night, but still continues to have headaches.    Past Medical History:  Diagnosis Date  . Back pain, chronic   . CHF (congestive heart failure) ([facility])   . Diabetes mellitus   . Hypertension   . Obesity      Past Surgical History:  Procedure Laterality Date  . CARDIOVERSION N/A 02/18/2018   Procedure: CARDIOVERSION;  Surgeon: Nigel Mormon, MD;  Location: [facility];  Service: Cardiovascular;  Laterality: N/A;  . CARDIOVERSION N/A 08/13/2018   Procedure: CARDIOVERSION;  Surgeon: Nigel Mormon, MD;  Location: [facility];  Service: Cardiovascular;  Laterality: N/A;  . RIGHT/LEFT HEART CATH AND CORONARY ANGIOGRAPHY N/A 08/12/2018   Procedure: RIGHT/LEFT HEART CATH AND CORONARY ANGIOGRAPHY;  Surgeon: Nigel Mormon, MD;  Location: [facility];  Service: Cardiovascular;  Laterality: N/A;     Social History   Socioeconomic History  . Marital status: Divorced    Spouse name: Not on file  . Number of children: 3  . Years of education: Not on file  . Highest education level: Not on file  Occupational History  . Not on file  Social Needs  . Financial resource strain: Not hard at all  . Food insecurity:    Worry: Patient refused    Inability: Patient refused  . Transportation needs:    Medical: No    Non-medical: No  Tobacco Use  . Smoking status: Never Smoker  . Smokeless tobacco: Never Used  Substance and Sexual Activity  .  Alcohol use: Yes    Comment: liquor on weekends  . Drug use: No  . Sexual activity: Not on file  Lifestyle  . Physical activity:    Days per week: 0 days    Minutes per session: 0 min  . Stress: Only a little  Relationships  . Social connections:    Talks on phone: Twice a week    Gets together: Twice a week    Attends religious service: 1 to 4 times per year    Active member of  club or organization: No    Attends meetings of clubs or organizations: Never    Relationship status: Divorced  . Intimate partner violence:    Fear of current or ex partner: No    Emotionally abused: No    Physically abused: No    Forced sexual activity: No  Other Topics Concern  . Not on file  Social History Narrative  . Not on file     Current Outpatient Medications on File Prior to Visit  Medication Sig Dispense Refill  . albuterol (PROVENTIL HFA;VENTOLIN HFA) 108 (90 Base) MCG/ACT inhaler Inhale 1 puff into the lungs every 6 (six) hours as needed for wheezing or shortness of breath.    . allopurinol (ZYLOPRIM) 300 MG tablet Take 300 mg by mouth continuous as needed.     Marland Kitchen amiodarone (PACERONE) 200 MG tablet Take 200 mg by mouth 2 (two) times daily.    . fluticasone (FLONASE) 50 MCG/ACT nasal spray Place 1 spray into both nostrils daily.    . furosemide (LASIX) 40 MG tablet Take 1 tablet (40 mg total) by mouth as needed for edema. 60 tablet 0  . metFORMIN (GLUCOPHAGE) 500 MG tablet Take 1 tablet (500 mg total) by mouth 2 (two) times daily with a meal. 60 tablet 0  . metoprolol succinate (TOPROL-XL) 100 MG 24 hr tablet Take 1 tablet (100 mg total) by mouth daily. Take with or immediately following a meal. 30 tablet 0  . Multiple Vitamins-Minerals (MULTIVITAMIN PO) Take 1 tablet by mouth daily.    Marland Kitchen oxyCODONE-acetaminophen (PERCOCET) 10-325 MG tablet Take 1 tablet by mouth every 4 (four) hours as needed for pain.    . rivaroxaban (XARELTO) 20 MG TABS tablet Take 1 tablet (20 mg total) by mouth daily. 90 tablet 3  . sacubitril-valsartan (ENTRESTO) 97-103 MG Take 1 tablet by mouth 2 (two) times daily. 60 tablet 3  . sildenafil (VIAGRA) 50 MG tablet continuous as needed.    Marland Kitchen spironolactone (ALDACTONE) 50 MG tablet Take 1 tablet (50 mg total) by mouth daily. 90 tablet 3  . tamsulosin (FLOMAX) 0.4 MG CAPS capsule Take 0.4 mg by mouth daily.    Marland Kitchen tiZANidine (ZANAFLEX) 4 MG tablet Take 4  mg by mouth every 8 (eight) hours as needed for muscle spasms.      No current facility-administered medications on file prior to visit.     Recent labs:  Labs 12/17/2017: Glucose 102, BUN/Cr 15/1.28. eGFR 63. Na/K 142/4.6  Labs 11/26/2018: Glucose 126.  BUN/creatinine 15/1.3.  EGFR 72.  Sodium 143, potassium 4.7   Cardiovascular studies:  EKG 09/24/2018: Sinus rhythm 80 bpm. First degree AV block. Left atrial enlargement. Incomplete RBBB.  Echocardiogram 09/29/2018: Left ventricle cavity is mildly dilated. Moderate concentric hypertrophy of the left ventricle. Moderate decrease in global wall motion. Doppler evidence of grade III (restrictive) diastolic dysfunction, elevated LAP. Calculated EF 31%. Left atrial cavity is severely dilated. Right atrial cavity is mildly dilated. Moderate (Grade  III) mitral regurgitation. Mild tricuspid regurgitation. Mild pulmonary hypertension. Estimated pulmonary artery systolic pressure 28-20 mmHg. IVC is dilated with a respiratory response of <50%. Marginal increase in LVEF compared to hospital echocardiogram on 08/10/2018.  R&LHC 08/12/2018: Left dominant circulation with no significant CAD Mildly elevated LVEDP (17 mmHg) Moderate WHO Grp II PH (Mean PA 33 mmHg) Mildly decompensated arrhythmia induced nonischemic cardiomyopathy. Continue guideline directed heart failure therapy, and management of Afib.  Review of Systems  Constitution: Negative for decreased appetite, malaise/fatigue, weight gain and weight loss.  HENT: Negative for congestion.   Eyes: Negative for visual disturbance.  Cardiovascular: Negative for chest pain, claudication, dyspnea on exertion, leg swelling, palpitations and syncope.  Respiratory: Negative for shortness of breath.   Endocrine: Negative for cold intolerance.  Hematologic/Lymphatic: Does not bruise/bleed easily.  Skin: Negative for itching and rash.  Musculoskeletal: Negative for myalgias.   Gastrointestinal: Positive for nausea and vomiting. Negative for abdominal pain.  Genitourinary: Negative for dysuria.  Neurological: Positive for headaches. Negative for dizziness and weakness.  Psychiatric/Behavioral: The patient is not nervous/anxious.   All other systems reviewed and are negative.      Objective:    There were no vitals filed for this visit.   Weight at last visit on 11/19/2017 was 284 lb.    Physical Exam  Constitutional: He is oriented to person, place, and time. He appears well-developed and well-nourished. No distress.  Morbidly obese  HENT:  Head: Normocephalic and atraumatic.  Eyes: Pupils are equal, round, and reactive to light. Conjunctivae are normal.  Neck: No JVD present.  Cardiovascular: Normal rate, regular rhythm and intact distal pulses.  No murmur heard. Pulmonary/Chest: Effort normal and breath sounds normal. He has no wheezes. He has no rales.  Abdominal: Soft. Bowel sounds are normal. There is no rebound.  Musculoskeletal:        General: No edema (Trace b/l).  Lymphadenopathy:    He has no cervical adenopathy.  Neurological: He is alert and oriented to person, place, and time. No cranial nerve deficit.  Skin: Skin is warm and dry.  Psychiatric: He has a normal mood and affect.  Nursing note and vitals reviewed.       Assessment & Recommendations:   53 year old African-American male with nonischemic cardiomyopathy, 07/2018, paroxysmal atrial fibrillation/flutter status post successful cardioversion 08/13/2018, not a candidate for ablation due severe LAA enlargement, hypertension, type II diabetes mellitus, morbid obesity, obstructive sleep apnea on CPAP.  Nonscehmic cardiomyopathy: Possible arrhtymia induced from Afib. Increase Entresto to 97-103 mg bid, Continue spironolactone 50 mg daily, metoprolol succinate 100 mg daily. Check BMP in 1 week  Paroxysmal Afib/flutter: CHA2DS2VASc score 3, annual stroke risk 3.6% Continue  Xarelto 20 mg daily. Continue metoprolol succiante 100 mg daily, amiodarone 200 mg bid. Maintianing sinus rhythm. Continue management of OSA with CPAP, weight loss.  Hypertension: Better controlled.  Vomiting: I suspect this may be related to metformin. I have asked him to try lower dose 500 mg bid, with food. He must follow up with his PCP regarding his symptoms and diabetes  OSA: Severe. Needs CPAP. Follow up with sleep neurology. His headache may be related to OSA, but he should follow up with PCP/Neurology.   Type II DM: Managed by PCP.  Repeat echocardiogram in 3 months, followed by office visit.   Nigel Mormon, MD [facility] Pager: [phone] Office: [phone] If no answer Cell [phone]

## 2019-05-01 ENCOUNTER — Ambulatory Visit (HOSPITAL_COMMUNITY): Payer: Medicare HMO

## 2019-05-04 ENCOUNTER — Ambulatory Visit (HOSPITAL_COMMUNITY): Payer: Medicare HMO

## 2019-05-06 ENCOUNTER — Ambulatory Visit (HOSPITAL_COMMUNITY): Payer: Medicare HMO

## 2019-05-08 ENCOUNTER — Ambulatory Visit (HOSPITAL_COMMUNITY): Payer: Medicare HMO

## 2019-05-11 ENCOUNTER — Ambulatory Visit (HOSPITAL_COMMUNITY): Payer: Medicare HMO

## 2019-05-13 ENCOUNTER — Ambulatory Visit (HOSPITAL_COMMUNITY): Payer: Medicare HMO

## 2019-05-15 ENCOUNTER — Ambulatory Visit (HOSPITAL_COMMUNITY): Payer: Medicare HMO

## 2019-05-18 ENCOUNTER — Ambulatory Visit (HOSPITAL_COMMUNITY): Payer: Medicare HMO

## 2019-05-18 DIAGNOSIS — J302 Other seasonal allergic rhinitis: Secondary | ICD-10-CM | POA: Diagnosis not present

## 2019-05-18 DIAGNOSIS — I119 Hypertensive heart disease without heart failure: Secondary | ICD-10-CM | POA: Diagnosis not present

## 2019-05-18 DIAGNOSIS — E782 Mixed hyperlipidemia: Secondary | ICD-10-CM | POA: Diagnosis not present

## 2019-05-18 DIAGNOSIS — N528 Other male erectile dysfunction: Secondary | ICD-10-CM | POA: Diagnosis not present

## 2019-05-18 DIAGNOSIS — E559 Vitamin D deficiency, unspecified: Secondary | ICD-10-CM | POA: Diagnosis not present

## 2019-05-18 DIAGNOSIS — E1165 Type 2 diabetes mellitus with hyperglycemia: Secondary | ICD-10-CM | POA: Diagnosis not present

## 2019-05-18 DIAGNOSIS — N400 Enlarged prostate without lower urinary tract symptoms: Secondary | ICD-10-CM | POA: Diagnosis not present

## 2019-05-18 DIAGNOSIS — I10 Essential (primary) hypertension: Secondary | ICD-10-CM | POA: Diagnosis not present

## 2019-05-20 ENCOUNTER — Ambulatory Visit (HOSPITAL_COMMUNITY): Payer: Medicare HMO

## 2019-05-25 DIAGNOSIS — I509 Heart failure, unspecified: Secondary | ICD-10-CM | POA: Diagnosis not present

## 2019-05-25 DIAGNOSIS — G4733 Obstructive sleep apnea (adult) (pediatric): Secondary | ICD-10-CM | POA: Diagnosis not present

## 2019-06-10 ENCOUNTER — Ambulatory Visit: Payer: Medicare HMO | Admitting: Cardiology

## 2019-06-11 ENCOUNTER — Ambulatory Visit (INDEPENDENT_AMBULATORY_CARE_PROVIDER_SITE_OTHER): Payer: Medicare HMO | Admitting: Cardiology

## 2019-06-11 ENCOUNTER — Encounter: Payer: Self-pay | Admitting: Cardiology

## 2019-06-11 ENCOUNTER — Other Ambulatory Visit: Payer: Self-pay

## 2019-06-11 VITALS — BP 140/80

## 2019-06-11 DIAGNOSIS — E1122 Type 2 diabetes mellitus with diabetic chronic kidney disease: Secondary | ICD-10-CM | POA: Diagnosis not present

## 2019-06-11 DIAGNOSIS — N183 Chronic kidney disease, stage 3 (moderate): Secondary | ICD-10-CM | POA: Diagnosis not present

## 2019-06-11 DIAGNOSIS — I480 Paroxysmal atrial fibrillation: Secondary | ICD-10-CM

## 2019-06-11 DIAGNOSIS — I129 Hypertensive chronic kidney disease with stage 1 through stage 4 chronic kidney disease, or unspecified chronic kidney disease: Secondary | ICD-10-CM

## 2019-06-11 DIAGNOSIS — I5022 Chronic systolic (congestive) heart failure: Secondary | ICD-10-CM

## 2019-06-11 DIAGNOSIS — I10 Essential (primary) hypertension: Secondary | ICD-10-CM

## 2019-06-11 NOTE — Progress Notes (Signed)
Patient is here for follow up visit.  Subjective:   Juan Snyder, male    DOB: 19-May-1966, 53 y.o.   MRN: [id number]   I connected with the patient on 06/11/2019 by a telephone call and verified that I am speaking with the correct person using two identifiers.     I offered the patient a video enabled application for a virtual visit. Unfortunately, this could not be accomplished due to technical difficulties/lack of video enabled phone/computer. I discussed the limitations of evaluation and management by telemedicine and the availability of in person appointments. The patient expressed understanding and agreed to proceed.   This visit type was conducted due to national recommendations for restrictions regarding the COVID-19 Pandemic (e.g. social distancing).  This format is felt to be most appropriate for this patient at this time.  All issues noted in this document were discussed and addressed.  No physical exam was performed (except for noted visual exam findings with Tele health visits).  The patient has consented to conduct a Tele health visit and understands insurance will be billed.  No chief complaint on file.   HPI  53 year old African-American male with nonischemic cardiomyopathy, 07/2018, paroxysmal atrial fibrillation/flutter status post successful cardioversion 08/13/2018, not a candidate for ablation due severe LAA enlargement, hypertension, type II diabetes mellitus, morbid obesity, obstructive sleep apnea on CPAP.  He has been doing well and denies chest pain, shortness of breath, palpitations, leg edema, orthopnea, PND, TIA/syncope. BP stays around 140/80 for the most part. He has upcoming colonoscopy scheduled on 08/03 for blood in stool.   Reviewed recent echocardiogram in 04/2019 that showed improved LVEF.   Past Medical History:  Diagnosis Date  . Back pain, chronic   . CHF (congestive heart failure) ([facility])   . Diabetes mellitus   . Hypertension   . Obesity       Past Surgical History:  Procedure Laterality Date  . CARDIOVERSION N/A 02/18/2018   Procedure: CARDIOVERSION;  Surgeon: Elder Negus, MD;  Location: [facility];  Service: Cardiovascular;  Laterality: N/A;  . CARDIOVERSION N/A 08/13/2018   Procedure: CARDIOVERSION;  Surgeon: Elder Negus, MD;  Location: [facility];  Service: Cardiovascular;  Laterality: N/A;  . RIGHT/LEFT HEART CATH AND CORONARY ANGIOGRAPHY N/A 08/12/2018   Procedure: RIGHT/LEFT HEART CATH AND CORONARY ANGIOGRAPHY;  Surgeon: Elder Negus, MD;  Location: [facility];  Service: Cardiovascular;  Laterality: N/A;     Social History   Socioeconomic History  . Marital status: Divorced    Spouse name: Not on file  . Number of children: 3  . Years of education: Not on file  . Highest education level: Not on file  Occupational History  . Not on file  Social Needs  . Financial resource strain: Not hard at all  . Food insecurity    Worry: Patient refused    Inability: Patient refused  . Transportation needs    Medical: No    Non-medical: No  Tobacco Use  . Smoking status: Never Smoker  . Smokeless tobacco: Never Used  Substance and Sexual Activity  . Alcohol use: Yes    Comment: liquor on weekends  . Drug use: No  . Sexual activity: Not on file  Lifestyle  . Physical activity    Days per week: 0 days    Minutes per session: 0 min  . Stress: Only a little  Relationships  . Social connections    Talks on phone: Twice a week  Gets together: Twice a week    Attends religious service: 1 to 4 times per year    Active member of club or organization: No    Attends meetings of clubs or organizations: Never    Relationship status: Divorced  . Intimate partner violence    Fear of current or ex partner: No    Emotionally abused: No    Physically abused: No    Forced sexual activity: No  Other Topics Concern  . Not on file  Social History Narrative  . Not on file      Current Outpatient Medications on File Prior to Visit  Medication Sig Dispense Refill  . albuterol (PROVENTIL HFA;VENTOLIN HFA) 108 (90 Base) MCG/ACT inhaler Inhale 1 puff into the lungs every 6 (six) hours as needed for wheezing or shortness of breath.    . allopurinol (ZYLOPRIM) 300 MG tablet Take 300 mg by mouth continuous as needed.     Marland Kitchen amiodarone (PACERONE) 200 MG tablet Take 200 mg by mouth 2 (two) times daily.    . fluticasone (FLONASE) 50 MCG/ACT nasal spray Place 1 spray into both nostrils daily.    . furosemide (LASIX) 40 MG tablet Take 1 tablet (40 mg total) by mouth as needed for edema. 60 tablet 0  . metFORMIN (GLUCOPHAGE) 500 MG tablet Take 1 tablet (500 mg total) by mouth 2 (two) times daily with a meal. 60 tablet 0  . metoprolol succinate (TOPROL-XL) 100 MG 24 hr tablet Take 1 tablet (100 mg total) by mouth daily. Take with or immediately following a meal. 30 tablet 0  . Multiple Vitamins-Minerals (MULTIVITAMIN PO) Take 1 tablet by mouth daily.    Marland Kitchen oxyCODONE-acetaminophen (PERCOCET) 10-325 MG tablet Take 1 tablet by mouth every 4 (four) hours as needed for pain.    . rivaroxaban (XARELTO) 20 MG TABS tablet Take 1 tablet (20 mg total) by mouth daily. 90 tablet 3  . sacubitril-valsartan (ENTRESTO) 97-103 MG Take 1 tablet by mouth 2 (two) times daily. 60 tablet 3  . sildenafil (VIAGRA) 50 MG tablet continuous as needed.    Marland Kitchen spironolactone (ALDACTONE) 50 MG tablet Take 1 tablet (50 mg total) by mouth daily. 90 tablet 3  . tamsulosin (FLOMAX) 0.4 MG CAPS capsule Take 0.4 mg by mouth daily.    Marland Kitchen tiZANidine (ZANAFLEX) 4 MG tablet Take 4 mg by mouth every 8 (eight) hours as needed for muscle spasms.      No current facility-administered medications on file prior to visit.     Recent labs:  Results for Juan Snyder (MRN [id number]) as of 06/11/2019 09:48  Ref. Range 08/14/2018 59:56  BASIC METABOLIC PANEL Unknown Rpt (A)  Sodium Latest Ref Range: 135 - 145 mmol/L 137   Potassium Latest Ref Range: 3.5 - 5.1 mmol/L 4.5  Chloride Latest Ref Range: 98 - 111 mmol/L 106  CO2 Latest Ref Range: 22 - 32 mmol/L 26  Glucose Latest Ref Range: 70 - 99 mg/dL 204 (H)  BUN Latest Ref Range: 6 - 20 mg/dL 20  Creatinine Latest Ref Range: 0.61 - 1.24 mg/dL 1.08  Calcium Latest Ref Range: 8.9 - 10.3 mg/dL 9.1  Anion gap Latest Ref Range: 5 - 15  5  GFR, Est Non African American Latest Ref Range: >60 mL/min >60  GFR, Est African American Latest Ref Range: >60 mL/min >60   Results for JSHON, IBE (MRN [id number]) as of 06/11/2019 09:48  Ref. Range 08/13/2018 02:45  WBC Latest Ref Range: 4.0 -  10.5 K/uL 7.2  RBC Latest Ref Range: 4.22 - 5.81 MIL/uL 3.94 (L)  Hemoglobin Latest Ref Range: 13.0 - 17.0 g/dL 40.910.6 (L)  HCT Latest Ref Range: 39.0 - 52.0 % 32.7 (L)  MCV Latest Ref Range: 78.0 - 100.0 fL 83.0  MCH Latest Ref Range: 26.0 - 34.0 pg 26.9  MCHC Latest Ref Range: 30.0 - 36.0 g/dL 81.132.4  RDW Latest Ref Range: 11.5 - 15.5 % 13.2  Platelets Latest Ref Range: 150 - 400 K/uL 257    Cardiovascular studies:  Echocardiogram 04/21/2019 :  Left ventricle cavity is mildly dilated. Moderate concentric hypertrophy of the left ventricle. Hypokinetic global wall motion. Moderately depressed LV systolic function with EF 40%. Doppler evidence of grade III (restrictive) diastolic dysfunction, elevated LAP.  Left atrial cavity is severely dilated at 5.2 cm. Right atrial cavity is mildly dilated. Mild mitral valve leaflet thickening. Mild prolapse of the mitral valve leaflets. Moderate (Grade III) posteriorly directed mitral regurgitation. Mild tricuspid regurgitation. Moderate pulmonary hypertension. Estimated pulmonary artery systolic pressure is 51mm Hg with RA pressure estimated at 15 mm Hg. RVSP measures 51 mmHg. IVC is dilated with blunted respiratory response. compared to the study done on 08/10/2018, EF has improved from 20%.  EKG 09/24/2018: Sinus rhythm 80 bpm. First  degree AV block. Left atrial enlargement. Incomplete RBBB.  R&LHC 08/12/2018: Left dominant circulation with no significant CAD Mildly elevated LVEDP (17 mmHg) Moderate WHO Grp II PH (Mean PA 33 mmHg) Mildly decompensated arrhythmia induced nonischemic cardiomyopathy. Continue guideline directed heart failure therapy, and management of Afib.  Review of Systems  Constitution: Negative for decreased appetite, malaise/fatigue, weight gain and weight loss.  HENT: Negative for congestion.   Eyes: Negative for visual disturbance.  Cardiovascular: Negative for chest pain, claudication, dyspnea on exertion, leg swelling, palpitations and syncope.  Respiratory: Negative for shortness of breath.   Endocrine: Negative for cold intolerance.  Hematologic/Lymphatic: Does not bruise/bleed easily.  Skin: Negative for itching and rash.  Musculoskeletal: Negative for myalgias.  Gastrointestinal: Negative for abdominal pain, nausea and vomiting.  Genitourinary: Negative for dysuria.  Neurological: Negative for dizziness, headaches and weakness.  Psychiatric/Behavioral: The patient is not nervous/anxious.   All other systems reviewed and are negative.      Objective:    Vitals:   06/11/19 0945  BP: 140/80       Physical Exam  Not performed. Telephone visit.      Assessment & Recommendations:   53 year old African-American male with nonischemic cardiomyopathy, 07/2018, paroxysmal atrial fibrillation/flutter status post successful cardioversion 08/13/2018, not a candidate for ablation due severe LAA enlargement, hypertension, type II diabetes mellitus, morbid obesity, obstructive sleep apnea on CPAP.  Nonscehmic cardiomyopathy: EF improved to 40% on guideline directed medical therapy.  Continue Entresto to 97-103 mg bid, spironolactone 50 mg daily, metoprolol succinate 100 mg daily. Will obtain recent bloodwork performed through PCP office.   Paroxysmal Afib/flutter: CHA2DS2VASc score 3,  annual stroke risk 3.6% Continue Xarelto 20 mg daily. Continue metoprolol succiante 100 mg daily, amiodarone 200 mg bid. Maintianing sinus rhythm. Continue management of OSA with CPAP, weight loss. Given upcoming colonoscopy, recommend holding Xarelto 2 days before the procedure, and resume as soon as possible after the procedure.   Hypertension: Better controlled.  OSA: Severe, on CPAP.   Type II DM: Managed by PCP.  Office f/u in 6 months.   Elder NegusManish J Jacquita Mulhearn, MD [facility] Pager: [phone] Office: [phone] If no answer Cell [phone]

## 2019-06-22 DIAGNOSIS — Z0100 Encounter for examination of eyes and vision without abnormal findings: Secondary | ICD-10-CM | POA: Diagnosis not present

## 2019-06-22 DIAGNOSIS — Z135 Encounter for screening for eye and ear disorders: Secondary | ICD-10-CM | POA: Diagnosis not present

## 2019-06-22 DIAGNOSIS — H5210 Myopia, unspecified eye: Secondary | ICD-10-CM | POA: Diagnosis not present

## 2019-06-25 DIAGNOSIS — G4733 Obstructive sleep apnea (adult) (pediatric): Secondary | ICD-10-CM | POA: Diagnosis not present

## 2019-06-25 DIAGNOSIS — I509 Heart failure, unspecified: Secondary | ICD-10-CM | POA: Diagnosis not present

## 2019-07-06 DIAGNOSIS — K5790 Diverticulosis of intestine, part unspecified, without perforation or abscess without bleeding: Secondary | ICD-10-CM | POA: Diagnosis not present

## 2019-07-06 DIAGNOSIS — K625 Hemorrhage of anus and rectum: Secondary | ICD-10-CM | POA: Diagnosis not present

## 2019-07-06 DIAGNOSIS — Z7901 Long term (current) use of anticoagulants: Secondary | ICD-10-CM | POA: Diagnosis not present

## 2019-07-20 DIAGNOSIS — G4733 Obstructive sleep apnea (adult) (pediatric): Secondary | ICD-10-CM | POA: Diagnosis not present

## 2019-07-26 DIAGNOSIS — I509 Heart failure, unspecified: Secondary | ICD-10-CM | POA: Diagnosis not present

## 2019-07-26 DIAGNOSIS — G4733 Obstructive sleep apnea (adult) (pediatric): Secondary | ICD-10-CM | POA: Diagnosis not present

## 2019-08-19 DIAGNOSIS — G4733 Obstructive sleep apnea (adult) (pediatric): Secondary | ICD-10-CM | POA: Diagnosis not present

## 2019-08-20 DIAGNOSIS — I119 Hypertensive heart disease without heart failure: Secondary | ICD-10-CM | POA: Diagnosis not present

## 2019-08-20 DIAGNOSIS — J302 Other seasonal allergic rhinitis: Secondary | ICD-10-CM | POA: Diagnosis not present

## 2019-08-20 DIAGNOSIS — E1165 Type 2 diabetes mellitus with hyperglycemia: Secondary | ICD-10-CM | POA: Diagnosis not present

## 2019-08-20 DIAGNOSIS — E559 Vitamin D deficiency, unspecified: Secondary | ICD-10-CM | POA: Diagnosis not present

## 2019-08-20 DIAGNOSIS — I10 Essential (primary) hypertension: Secondary | ICD-10-CM | POA: Diagnosis not present

## 2019-08-20 DIAGNOSIS — N400 Enlarged prostate without lower urinary tract symptoms: Secondary | ICD-10-CM | POA: Diagnosis not present

## 2019-08-20 DIAGNOSIS — E782 Mixed hyperlipidemia: Secondary | ICD-10-CM | POA: Diagnosis not present

## 2019-08-20 DIAGNOSIS — Z2821 Immunization not carried out because of patient refusal: Secondary | ICD-10-CM | POA: Diagnosis not present

## 2019-08-20 DIAGNOSIS — N528 Other male erectile dysfunction: Secondary | ICD-10-CM | POA: Diagnosis not present

## 2019-08-25 DIAGNOSIS — G4733 Obstructive sleep apnea (adult) (pediatric): Secondary | ICD-10-CM | POA: Diagnosis not present

## 2019-08-25 DIAGNOSIS — I509 Heart failure, unspecified: Secondary | ICD-10-CM | POA: Diagnosis not present

## 2019-09-02 DIAGNOSIS — I10 Essential (primary) hypertension: Secondary | ICD-10-CM | POA: Diagnosis not present

## 2019-09-02 DIAGNOSIS — E669 Obesity, unspecified: Secondary | ICD-10-CM | POA: Diagnosis not present

## 2019-09-02 DIAGNOSIS — R3911 Hesitancy of micturition: Secondary | ICD-10-CM | POA: Diagnosis not present

## 2019-09-02 DIAGNOSIS — E119 Type 2 diabetes mellitus without complications: Secondary | ICD-10-CM | POA: Diagnosis not present

## 2019-09-02 DIAGNOSIS — I509 Heart failure, unspecified: Secondary | ICD-10-CM | POA: Diagnosis not present

## 2019-09-02 DIAGNOSIS — E785 Hyperlipidemia, unspecified: Secondary | ICD-10-CM | POA: Diagnosis not present

## 2019-09-19 DIAGNOSIS — G4733 Obstructive sleep apnea (adult) (pediatric): Secondary | ICD-10-CM | POA: Diagnosis not present

## 2019-09-23 DIAGNOSIS — N401 Enlarged prostate with lower urinary tract symptoms: Secondary | ICD-10-CM | POA: Diagnosis not present

## 2019-09-23 DIAGNOSIS — R3912 Poor urinary stream: Secondary | ICD-10-CM | POA: Diagnosis not present

## 2019-09-23 DIAGNOSIS — N5201 Erectile dysfunction due to arterial insufficiency: Secondary | ICD-10-CM | POA: Diagnosis not present

## 2019-09-25 DIAGNOSIS — G4733 Obstructive sleep apnea (adult) (pediatric): Secondary | ICD-10-CM | POA: Diagnosis not present

## 2019-09-25 DIAGNOSIS — I509 Heart failure, unspecified: Secondary | ICD-10-CM | POA: Diagnosis not present

## 2019-12-01 ENCOUNTER — Other Ambulatory Visit: Payer: Self-pay | Admitting: Cardiology

## 2019-12-18 ENCOUNTER — Ambulatory Visit: Payer: Medicare HMO | Admitting: Cardiology

## 2019-12-20 ENCOUNTER — Other Ambulatory Visit: Payer: Self-pay | Admitting: Cardiology

## 2019-12-20 DIAGNOSIS — E1122 Type 2 diabetes mellitus with diabetic chronic kidney disease: Secondary | ICD-10-CM

## 2020-01-06 DIAGNOSIS — N62 Hypertrophy of breast: Secondary | ICD-10-CM | POA: Diagnosis not present

## 2020-01-13 ENCOUNTER — Other Ambulatory Visit: Payer: Self-pay | Admitting: Cardiology

## 2020-01-13 DIAGNOSIS — E1122 Type 2 diabetes mellitus with diabetic chronic kidney disease: Secondary | ICD-10-CM

## 2020-01-21 DIAGNOSIS — K625 Hemorrhage of anus and rectum: Secondary | ICD-10-CM | POA: Diagnosis not present

## 2020-01-25 ENCOUNTER — Other Ambulatory Visit: Payer: Self-pay | Admitting: Gastroenterology

## 2020-01-27 ENCOUNTER — Other Ambulatory Visit: Payer: Self-pay

## 2020-01-27 ENCOUNTER — Ambulatory Visit: Payer: Medicare HMO | Admitting: Cardiology

## 2020-01-27 ENCOUNTER — Encounter: Payer: Self-pay | Admitting: Cardiology

## 2020-01-27 VITALS — BP 136/79 | HR 55 | Temp 96.8°F | Ht 68.0 in | Wt 280.0 lb

## 2020-01-27 DIAGNOSIS — I480 Paroxysmal atrial fibrillation: Secondary | ICD-10-CM | POA: Diagnosis not present

## 2020-01-27 DIAGNOSIS — I10 Essential (primary) hypertension: Secondary | ICD-10-CM

## 2020-01-27 DIAGNOSIS — I5022 Chronic systolic (congestive) heart failure: Secondary | ICD-10-CM | POA: Diagnosis not present

## 2020-01-27 NOTE — Progress Notes (Signed)
Patient is here for follow up visit.  Subjective:   Juan Snyder, male    DOB: [birth year], 54 y.o.   MRN: [id number]    Chief Complaint  Patient presents with  . Congestive Heart Failure  . Follow-up    6 month    HPI  54 year old African-American male with nonischemic cardiomyopathy, 07/2018, paroxysmal atrial fibrillation/flutter status post successful cardioversion 08/13/2018, not a candidate for ablation due severe LAA enlargement, hypertension, type II diabetes mellitus, morbid obesity, obstructive sleep apnea on CPAP.  He has been doing well and denies chest pain, shortness of breath, palpitations, leg edema, orthopnea, PND, TIA/syncope. He has upcoming colonoscopy for hematochezia.   Today, he complains of bilateral breast tenderness.  He is currently on spironolactone 50 mg daily.   Current Outpatient Medications on File Prior to Visit  Medication Sig Dispense Refill  . albuterol (PROVENTIL HFA;VENTOLIN HFA) 108 (90 Base) MCG/ACT inhaler Inhale 1 puff into the lungs every 6 (six) hours as needed for wheezing or shortness of breath.    . allopurinol (ZYLOPRIM) 300 MG tablet Take 300 mg by mouth continuous as needed.     Marland Kitchen amiodarone (PACERONE) 200 MG tablet Take 200 mg by mouth 2 (two) times daily.    Marland Kitchen ENTRESTO 97-103 MG TAKE 1 TABLET BY MOUTH TWICE A DAY 60 tablet 3  . fluticasone (FLONASE) 50 MCG/ACT nasal spray Place 1 spray into both nostrils daily.    . furosemide (LASIX) 40 MG tablet Take 1 tablet (40 mg total) by mouth as needed for edema. 60 tablet 0  . metFORMIN (GLUCOPHAGE) 500 MG tablet TAKE 1 TABLET BY MOUTH 2 TIMES DAILY WITH A MEAL. 180 tablet 1  . metoprolol succinate (TOPROL-XL) 100 MG 24 hr tablet Take 1 tablet (100 mg total) by mouth daily. Take with or immediately following a meal. 30 tablet 0  . Multiple Vitamins-Minerals (MULTIVITAMIN PO) Take 1 tablet by mouth daily.    Marland Kitchen oxyCODONE-acetaminophen (PERCOCET) 10-325 MG tablet Take 1 tablet by  mouth every 4 (four) hours as needed for pain.    . rivaroxaban (XARELTO) 20 MG TABS tablet Take 1 tablet (20 mg total) by mouth daily. 90 tablet 3  . sildenafil (VIAGRA) 50 MG tablet continuous as needed.    Marland Kitchen spironolactone (ALDACTONE) 50 MG tablet Take 1 tablet (50 mg total) by mouth daily. 90 tablet 3  . tamsulosin (FLOMAX) 0.4 MG CAPS capsule Take 0.4 mg by mouth daily.    Marland Kitchen tiZANidine (ZANAFLEX) 4 MG tablet Take 4 mg by mouth every 8 (eight) hours as needed for muscle spasms.      No current facility-administered medications on file prior to visit.    Recent labs: Checked by PCP recently.  Not available to me today.  Will obtain results.  08/14/2028: Glucose 204, BUN/Cr 20/1.08. EGFR >60. Na/K 137/4.5.  H/H 10.6/32.7. MCV 83. Platelets 257 HbA1C 7.5% TSH 1.2 normal  Cardiovascular studies:  EKG 01/27/2020: Sinus rhythm 53 bpm.  First degree AV block. Nonspecific T-abnormality.   Echocardiogram 04/21/2019 :  Left ventricle cavity is mildly dilated. Moderate concentric hypertrophy of the left ventricle. Hypokinetic global wall motion. Moderately depressed LV systolic function with EF 40%. Doppler evidence of grade III (restrictive) diastolic dysfunction, elevated LAP.  Left atrial cavity is severely dilated at 5.2 cm. Right atrial cavity is mildly dilated. Mild mitral valve leaflet thickening. Mild prolapse of the mitral valve leaflets. Moderate (Grade III) posteriorly directed mitral regurgitation. Mild tricuspid regurgitation. Moderate pulmonary hypertension.  Estimated pulmonary artery systolic pressure is 03FV Hg with RA pressure estimated at 15 mm Hg. RVSP measures 51 mmHg. IVC is dilated with blunted respiratory response. compared to the study done on 08/10/2018, EF has improved from 20%.  EKG 09/24/2018: Sinus rhythm 80 bpm. First degree AV block. Left atrial enlargement. Incomplete RBBB.  R&LHC 08/12/2018: Left dominant circulation with no significant CAD Mildly  elevated LVEDP (17 mmHg) Moderate WHO Grp II PH (Mean PA 33 mmHg) Mildly decompensated arrhythmia induced nonischemic cardiomyopathy. Continue guideline directed heart failure therapy, and management of Afib.  Review of Systems  Constitution:       Bilateral breast tenderness  Cardiovascular: Negative for chest pain, dyspnea on exertion, leg swelling, palpitations and syncope.  Gastrointestinal: Positive for melena.       Objective:    Vitals:   01/27/20 1030  BP: 136/79  Pulse: (!) 55  Temp: (!) 96.8 F (36 C)  SpO2: 98%       Physical Exam  Constitutional: He appears well-developed and well-nourished.  Neck: No JVD present.  Cardiovascular: Normal rate, regular rhythm, normal heart sounds and intact distal pulses.  No murmur heard. Pulmonary/Chest: Effort normal and breath sounds normal. He has no wheezes. He has no rales.  Musculoskeletal:        General: No edema.  Nursing note and vitals reviewed.      Assessment & Recommendations:   54 year old African-American male with nonischemic cardiomyopathy, 07/2018, paroxysmal atrial fibrillation/flutter status post successful cardioversion 08/13/2018, not a candidate for ablation due severe LAA enlargement, hypertension, type II diabetes mellitus, morbid obesity, obstructive sleep apnea on CPAP.  Nonscehmic cardiomyopathy: EF improved to 40% on guideline directed medical therapy. (Echo 04/2019) Continue Entresto to 97-103 mg bid,  metoprolol succinate 100 mg daily. Stopped spironolactone 50 mg daily due to bilateral breast tenderness. Will obtain recent bloodwork performed through PCP office.   Paroxysmal Afib/flutter: CHA2DS2VASc score 3, annual stroke risk 3.6% Continue Xarelto 20 mg daily. Continue metoprolol succiante 100 mg daily, amiodarone 200 mg bid. Maintianing sinus rhythm. Continue management of OSA with CPAP, weight loss. Given upcoming colonoscopy, recommend holding Xarelto 2 days before the procedure,  and resume as soon as possible after the procedure.   Hypertension: Controlled.  OSA: Severe, on CPAP.  Will refer to weight loss clinic.  Type II DM: Managed by PCP.  Office f/u in 3 months.   Nigel Mormon, MD [facility] [place] Pager: [phone] Office: [phone] If no answer Cell [phone]

## 2020-02-17 DIAGNOSIS — I10 Essential (primary) hypertension: Secondary | ICD-10-CM | POA: Diagnosis not present

## 2020-02-17 DIAGNOSIS — E1165 Type 2 diabetes mellitus with hyperglycemia: Secondary | ICD-10-CM | POA: Diagnosis not present

## 2020-02-17 DIAGNOSIS — N400 Enlarged prostate without lower urinary tract symptoms: Secondary | ICD-10-CM | POA: Diagnosis not present

## 2020-02-17 DIAGNOSIS — R062 Wheezing: Secondary | ICD-10-CM | POA: Diagnosis not present

## 2020-02-17 DIAGNOSIS — I119 Hypertensive heart disease without heart failure: Secondary | ICD-10-CM | POA: Diagnosis not present

## 2020-02-17 DIAGNOSIS — E559 Vitamin D deficiency, unspecified: Secondary | ICD-10-CM | POA: Diagnosis not present

## 2020-02-17 DIAGNOSIS — J302 Other seasonal allergic rhinitis: Secondary | ICD-10-CM | POA: Diagnosis not present

## 2020-02-17 DIAGNOSIS — N528 Other male erectile dysfunction: Secondary | ICD-10-CM | POA: Diagnosis not present

## 2020-02-17 DIAGNOSIS — Z0001 Encounter for general adult medical examination with abnormal findings: Secondary | ICD-10-CM | POA: Diagnosis not present

## 2020-02-17 DIAGNOSIS — E782 Mixed hyperlipidemia: Secondary | ICD-10-CM | POA: Diagnosis not present

## 2020-02-24 DIAGNOSIS — G4733 Obstructive sleep apnea (adult) (pediatric): Secondary | ICD-10-CM | POA: Diagnosis not present

## 2020-02-26 ENCOUNTER — Encounter (HOSPITAL_COMMUNITY): Payer: Self-pay | Admitting: Gastroenterology

## 2020-03-04 DIAGNOSIS — T2120XA Burn of second degree of trunk, unspecified site, initial encounter: Secondary | ICD-10-CM | POA: Diagnosis not present

## 2020-03-05 ENCOUNTER — Other Ambulatory Visit (HOSPITAL_COMMUNITY): Admission: RE | Admit: 2020-03-05 | Payer: Medicare HMO | Source: Ambulatory Visit

## 2020-03-09 ENCOUNTER — Ambulatory Visit (HOSPITAL_COMMUNITY): Admission: RE | Admit: 2020-03-09 | Payer: Medicare HMO | Source: Home / Self Care | Admitting: Gastroenterology

## 2020-03-09 HISTORY — DX: Sleep apnea, unspecified: G47.30

## 2020-03-09 HISTORY — DX: Dyspnea, unspecified: R06.00

## 2020-03-09 SURGERY — COLONOSCOPY WITH PROPOFOL
Anesthesia: Monitor Anesthesia Care

## 2020-03-25 DIAGNOSIS — G4733 Obstructive sleep apnea (adult) (pediatric): Secondary | ICD-10-CM | POA: Diagnosis not present

## 2020-04-25 DIAGNOSIS — G4733 Obstructive sleep apnea (adult) (pediatric): Secondary | ICD-10-CM | POA: Diagnosis not present

## 2020-04-28 ENCOUNTER — Other Ambulatory Visit: Payer: Self-pay

## 2020-04-28 ENCOUNTER — Encounter: Payer: Self-pay | Admitting: Cardiology

## 2020-04-28 ENCOUNTER — Ambulatory Visit: Payer: Medicare HMO | Admitting: Cardiology

## 2020-04-28 ENCOUNTER — Other Ambulatory Visit: Payer: Self-pay | Admitting: Cardiology

## 2020-04-28 VITALS — BP 146/87 | HR 69 | Ht 68.0 in | Wt 306.0 lb

## 2020-04-28 DIAGNOSIS — I5023 Acute on chronic systolic (congestive) heart failure: Secondary | ICD-10-CM | POA: Diagnosis not present

## 2020-04-28 DIAGNOSIS — I4891 Unspecified atrial fibrillation: Secondary | ICD-10-CM | POA: Diagnosis not present

## 2020-04-28 DIAGNOSIS — E119 Type 2 diabetes mellitus without complications: Secondary | ICD-10-CM | POA: Diagnosis not present

## 2020-04-28 DIAGNOSIS — I10 Essential (primary) hypertension: Secondary | ICD-10-CM

## 2020-04-28 DIAGNOSIS — I480 Paroxysmal atrial fibrillation: Secondary | ICD-10-CM | POA: Diagnosis not present

## 2020-04-28 DIAGNOSIS — I509 Heart failure, unspecified: Secondary | ICD-10-CM | POA: Diagnosis not present
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
## 2020-04-29 LAB — TSH: TSH: 1.83 u[IU]/mL (ref 0.450–4.500)

## 2020-04-29 LAB — COMPREHENSIVE METABOLIC PANEL
ALT: 11 IU/L (ref 0–44)
AST: 13 IU/L (ref 0–40)
Albumin/Globulin Ratio: 1.8 (ref 1.2–2.2)
Albumin: 4.5 g/dL (ref 3.8–4.9)
Alkaline Phosphatase: 45 IU/L — ABNORMAL LOW (ref 48–121)
BUN/Creatinine Ratio: 13 (ref 9–20)
BUN: 15 mg/dL (ref 6–24)
Bilirubin Total: 0.8 mg/dL (ref 0.0–1.2)
CO2: 22 mmol/L (ref 20–29)
Calcium: 9.9 mg/dL (ref 8.7–10.2)
Chloride: 104 mmol/L (ref 96–106)
Creatinine, Ser: 1.19 mg/dL (ref 0.76–1.27)
GFR calc Af Amer: 79 mL/min/{1.73_m2} (ref >59)
GFR calc non Af Amer: 68 mL/min/{1.73_m2} (ref >59)
Globulin, Total: 2.5 g/dL (ref 1.5–4.5)
Glucose: 124 mg/dL — ABNORMAL HIGH (ref 65–99)
Potassium: 4.4 mmol/L (ref 3.5–5.2)
Sodium: 140 mmol/L (ref 134–144)
Total Protein: 7 g/dL (ref 6.0–8.5)

## 2020-04-29 LAB — PRO B NATRIURETIC PEPTIDE: NT-Pro BNP: 294 pg/mL — ABNORMAL HIGH (ref 0–210)

## 2020-05-05 ENCOUNTER — Other Ambulatory Visit: Payer: Self-pay

## 2020-05-05 ENCOUNTER — Ambulatory Visit: Payer: Medicare HMO

## 2020-05-05 DIAGNOSIS — I5023 Acute on chronic systolic (congestive) heart failure: Secondary | ICD-10-CM

## 2020-05-18 DIAGNOSIS — J302 Other seasonal allergic rhinitis: Secondary | ICD-10-CM | POA: Diagnosis not present

## 2020-05-18 DIAGNOSIS — I10 Essential (primary) hypertension: Secondary | ICD-10-CM | POA: Diagnosis not present

## 2020-05-18 DIAGNOSIS — N528 Other male erectile dysfunction: Secondary | ICD-10-CM | POA: Diagnosis not present

## 2020-05-18 DIAGNOSIS — E1165 Type 2 diabetes mellitus with hyperglycemia: Secondary | ICD-10-CM | POA: Diagnosis not present

## 2020-05-18 DIAGNOSIS — E782 Mixed hyperlipidemia: Secondary | ICD-10-CM | POA: Diagnosis not present

## 2020-05-18 DIAGNOSIS — E559 Vitamin D deficiency, unspecified: Secondary | ICD-10-CM | POA: Diagnosis not present

## 2020-05-18 DIAGNOSIS — I119 Hypertensive heart disease without heart failure: Secondary | ICD-10-CM | POA: Diagnosis not present

## 2020-05-18 DIAGNOSIS — N400 Enlarged prostate without lower urinary tract symptoms: Secondary | ICD-10-CM | POA: Diagnosis not present

## 2020-05-26 ENCOUNTER — Ambulatory Visit: Payer: Medicare HMO | Admitting: Cardiology

## 2020-05-26 NOTE — Progress Notes (Signed)
No show

## 2020-05-27 DIAGNOSIS — Z91199 Patient's noncompliance with other medical treatment and regimen due to unspecified reason: Secondary | ICD-10-CM | POA: Insufficient documentation

## 2020-06-07 ENCOUNTER — Encounter (HOSPITAL_COMMUNITY): Payer: Self-pay | Admitting: Emergency Medicine

## 2020-06-07 ENCOUNTER — Emergency Department (HOSPITAL_COMMUNITY): Payer: Medicare HMO

## 2020-06-07 ENCOUNTER — Inpatient Hospital Stay (HOSPITAL_COMMUNITY)
Admission: EM | Admit: 2020-06-07 | Discharge: 2020-06-10 | DRG: 291 | Disposition: A | Discharge status: Home / Self Care | Payer: Medicare HMO | Attending: Cardiology | Admitting: Cardiology

## 2020-06-07 ENCOUNTER — Other Ambulatory Visit: Payer: Self-pay

## 2020-06-07 DIAGNOSIS — I110 Hypertensive heart disease with heart failure: Principal | ICD-10-CM | POA: Diagnosis present

## 2020-06-07 DIAGNOSIS — Z7984 Long term (current) use of oral hypoglycemic drugs: Secondary | ICD-10-CM

## 2020-06-07 DIAGNOSIS — Z20822 Contact with and (suspected) exposure to covid-19: Secondary | ICD-10-CM | POA: Diagnosis present

## 2020-06-07 DIAGNOSIS — Z7901 Long term (current) use of anticoagulants: Secondary | ICD-10-CM

## 2020-06-07 DIAGNOSIS — J9601 Acute respiratory failure with hypoxia: Secondary | ICD-10-CM | POA: Diagnosis present

## 2020-06-07 DIAGNOSIS — I5043 Acute on chronic combined systolic (congestive) and diastolic (congestive) heart failure: Secondary | ICD-10-CM

## 2020-06-07 DIAGNOSIS — Z8249 Family history of ischemic heart disease and other diseases of the circulatory system: Secondary | ICD-10-CM

## 2020-06-07 DIAGNOSIS — R0602 Shortness of breath: Secondary | ICD-10-CM | POA: Diagnosis not present

## 2020-06-07 DIAGNOSIS — E119 Type 2 diabetes mellitus without complications: Secondary | ICD-10-CM | POA: Diagnosis present

## 2020-06-07 DIAGNOSIS — E876 Hypokalemia: Secondary | ICD-10-CM | POA: Diagnosis present

## 2020-06-07 DIAGNOSIS — I509 Heart failure, unspecified: Secondary | ICD-10-CM | POA: Diagnosis not present

## 2020-06-07 DIAGNOSIS — I517 Cardiomegaly: Secondary | ICD-10-CM | POA: Diagnosis not present

## 2020-06-07 DIAGNOSIS — G4733 Obstructive sleep apnea (adult) (pediatric): Secondary | ICD-10-CM | POA: Diagnosis present

## 2020-06-07 DIAGNOSIS — G894 Chronic pain syndrome: Secondary | ICD-10-CM | POA: Diagnosis present

## 2020-06-07 DIAGNOSIS — I428 Other cardiomyopathies: Secondary | ICD-10-CM | POA: Diagnosis present

## 2020-06-07 DIAGNOSIS — R918 Other nonspecific abnormal finding of lung field: Secondary | ICD-10-CM | POA: Diagnosis not present

## 2020-06-07 DIAGNOSIS — I5021 Acute systolic (congestive) heart failure: Secondary | ICD-10-CM | POA: Diagnosis present

## 2020-06-07 DIAGNOSIS — I5023 Acute on chronic systolic (congestive) heart failure: Secondary | ICD-10-CM | POA: Diagnosis present

## 2020-06-07 DIAGNOSIS — Z79899 Other long term (current) drug therapy: Secondary | ICD-10-CM

## 2020-06-07 DIAGNOSIS — I480 Paroxysmal atrial fibrillation: Secondary | ICD-10-CM | POA: Diagnosis present

## 2020-06-07 DIAGNOSIS — Z6841 Body Mass Index (BMI) 40.0 and over, adult: Secondary | ICD-10-CM

## 2020-06-07 DIAGNOSIS — I5022 Chronic systolic (congestive) heart failure: Secondary | ICD-10-CM

## 2020-06-07 DIAGNOSIS — J811 Chronic pulmonary edema: Secondary | ICD-10-CM | POA: Diagnosis not present

## 2020-06-07 DIAGNOSIS — N179 Acute kidney failure, unspecified: Secondary | ICD-10-CM | POA: Diagnosis present

## 2020-06-07 DIAGNOSIS — M549 Dorsalgia, unspecified: Secondary | ICD-10-CM | POA: Diagnosis present

## 2020-06-07 LAB — BASIC METABOLIC PANEL
Anion gap: 12 (ref 5–15)
BUN: 15 mg/dL (ref 6–20)
CO2: 22 mmol/L (ref 22–32)
Calcium: 9.2 mg/dL (ref 8.9–10.3)
Chloride: 103 mmol/L (ref 98–111)
Creatinine, Ser: 1.28 mg/dL — ABNORMAL HIGH (ref 0.61–1.24)
GFR calc Af Amer: >60 mL/min (ref >60)
GFR calc non Af Amer: >60 mL/min (ref >60)
Glucose, Bld: 165 mg/dL — ABNORMAL HIGH (ref 70–99)
Potassium: 3.7 mmol/L (ref 3.5–5.1)
Sodium: 137 mmol/L (ref 135–145)

## 2020-06-07 LAB — URINALYSIS, ROUTINE W REFLEX MICROSCOPIC
Bacteria, UA: NONE SEEN
Bilirubin Urine: NEGATIVE
Glucose, UA: NEGATIVE mg/dL
Hgb urine dipstick: NEGATIVE
Ketones, ur: NEGATIVE mg/dL
Leukocytes,Ua: NEGATIVE
Nitrite: NEGATIVE
Protein, ur: 100 mg/dL — AB
Specific Gravity, Urine: 1.019 (ref 1.005–1.030)
pH: 5 (ref 5.0–8.0)

## 2020-06-07 LAB — CBC
HCT: 34.6 % — ABNORMAL LOW (ref 39.0–52.0)
Hemoglobin: 11 g/dL — ABNORMAL LOW (ref 13.0–17.0)
MCH: 27.3 pg (ref 26.0–34.0)
MCHC: 31.8 g/dL (ref 30.0–36.0)
MCV: 85.9 fL (ref 80.0–100.0)
Platelets: 139 10*3/uL — ABNORMAL LOW (ref 150–400)
RBC: 4.03 MIL/uL — ABNORMAL LOW (ref 4.22–5.81)
RDW: 14.3 % (ref 11.5–15.5)
WBC: 9 10*3/uL (ref 4.0–10.5)
nRBC: 0 % (ref 0.0–0.2)

## 2020-06-07 LAB — TROPONIN I (HIGH SENSITIVITY)
Troponin I (High Sensitivity): 16 ng/L (ref <18)
Troponin I (High Sensitivity): 32 ng/L — ABNORMAL HIGH (ref <18)

## 2020-06-07 MED ORDER — SODIUM CHLORIDE 0.9% FLUSH: 3.0000 mL | Freq: Once | INTRAVENOUS | Status: DC

## 2020-06-07 MED ORDER — FUROSEMIDE 10 MG/ML IJ SOLN
40.0000 mg | Freq: Once | INTRAMUSCULAR | Status: AC
  Administered 2020-06-07: 40 mg via INTRAVENOUS
  Filled 2020-06-07: qty 4

## 2020-06-07 NOTE — ED Triage Notes (Signed)
Pt reports increasing SOB over the last week. Reports some cough. Denies recent fever or sick contacts. Report +fall with LOC last week. PT awake, alert, appropriate. VSS at present.

## 2020-06-07 NOTE — ED Provider Notes (Signed)
[facility] EMERGENCY DEPARTMENT Provider Note   CSN: [id number] Arrival date & time: 06/07/20  1224     History Chief Complaint  Patient presents with  . Shortness of Breath    MAITLAND Snyder is a 54 y.o. male with a h/o chronic systolic and diastolic congestive heart failure, diabetes mellitus, HTN, paroxysmal atrial fibrillation on Eliquis, OSA on CPAP, and chronic back pain who presents to the emergency department with a chief complaint of shortness of breath.  The patient reports constant shortness of breath for the last week.  He reports that symptoms initially significantly worsened 5 days ago, but his breathing is continued to progressively decline since.  Reports that he initially walk approximately 20 yards and up a flight of stairs without getting winded, but now can barely walk 10 yards or more than 2 rooms without having to stop and take a break.  He reports that his home inhaler has been out in his car for the last few days, but he has been too afraid to walk from inside of his home to outside of his car to go collect it to see if it would help with his breathing until earlier today because he was afraid that he would get so short of breath that he would pass out.  He reports associated orthopnea, PND (typially 3 pillows + wedge, now 4-5), nonproductive cough, abdominal distention, leg swelling, and sinus pressure and congestion.  He reports that he has chest tightness, but only with exertion.  No chest pain or tightness at rest.  No fever, chills, dizziness, numbness, weakness, rash, visual changes, myalgias, dysuria, nausea, vomiting, or diarrhea.  He notes that his abdomen was very distended and hard earlier this week.  He was concerned that he might be constipated so he took magnesium citrate.  Reports that abdominal distention improved somewhat following treatment.  He has been compliant with his home 40 mg of Lasix daily.  He was told by his cardiologist  that he could increase his Lasix to 40 mg twice daily if needed.  He reports that he increased his dose once a few days ago, but has otherwise only been taking his 40 mg daily for the last few days.  He has been compliant with his home Eliquis.  He reports that he has been feeling increasingly thirsty and has been drinking plenty of fluids, but his significant other has noted that his urine has been decreased, more concentrated, and darker in color and that he has been urinating less frequently for around a week.  He was seen by primary care elevated today and was noted to have oxygen saturation of 87%.  Reports that at his last visit that he was at 304 pounds 1 month ago, but weighed 312 pounds today.  His PCP advised him to follow-up with the ER.   He has been vaccinated for COVID-19.  The history is provided by the patient. No language interpreter was used.       Past Medical History:  Diagnosis Date  . Back pain, chronic   . CHF (congestive heart failure) (HCC)   . Diabetes mellitus   . Dyspnea   . Hypertension   . Obesity   . Sleep apnea    uses CPAP    Patient Active Problem List   Diagnosis Date Noted  . Acute on chronic congestive heart failure (HCC) 06/08/2020  . No-show for appointment 05/27/2020  . Acute on chronic systolic (congestive) heart failure (HCC) 01/27/2020  .  Paroxysmal atrial flutter (HCC) 01/21/2019  . Acute on chronic diastolic CHF (congestive heart failure) (HCC) 08/10/2018  . Paroxysmal A-fib (HCC) 08/10/2018  . Diabetes (HCC) 07/22/2015  . Essential hypertension, benign 07/22/2015  . Chronic pain syndrome 07/22/2015    Past Surgical History:  Procedure Laterality Date  . CARDIOVERSION N/A 02/18/2018   Procedure: CARDIOVERSION;  Surgeon: Elder Negus, MD;  Location: [facility] ENDOSCOPY;  Service: Cardiovascular;  Laterality: N/A;  . CARDIOVERSION N/A 08/13/2018   Procedure: CARDIOVERSION;  Surgeon: Elder Negus, MD;  Location: [facility] ENDOSCOPY;   Service: Cardiovascular;  Laterality: N/A;  . RIGHT/LEFT HEART CATH AND CORONARY ANGIOGRAPHY N/A 08/12/2018   Procedure: RIGHT/LEFT HEART CATH AND CORONARY ANGIOGRAPHY;  Surgeon: Elder Negus, MD;  Location: [facility] INVASIVE CV LAB;  Service: Cardiovascular;  Laterality: N/A;       Family History  Problem Relation Age of Onset  . Hypertension Mother   . Hypertension Brother     Social History   Tobacco Use  . Smoking status: Never Smoker  . Smokeless tobacco: Never Used  Vaping Use  . Vaping Use: Never used  Substance Use Topics  . Alcohol use: Yes    Comment: liquor on weekends  . Drug use: No    Home Medications Prior to Admission medications   Medication Sig Start Date End Date Taking? Authorizing Provider  albuterol (PROVENTIL HFA;VENTOLIN HFA) 108 (90 Base) MCG/ACT inhaler Inhale 1 puff into the lungs every 6 (six) hours as needed for wheezing or shortness of breath.   Yes [provider]  allopurinol (ZYLOPRIM) 300 MG tablet Take 300 mg by mouth continuous as needed.    Yes [provider]  amiodarone (PACERONE) 200 MG tablet Take 200 mg by mouth 2 (two) times daily.   Yes [provider]  diphenhydrAMINE (BENADRYL) 25 MG tablet Take 25 mg by mouth daily as needed for itching or allergies.   Yes [provider]  ENTRESTO 97-103 MG TAKE 1 TABLET BY MOUTH TWICE A DAY Patient taking differently: Take 1 tablet by mouth 2 (two) times daily.  12/01/19  Yes Patwardhan, Manish J, MD  fluticasone (FLONASE) 50 MCG/ACT nasal spray Place 1 spray into both nostrils daily as needed for allergies.    Yes [provider]  furosemide (LASIX) 40 MG tablet Take 1 tablet (40 mg total) by mouth as needed for edema. 01/21/19  Yes Patwardhan, Manish J, MD  loratadine (CLARITIN) 10 MG tablet Take 10 mg by mouth daily as needed for allergies.   Yes [provider]  metFORMIN (GLUCOPHAGE) 500 MG tablet TAKE 1 TABLET BY MOUTH 2 TIMES DAILY WITH A  MEAL. Patient taking differently: Take 500 mg by mouth 2 (two) times daily with a meal.  01/14/20  Yes Patwardhan, Manish J, MD  methocarbamol (ROBAXIN) 750 MG tablet Take 750 mg by mouth every 8 (eight) hours as needed for muscle spasms.   Yes [provider]  metoprolol succinate (TOPROL-XL) 100 MG 24 hr tablet Take 1 tablet (100 mg total) by mouth daily. Take with or immediately following a meal. 08/15/18  Yes Randel Pigg, Dorma Russell, MD  Multiple Vitamins-Minerals (MULTIVITAMIN PO) Take 1 tablet by mouth daily.   Yes [provider]  naproxen sodium (ALEVE) 220 MG tablet Take 440 mg by mouth daily as needed (pain).   Yes [provider]  oxyCODONE-acetaminophen (PERCOCET) 10-325 MG tablet Take 1 tablet by mouth every 4 (four) hours as needed for pain.   Yes [provider]  rivaroxaban (XARELTO) 20 MG TABS tablet Take 1 tablet (20 mg total) by mouth daily. 03/03/19  Yes Patwardhan, Manish J, MD  tadalafil (CIALIS) 20 MG tablet Take 20 mg by mouth daily as needed for erectile dysfunction.  10/30/19  Yes [provider]  tamsulosin (FLOMAX) 0.4 MG CAPS capsule Take 0.4 mg by mouth 2 (two) times daily.    Yes [provider]    Allergies    Patient has no known allergies.  Review of Systems   Review of Systems  Constitutional: Negative for appetite change, chills and fever.  HENT: Positive for sinus pressure and sinus pain. Negative for congestion and sore throat.   Eyes: Negative for visual disturbance.  Respiratory: Positive for cough, chest tightness and shortness of breath. Negative for wheezing.   Cardiovascular: Positive for leg swelling. Negative for chest pain.  Gastrointestinal: Positive for abdominal distention. Negative for abdominal pain, nausea and vomiting.  Endocrine: Positive for polydipsia.  Genitourinary: Positive for decreased urine volume and frequency. Negative for dysuria and hematuria.  Musculoskeletal: Negative for back  pain, gait problem, joint swelling, myalgias, neck pain and neck stiffness.  Skin: Negative for rash.  Allergic/Immunologic: Negative for immunocompromised state.  Neurological: Positive for light-headedness. Negative for dizziness, seizures, syncope, weakness and headaches.  Psychiatric/Behavioral: Negative for confusion.    Physical Exam Updated Vital Signs BP (!) 130/107   Pulse (!) 56   Temp 99.5 F (37.5 C) (Oral)   Resp (!) 23   Ht  (1.727 m)   Wt (!) 141.5 kg   SpO2 94%   BMI 47.44 kg/m   Physical Exam Vitals and nursing note reviewed.  Constitutional:      General: He is not in acute distress.    Appearance: He is well-developed. He is obese. He is not ill-appearing, toxic-appearing or diaphoretic.     Comments: Nontoxic-appearing.  No acute distress.  HENT:     Head: Normocephalic.     Mouth/Throat:     Mouth: Mucous membranes are dry.  Eyes:     Conjunctiva/sclera: Conjunctivae normal.  Cardiovascular:     Rate and Rhythm: Normal rate and regular rhythm.     Pulses: Normal pulses.     Heart sounds: No murmur heard.  No friction rub. No gallop.   Pulmonary:     Effort: Pulmonary effort is normal.     Comments: Crackles in the bilateral bases Abdominal:     General: There is distension.     Palpations: Abdomen is soft.     Tenderness: There is abdominal tenderness. There is no right CVA tenderness, left CVA tenderness, guarding or rebound.     Hernia: No hernia is present.     Comments: Abdomen is obese, distended, and firm, but not hard.  No tenderness to palpation.  Normoactive bowel sounds.  Musculoskeletal:     Cervical back: Neck supple.     Comments: 3+ pretibial pitting edema bilaterally  Skin:    General: Skin is warm and dry.     Capillary Refill: Capillary refill takes less than 2 seconds.  Neurological:     Mental Status: He is alert.  Psychiatric:        Behavior: Behavior normal.     ED Results / Procedures / Treatments    Labs (all labs ordered are listed, but only abnormal results are displayed) Labs Reviewed  BASIC METABOLIC PANEL - Abnormal; Notable for the following components:      Result Value   Glucose, Bld  165 (*)    Creatinine, Ser 1.28 (*)    All other components within normal limits  CBC - Abnormal; Notable for the following components:   RBC 4.03 (*)    Hemoglobin 11.0 (*)    HCT 34.6 (*)    Platelets 139 (*)    All other components within normal limits  BRAIN NATRIURETIC PEPTIDE - Abnormal; Notable for the following components:   B Natriuretic Peptide 831.9 (*)    All other components within normal limits  URINALYSIS, ROUTINE W REFLEX MICROSCOPIC - Abnormal; Notable for the following components:   Color, Urine AMBER (*)    Protein, ur 100 (*)    All other components within normal limits  MAGNESIUM - Abnormal; Notable for the following components:   Magnesium 1.0 (*)    All other components within normal limits  BASIC METABOLIC PANEL - Abnormal; Notable for the following components:   Potassium 2.1 (*)    Chloride 122 (*)    CO2 14 (*)    Calcium 5.3 (*)    All other components within normal limits  TROPONIN I (HIGH SENSITIVITY) - Abnormal; Notable for the following components:   Troponin I (High Sensitivity) 32 (*)    All other components within normal limits  TROPONIN I (HIGH SENSITIVITY) - Abnormal; Notable for the following components:   Troponin I (High Sensitivity) 27 (*)    All other components within normal limits  SARS CORONAVIRUS 2 BY RT PCR (HOSPITAL ORDER, PERFORMED IN [facility])  BRAIN NATRIURETIC PEPTIDE  TROPONIN I (HIGH SENSITIVITY)    EKG EKG Interpretation  Date/Time:  Tuesday June 07 2020 12:45:54 EDT Ventricular Rate:  71 PR Interval:  216 QRS Duration: 110 QT Interval:  420 QTC Calculation: 456 R Axis:   49 Text Interpretation: Sinus rhythm with 1st degree A-V block Nonspecific T wave abnormality Abnormal ECG sinus replacing alfutter  on prior 9/19 Confirmed by Meridee Score [phone]) on 06/07/2020 10:26:37 PM   Radiology DG Chest 2 View  Result Date: 06/07/2020 CLINICAL DATA:  Cough, shortness of breath EXAM: CHEST - 2 VIEW COMPARISON:  08/10/2018 FINDINGS: Stable cardiomegaly. Pulmonary vascular congestion with diffuse bilateral interstitial opacities. Patchy alveolar opacities within the mid to lower lung zones bilaterally. No pleural effusion or pneumothorax. IMPRESSION: Findings of CHF with pulmonary edema. Patchy opacities within the mid to lower lung zones bilaterally likely reflect alveolar edema. Electronically Signed   By: Duanne Guess D.O.   On: 06/07/2020 13:20    Procedures .Critical Care Performed by: Barkley Boards, PA-C Authorized by: Barkley Boards, PA-C   Critical care provider statement:    Critical care time (minutes):  45   Critical care time was exclusive of:  Separately billable procedures and treating other patients and teaching time   Critical care was necessary to treat or prevent imminent or life-threatening deterioration of the following conditions:  Respiratory failure and cardiac failure   Critical care was time spent personally by me on the following activities:  Ordering and performing treatments and interventions, ordering and review of laboratory studies, ordering and review of radiographic studies, discussions with consultants, development of treatment plan with patient or surrogate, re-evaluation of patient's condition, review of old charts, obtaining history from patient or surrogate, examination of patient and evaluation of patient's response to treatment   I assumed direction of critical care for this patient from another provider in my specialty: no     (including critical care time)  Medications Ordered in  ED Medications  sodium chloride flush (NS) 0.9 % injection 3 mL (has no administration in time range)  albuterol (VENTOLIN HFA) 108 (90 Base) MCG/ACT inhaler 1 puff (has no  administration in time range)  rivaroxaban (XARELTO) tablet 20 mg (20 mg Oral Given 06/08/20 0252)  tamsulosin (FLOMAX) capsule 0.4 mg (0.4 mg Oral Given 06/08/20 0251)  metoprolol succinate (TOPROL-XL) 24 hr tablet 100 mg (has no administration in time range)  metFORMIN (GLUCOPHAGE) tablet 500 mg (has no administration in time range)  sacubitril-valsartan (ENTRESTO) 97-103 mg per tablet (1 tablet Oral Given 06/08/20 0251)  furosemide (LASIX) injection 40 mg (has no administration in time range)  amiodarone (PACERONE) tablet 200 mg (has no administration in time range)  oxyCODONE-acetaminophen (PERCOCET/ROXICET) 5-325 MG per tablet 1 tablet (has no administration in time range)    And  oxyCODONE (Oxy IR/ROXICODONE) immediate release tablet 5 mg (has no administration in time range)  furosemide (LASIX) injection 40 mg (40 mg Intravenous Given 06/07/20 2335)  magnesium sulfate IVPB 2 g 50 mL (0 g Intravenous Stopped 06/08/20 0226)    ED Course  I have reviewed the triage vital signs and the nursing notes.  Pertinent labs & imaging results that were available during my care of the patient were reviewed by me and considered in my medical decision making (see chart for details).    MDM Rules/Calculators/A&P                          54 year old male with a h/o chronic systolic and diastolic congestive heart failure, diabetes mellitus, HTN, paroxysmal atrial fibrillation on Eliquis, OSA on CPAP, and chronic back pain presenting with shortness of breath, orthopnea, PND, leg swelling, chest tightness with exertion, decreased urine output, concentrated urine, and sinus pain and pressure for the last week.  On exam, patient appears volume overloaded with abdominal distention, pretibial pitting edema, and crackles on pulmonary exam.  Oxygen saturation decreased to 87% with good waveform while I was in the room evaluating the patient and he was placed on 2 L nasal cannula.  Normotensive and without  tachypnea.  Afebrile.  Chest x-ray has been reviewed by me and demonstrates bilateral pulmonary edema consistent with CHF exacerbation.  This is consistent with elevated BNP of 832, which is likely low given that the patient is 142 kg.  He has hypomagnesemia that was treated in the ER.  EKG with sinus rhythm.  Initial troponin was normal at 16, repeat troponin is 32.  Patient is currently sitting in exam bed and has no complaints of chest pain, tightness, or discomfort.  Will continue to trend troponin.  Less likely ACS or NSTEMI.  I suspect this is likely secondary to demand from worsening CHF exacerbation.  Third troponin is downtrending at 27.  UA with mild proteinuria, but no evidence of infection.  Discussed with Dr. Manus Gunning, attending physician.  He will require admission for diuresis given that he is acutely hypoxic.  Consulted cardiology and Dr. Jacinto Halim will admit.  He recommended to place temporary admission orders with all of the patient's home medications, but to change his amiodarone to 200 mg daily (typically twice daily at home) and 40 mg of IV Lasix twice daily.  These orders have been placed.  Patient's home CPAP has been ordered.  The patient appears reasonably stabilized for admission considering the current resources, flow, and capabilities available in the ED at this time, and I doubt any other [facility] requiring further screening  and/or treatment in the ED prior to admission.   Final Clinical Impression(s) / ED Diagnoses Final diagnoses:  Acute on chronic combined systolic and diastolic congestive heart failure (HCC)  Acute respiratory failure with hypoxia (HCC)  Hypomagnesemia    Rx / DC Orders ED Discharge Orders    None       Barkley Boards, PA-C 06/08/20 0342    Glynn Octave, MD 06/08/20 [phone]

## 2020-06-08 DIAGNOSIS — M549 Dorsalgia, unspecified: Secondary | ICD-10-CM | POA: Diagnosis present

## 2020-06-08 DIAGNOSIS — Z79899 Other long term (current) drug therapy: Secondary | ICD-10-CM | POA: Diagnosis not present

## 2020-06-08 DIAGNOSIS — G894 Chronic pain syndrome: Secondary | ICD-10-CM | POA: Diagnosis present

## 2020-06-08 DIAGNOSIS — I5021 Acute systolic (congestive) heart failure: Secondary | ICD-10-CM | POA: Diagnosis not present

## 2020-06-08 DIAGNOSIS — I5023 Acute on chronic systolic (congestive) heart failure: Secondary | ICD-10-CM | POA: Diagnosis not present

## 2020-06-08 DIAGNOSIS — I428 Other cardiomyopathies: Secondary | ICD-10-CM | POA: Diagnosis not present

## 2020-06-08 DIAGNOSIS — Z20822 Contact with and (suspected) exposure to covid-19: Secondary | ICD-10-CM | POA: Diagnosis not present

## 2020-06-08 DIAGNOSIS — Z8249 Family history of ischemic heart disease and other diseases of the circulatory system: Secondary | ICD-10-CM | POA: Diagnosis not present

## 2020-06-08 DIAGNOSIS — R0609 Other forms of dyspnea: Secondary | ICD-10-CM | POA: Diagnosis not present

## 2020-06-08 DIAGNOSIS — J9621 Acute and chronic respiratory failure with hypoxia: Secondary | ICD-10-CM | POA: Diagnosis not present

## 2020-06-08 DIAGNOSIS — I480 Paroxysmal atrial fibrillation: Secondary | ICD-10-CM | POA: Diagnosis present

## 2020-06-08 DIAGNOSIS — I509 Heart failure, unspecified: Secondary | ICD-10-CM

## 2020-06-08 DIAGNOSIS — G4733 Obstructive sleep apnea (adult) (pediatric): Secondary | ICD-10-CM | POA: Diagnosis present

## 2020-06-08 DIAGNOSIS — E119 Type 2 diabetes mellitus without complications: Secondary | ICD-10-CM | POA: Diagnosis not present

## 2020-06-08 DIAGNOSIS — E876 Hypokalemia: Secondary | ICD-10-CM | POA: Diagnosis not present

## 2020-06-08 DIAGNOSIS — Z7901 Long term (current) use of anticoagulants: Secondary | ICD-10-CM | POA: Diagnosis not present

## 2020-06-08 DIAGNOSIS — Z7984 Long term (current) use of oral hypoglycemic drugs: Secondary | ICD-10-CM | POA: Diagnosis not present

## 2020-06-08 DIAGNOSIS — J9601 Acute respiratory failure with hypoxia: Secondary | ICD-10-CM | POA: Diagnosis not present

## 2020-06-08 DIAGNOSIS — N179 Acute kidney failure, unspecified: Secondary | ICD-10-CM | POA: Diagnosis not present

## 2020-06-08 DIAGNOSIS — I110 Hypertensive heart disease with heart failure: Secondary | ICD-10-CM | POA: Diagnosis not present

## 2020-06-08 DIAGNOSIS — Z6841 Body Mass Index (BMI) 40.0 and over, adult: Secondary | ICD-10-CM | POA: Diagnosis not present

## 2020-06-08 DIAGNOSIS — I5043 Acute on chronic combined systolic (congestive) and diastolic (congestive) heart failure: Secondary | ICD-10-CM | POA: Diagnosis not present

## 2020-06-08 LAB — BASIC METABOLIC PANEL
Anion gap: 6 (ref 5–15)
Anion gap: 7 (ref 5–15)
BUN: 10 mg/dL (ref 6–20)
BUN: 12 mg/dL (ref 6–20)
CO2: 14 mmol/L — ABNORMAL LOW (ref 22–32)
CO2: 18 mmol/L — ABNORMAL LOW (ref 22–32)
Calcium: 5.3 mg/dL — CL (ref 8.9–10.3)
Calcium: 6.5 mg/dL — ABNORMAL LOW (ref 8.9–10.3)
Chloride: 122 mmol/L — ABNORMAL HIGH (ref 98–111)
Chloride: 117 mmol/L — ABNORMAL HIGH (ref 98–111)
Creatinine, Ser: 0.64 mg/dL (ref 0.61–1.24)
Creatinine, Ser: 0.86 mg/dL (ref 0.61–1.24)
GFR calc Af Amer: >60 mL/min (ref >60)
GFR calc Af Amer: >60 mL/min (ref >60)
GFR calc non Af Amer: >60 mL/min (ref >60)
GFR calc non Af Amer: >60 mL/min (ref >60)
Glucose, Bld: 79 mg/dL (ref 70–99)
Glucose, Bld: 126 mg/dL — ABNORMAL HIGH (ref 70–99)
Potassium: 2.1 mmol/L — CL (ref 3.5–5.1)
Potassium: 2.6 mmol/L — CL (ref 3.5–5.1)
Sodium: 142 mmol/L (ref 135–145)
Sodium: 142 mmol/L (ref 135–145)

## 2020-06-08 LAB — CBG MONITORING, ED
Glucose-Capillary: 145 mg/dL — ABNORMAL HIGH (ref 70–99)
Glucose-Capillary: 141 mg/dL — ABNORMAL HIGH (ref 70–99)

## 2020-06-08 LAB — HEMOGLOBIN A1C
Hgb A1c MFr Bld: 6.9 % — ABNORMAL HIGH (ref 4.8–5.6)
Mean Plasma Glucose: 151.33 mg/dL

## 2020-06-08 LAB — POTASSIUM: Potassium: 4.1 mmol/L (ref 3.5–5.1)

## 2020-06-08 LAB — TROPONIN I (HIGH SENSITIVITY): Troponin I (High Sensitivity): 27 ng/L — ABNORMAL HIGH (ref <18)

## 2020-06-08 LAB — HIV ANTIBODY (ROUTINE TESTING W REFLEX): HIV Screen 4th Generation wRfx: NONREACTIVE

## 2020-06-08 LAB — SARS CORONAVIRUS 2 BY RT PCR (HOSPITAL ORDER, PERFORMED IN ~~LOC~~ HOSPITAL LAB): SARS Coronavirus 2: NEGATIVE

## 2020-06-08 LAB — BRAIN NATRIURETIC PEPTIDE
B Natriuretic Peptide: 831.9 pg/mL — ABNORMAL HIGH (ref 0.0–100.0)
B Natriuretic Peptide: 996.6 pg/mL — ABNORMAL HIGH (ref 0.0–100.0)

## 2020-06-08 LAB — GLUCOSE, CAPILLARY
Glucose-Capillary: 116 mg/dL — ABNORMAL HIGH (ref 70–99)
Glucose-Capillary: 103 mg/dL — ABNORMAL HIGH (ref 70–99)

## 2020-06-08 LAB — MAGNESIUM
Magnesium: 1 mg/dL — ABNORMAL LOW (ref 1.7–2.4)
Magnesium: 2.6 mg/dL — ABNORMAL HIGH (ref 1.7–2.4)

## 2020-06-08 MED ORDER — AMIODARONE HCL 200 MG PO TABS
200.0000 mg | ORAL_TABLET | Freq: Every day | ORAL | Status: DC
  Administered 2020-06-08 – 2020-06-10 (×3): 200 mg via ORAL
  Filled 2020-06-08 (×3): qty 1

## 2020-06-08 MED ORDER — OXYCODONE-ACETAMINOPHEN 10-325 MG PO TABS: 1.0000 | ORAL_TABLET | ORAL | Status: DC | PRN

## 2020-06-08 MED ORDER — TAMSULOSIN HCL 0.4 MG PO CAPS
0.4000 mg | ORAL_CAPSULE | Freq: Two times a day (BID) | ORAL | Status: DC
  Administered 2020-06-08 – 2020-06-10 (×5): 0.4 mg via ORAL
  Filled 2020-06-08 (×6): qty 1

## 2020-06-08 MED ORDER — ALBUTEROL SULFATE HFA 108 (90 BASE) MCG/ACT IN AERS
1.0000 | INHALATION_SPRAY | Freq: Four times a day (QID) | RESPIRATORY_TRACT | Status: DC | PRN
  Filled 2020-06-08: qty 6.7

## 2020-06-08 MED ORDER — MAGNESIUM SULFATE 4 GM/100ML IV SOLN
4.0000 g | Freq: Once | INTRAVENOUS | Status: AC
  Administered 2020-06-08: 4 g via INTRAVENOUS
  Filled 2020-06-08: qty 100

## 2020-06-08 MED ORDER — SODIUM CHLORIDE 0.9% FLUSH: 3.0000 mL | INTRAVENOUS | Status: DC | PRN

## 2020-06-08 MED ORDER — SPIRONOLACTONE 25 MG PO TABS
50.0000 mg | ORAL_TABLET | Freq: Every day | ORAL | Status: DC
  Filled 2020-06-08: qty 2

## 2020-06-08 MED ORDER — POTASSIUM CHLORIDE CRYS ER 20 MEQ PO TBCR
40.0000 meq | EXTENDED_RELEASE_TABLET | Freq: Two times a day (BID) | ORAL | Status: AC
  Administered 2020-06-08 (×2): 40 meq via ORAL
  Filled 2020-06-08 (×2): qty 2

## 2020-06-08 MED ORDER — AMIODARONE HCL 200 MG PO TABS: 200.0000 mg | ORAL_TABLET | Freq: Two times a day (BID) | ORAL | Status: DC

## 2020-06-08 MED ORDER — ALBUTEROL SULFATE (2.5 MG/3ML) 0.083% IN NEBU: 2.5000 mg | INHALATION_SOLUTION | Freq: Four times a day (QID) | RESPIRATORY_TRACT | Status: DC | PRN

## 2020-06-08 MED ORDER — ONDANSETRON HCL 4 MG/2ML IJ SOLN: 4.0000 mg | Freq: Four times a day (QID) | INTRAMUSCULAR | Status: DC | PRN

## 2020-06-08 MED ORDER — FUROSEMIDE 10 MG/ML IJ SOLN
40.0000 mg | Freq: Two times a day (BID) | INTRAMUSCULAR | Status: DC
  Administered 2020-06-08: 40 mg via INTRAVENOUS
  Filled 2020-06-08: qty 4

## 2020-06-08 MED ORDER — POTASSIUM CHLORIDE CRYS ER 20 MEQ PO TBCR: 40.0000 meq | EXTENDED_RELEASE_TABLET | Freq: Once | ORAL | Status: DC

## 2020-06-08 MED ORDER — METFORMIN HCL 500 MG PO TABS
500.0000 mg | ORAL_TABLET | Freq: Two times a day (BID) | ORAL | Status: DC
  Administered 2020-06-08 – 2020-06-10 (×5): 500 mg via ORAL
  Filled 2020-06-08 (×5): qty 1

## 2020-06-08 MED ORDER — SODIUM CHLORIDE 0.9 % IV SOLN: 250.0000 mL | INTRAVENOUS | Status: DC | PRN

## 2020-06-08 MED ORDER — ACETAMINOPHEN 325 MG PO TABS: 650.0000 mg | ORAL_TABLET | ORAL | Status: DC | PRN

## 2020-06-08 MED ORDER — OXYCODONE HCL 5 MG PO TABS
5.0000 mg | ORAL_TABLET | ORAL | Status: DC | PRN
  Administered 2020-06-09: 5 mg via ORAL
  Filled 2020-06-08: qty 1

## 2020-06-08 MED ORDER — OXYCODONE-ACETAMINOPHEN 5-325 MG PO TABS
1.0000 | ORAL_TABLET | ORAL | Status: DC | PRN
  Administered 2020-06-08 – 2020-06-10 (×6): 1 via ORAL
  Filled 2020-06-08 (×6): qty 1

## 2020-06-08 MED ORDER — FLUTICASONE PROPIONATE 50 MCG/ACT NA SUSP
1.0000 | Freq: Every day | NASAL | Status: DC | PRN
  Filled 2020-06-08: qty 16

## 2020-06-08 MED ORDER — RIVAROXABAN 20 MG PO TABS
20.0000 mg | ORAL_TABLET | Freq: Every day | ORAL | Status: DC
  Administered 2020-06-08 – 2020-06-09 (×3): 20 mg via ORAL
  Filled 2020-06-08 (×4): qty 1

## 2020-06-08 MED ORDER — MAGNESIUM SULFATE 2 GM/50ML IV SOLN
2.0000 g | Freq: Once | INTRAVENOUS | Status: AC
  Administered 2020-06-08: 2 g via INTRAVENOUS
  Filled 2020-06-08: qty 50

## 2020-06-08 MED ORDER — METOPROLOL SUCCINATE ER 100 MG PO TB24
100.0000 mg | ORAL_TABLET | Freq: Every day | ORAL | Status: DC
  Administered 2020-06-08 – 2020-06-10 (×3): 100 mg via ORAL
  Filled 2020-06-08 (×3): qty 1

## 2020-06-08 MED ORDER — METHOCARBAMOL 500 MG PO TABS
750.0000 mg | ORAL_TABLET | Freq: Three times a day (TID) | ORAL | Status: DC | PRN
  Administered 2020-06-08: 750 mg via ORAL
  Filled 2020-06-08: qty 2

## 2020-06-08 MED ORDER — FUROSEMIDE 10 MG/ML IJ SOLN
40.0000 mg | Freq: Every day | INTRAMUSCULAR | Status: DC
  Administered 2020-06-09 – 2020-06-10 (×2): 40 mg via INTRAVENOUS
  Filled 2020-06-08 (×2): qty 4

## 2020-06-08 MED ORDER — INSULIN ASPART 100 UNIT/ML ~~LOC~~ SOLN
0.0000 [IU] | Freq: Three times a day (TID) | SUBCUTANEOUS | Status: DC
  Administered 2020-06-08 – 2020-06-09 (×2): 3 [IU] via SUBCUTANEOUS

## 2020-06-08 MED ORDER — SACUBITRIL-VALSARTAN 97-103 MG PO TABS
1.0000 | ORAL_TABLET | Freq: Two times a day (BID) | ORAL | Status: DC
  Administered 2020-06-08 – 2020-06-09 (×5): 1 via ORAL
  Filled 2020-06-08 (×6): qty 1

## 2020-06-08 MED ORDER — SODIUM CHLORIDE 0.9% FLUSH
3.0000 mL | Freq: Two times a day (BID) | INTRAVENOUS | Status: DC
  Administered 2020-06-08 – 2020-06-10 (×3): 3 mL via INTRAVENOUS

## 2020-06-08 NOTE — ED Notes (Signed)
MD Jacinto Halim called about k 2.6. See new orders.

## 2020-06-08 NOTE — ED Notes (Signed)
Patient placed on 2L [place] for SpO2 of 86% on room air. SpO2 on 2L [place] 97%.

## 2020-06-08 NOTE — H&P (Signed)
Juan Snyder is an 54 y.o. male.   Chief Complaint: Shortness of breath HPI:   54 year old African-American male with nonischemic cardiomyopathy, 07/2018, paroxysmal atrial fibrillation/flutter status post successful cardioversion 08/13/2018, not a candidate for ablation due severe LAA enlargement, hypertension, type II diabetes mellitus, morbid obesity, obstructive sleep apnea on CPAP.  Patient was supposed to see me last week for office follow up. He forgot about the appointment. Last couple days, he has had worsening shortness of breath, thus came to hospital on 7/20 night. Workup showed pulmonary edema, elevated BNP, severe hypokalemia. He has been taking lasix 40 mg twice daily.   Of note, he has not tolerated spironolactone in the past due to gynecomastia.   Past Medical History:  Diagnosis Date  . Back pain, chronic   . CHF (congestive heart failure) (HCC)   . Diabetes mellitus   . Dyspnea   . Hypertension   . Obesity   . Sleep apnea    uses CPAP    Past Surgical History:  Procedure Laterality Date  . CARDIOVERSION N/A 02/18/2018   Procedure: CARDIOVERSION;  Surgeon: Elder Negus, MD;  Location: [facility];  Service: Cardiovascular;  Laterality: N/A;  . CARDIOVERSION N/A 08/13/2018   Procedure: CARDIOVERSION;  Surgeon: Elder Negus, MD;  Location: [facility];  Service: Cardiovascular;  Laterality: N/A;  . RIGHT/LEFT HEART CATH AND CORONARY ANGIOGRAPHY N/A 08/12/2018   Procedure: RIGHT/LEFT HEART CATH AND CORONARY ANGIOGRAPHY;  Surgeon: Elder Negus, MD;  Location: [facility];  Service: Cardiovascular;  Laterality: N/A;    Family History  Problem Relation Age of Onset  . Hypertension Mother   . Hypertension Brother    Social History:  reports that he has never smoked. He has never used smokeless tobacco. He reports current alcohol use. He reports that he does not use drugs.  Allergies: No Known Allergies  Review of Systems   Constitutional: Negative for decreased appetite, malaise/fatigue, weight gain and weight loss.  HENT: Negative for congestion.   Eyes: Negative for visual disturbance.  Cardiovascular: Positive for dyspnea on exertion. Negative for chest pain, leg swelling, palpitations and syncope.  Respiratory: Positive for shortness of breath. Negative for cough.   Endocrine: Negative for cold intolerance.  Hematologic/Lymphatic: Does not bruise/bleed easily.  Skin: Negative for itching and rash.  Musculoskeletal: Negative for myalgias.  Gastrointestinal: Negative for abdominal pain, nausea and vomiting.  Genitourinary: Negative for dysuria.  Neurological: Negative for dizziness and weakness.  Psychiatric/Behavioral: The patient is not nervous/anxious.   All other systems reviewed and are negative.    Blood pressure 115/73, pulse (!) 50, temperature 99.5 F (37.5 C), temperature source Oral, resp. rate 15, height  (1.727 m), weight (!) 141.5 kg, SpO2 94 %. Body mass index is 47.44 kg/m.  Physical Exam Vitals and nursing note reviewed.  Constitutional:      General: He is not in acute distress.    Appearance: He is well-developed. He is obese.  HENT:     Head: Normocephalic and atraumatic.  Eyes:     Conjunctiva/sclera: Conjunctivae normal.     Pupils: Pupils are equal, round, and reactive to light.  Neck:     Vascular: No JVD.  Cardiovascular:     Rate and Rhythm: Normal rate and regular rhythm.     Pulses: Normal pulses and intact distal pulses.     Heart sounds: No murmur heard.   Pulmonary:     Effort: Pulmonary effort is normal.  Breath sounds: Examination of the right-lower field reveals decreased breath sounds. Examination of the left-lower field reveals decreased breath sounds. Decreased breath sounds present. No wheezing or rales.  Abdominal:     General: Bowel sounds are normal.     Palpations: Abdomen is soft.     Tenderness: There is no rebound.  Musculoskeletal:         General: No tenderness. Normal range of motion.     Right lower leg: No edema.     Left lower leg: No edema.  Lymphadenopathy:     Cervical: No cervical adenopathy.  Skin:    General: Skin is warm and dry.  Neurological:     Mental Status: He is alert and oriented to person, place, and time.     Cranial Nerves: No cranial nerve deficit.     Results for orders placed or performed during the hospital encounter of 06/07/20 (from the past 48 hour(s))  Basic metabolic panel     Status: Abnormal   Collection Time: 06/07/20  1:05 PM  Result Value Ref Range   Sodium 137 135 - 145 mmol/L   Potassium 3.7 3.5 - 5.1 mmol/L   Chloride 103 98 - 111 mmol/L   CO2 22 22 - 32 mmol/L   Glucose, Bld 165 (H) 70 - 99 mg/dL    Comment: Glucose reference range applies only to samples taken after fasting for at least 8 hours.   BUN 15 6 - 20 mg/dL   Creatinine, Ser 5.36 (H) 0.61 - 1.24 mg/dL   Calcium 9.2 8.9 - 14.4 mg/dL   GFR calc non Af Amer >60 >60 mL/min   GFR calc Af Amer >60 >60 mL/min   Anion gap 12 5 - 15    Comment: Performed at [facility], [address] [place], [place] [address]  CBC     Status: Abnormal   Collection Time: 06/07/20  1:05 PM  Result Value Ref Range   WBC 9.0 4.0 - 10.5 K/uL   RBC 4.03 (L) 4.22 - 5.81 MIL/uL   Hemoglobin 11.0 (L) 13.0 - 17.0 g/dL   HCT 08.6 (L) 39 - 52 %   MCV 85.9 80.0 - 100.0 fL   MCH 27.3 26.0 - 34.0 pg   MCHC 31.8 30.0 - 36.0 g/dL   RDW 76.1 95.0 - 93.2 %   Platelets 139 (L) 150 - 400 K/uL    Comment: REPEATED TO VERIFY   nRBC 0.0 0.0 - 0.2 %    Comment: Performed at [facility], [address] [place], [place] [address]  Troponin I (High Sensitivity)     Status: None   Collection Time: 06/07/20  1:05 PM  Result Value Ref Range   Troponin I (High Sensitivity) 16 <18 ng/L    Comment: (NOTE) Elevated high sensitivity troponin I (hsTnI) values and significant  changes across serial measurements may suggest ACS but many  other  chronic and acute conditions are known to elevate hsTnI results.  Refer to the "Links" section for chest pain algorithms and additional  guidance. Performed at [facility], [address], [place], [place] [address]   Troponin I (High Sensitivity)     Status: Abnormal   Collection Time: 06/07/20  8:02 PM  Result Value Ref Range   Troponin I (High Sensitivity) 32 (H) <18 ng/L    Comment: (NOTE) Elevated high sensitivity troponin I (hsTnI) values and significant  changes across serial measurements may suggest ACS but many  other  chronic and acute conditions are known to elevate hsTnI results.  Refer to the "Links" section for chest pain algorithms and additional  guidance. Performed at [facility], [address] [place], [place] [address]   Troponin I (High Sensitivity)     Status: Abnormal   Collection Time: 06/07/20 11:10 PM  Result Value Ref Range   Troponin I (High Sensitivity) 27 (H) <18 ng/L    Comment: (NOTE) Elevated high sensitivity troponin I (hsTnI) values and significant  changes across serial measurements may suggest ACS but many other  chronic and acute conditions are known to elevate hsTnI results.  Refer to the "Links" section for chest pain algorithms and additional  guidance. Performed at [facility], [address] [place], [place] [address]   Brain natriuretic peptide     Status: Abnormal   Collection Time: 06/07/20 11:23 PM  Result Value Ref Range   B Natriuretic Peptide 831.9 (H) 0.0 - 100.0 pg/mL    Comment: Performed at [facility], [address] [place], [place] [address]  Urinalysis, Routine w reflex microscopic     Status: Abnormal   Collection Time: 06/07/20 11:23 PM  Result Value Ref Range   Color, Urine AMBER (A) YELLOW    Comment: BIOCHEMICALS MAY BE AFFECTED BY COLOR   APPearance CLEAR CLEAR   Specific Gravity, Urine 1.019 1.005 - 1.030   pH 5.0 5.0 - 8.0   Glucose, UA NEGATIVE NEGATIVE mg/dL   Hgb  urine dipstick NEGATIVE NEGATIVE   Bilirubin Urine NEGATIVE NEGATIVE   Ketones, ur NEGATIVE NEGATIVE mg/dL   Protein, ur 841 (A) NEGATIVE mg/dL   Nitrite NEGATIVE NEGATIVE   Leukocytes,Ua NEGATIVE NEGATIVE   RBC / HPF 0-5 0 - 5 RBC/hpf   WBC, UA 0-5 0 - 5 WBC/hpf   Bacteria, UA NONE SEEN NONE SEEN   Mucus PRESENT     Comment: Performed at [facility], [address] [place], [place] [address]  Magnesium     Status: Abnormal   Collection Time: 06/07/20 11:27 PM  Result Value Ref Range   Magnesium 1.0 (L) 1.7 - 2.4 mg/dL    Comment: Performed at [facility], [address] [place], [place] [address]  SARS Coronavirus 2 by RT PCR (hospital order, performed in [facility] hospital lab) Nasopharyngeal Nasopharyngeal Swab     Status: None   Collection Time: 06/07/20 11:29 PM   Specimen: Nasopharyngeal Swab  Result Value Ref Range   SARS Coronavirus 2 NEGATIVE NEGATIVE    Comment: (NOTE) SARS-CoV-2 target nucleic acids are NOT DETECTED.  The SARS-CoV-2 RNA is generally detectable in upper and lower respiratory specimens during the acute phase of infection. The lowest concentration of SARS-CoV-2 viral copies this assay can detect is 250 copies / mL. A negative result does not preclude SARS-CoV-2 infection and should not be used as the sole basis for treatment or other patient management decisions.  A negative result may occur with improper specimen collection / handling, submission of specimen other than nasopharyngeal swab, presence of viral mutation(s) within the areas targeted by this assay, and inadequate number of viral copies (<250 copies / mL). A negative result must be combined with clinical observations, patient history, and epidemiological information.  Fact Sheet for Patients:   BoilerBrush.com.cy  Fact Sheet for Healthcare Providers: https://pope.com/  This test is not yet approved or  cleared by the [place] FDA and has been authorized for detection  and/or diagnosis of SARS-CoV-2 by FDA under an Emergency Use Authorization (EUA).  This EUA will remain in effect (meaning this test can be used) for the duration of the COVID-19 declaration under Section 564(b)(1) of the Act, 21 U.S.C. section 360bbb-3(b)(1), unless the authorization is terminated or revoked sooner.  Performed at [facility], [address] [place], [place] [address]   Basic metabolic panel     Status: Abnormal   Collection Time: 06/08/20  1:20 AM  Result Value Ref Range   Sodium 142 135 - 145 mmol/L   Potassium 2.1 (LL) 3.5 - 5.1 mmol/L    Comment: CRITICAL RESULT CALLED TO, READ BACK BY AND VERIFIED WITH: RN J SAWATZKI @0159  06/08/20 BY S GEZAHEGN    Chloride 122 (H) 98 - 111 mmol/L   CO2 14 (L) 22 - 32 mmol/L   Glucose, Bld 79 70 - 99 mg/dL    Comment: Glucose reference range applies only to samples taken after fasting for at least 8 hours.   BUN 10 6 - 20 mg/dL   Creatinine, Ser 06/10/20 0.61 - 1.24 mg/dL   Calcium 5.3 (LL) 8.9 - 10.3 mg/dL    Comment: CRITICAL RESULT CALLED TO, READ BACK BY AND VERIFIED WITH: RN J SAWATZKI @0159  06/08/20 BY S GEZAHEGN    GFR calc non Af Amer >60 >60 mL/min   GFR calc Af Amer >60 >60 mL/min   Anion gap 6 5 - 15    Comment: Performed at [facility], [address] [place], [address] [place]  Brain natriuretic peptide     Status: Abnormal   Collection Time: 06/08/20  4:27 AM  Result Value Ref Range   B Natriuretic Peptide 996.6 (H) 0.0 - 100.0 pg/mL    Comment: Performed at [facility], [address] [place], [address] [place]  Basic metabolic panel     Status: Abnormal   Collection Time: 06/08/20  6:05 AM  Result Value Ref Range   Sodium 142 135 - 145 mmol/L   Potassium 2.6 (LL) 3.5 - 5.1 mmol/L    Comment: CRITICAL RESULT CALLED TO, READ BACK BY AND VERIFIED WITH: [id number] RN 06/10/20 [phone] BY A BENNETT    Chloride 117 (H) 98 - 111 mmol/L   CO2 18  (L) 22 - 32 mmol/L   Glucose, Bld 126 (H) 70 - 99 mg/dL    Comment: Glucose reference range applies only to samples taken after fasting for at least 8 hours.   BUN 12 6 - 20 mg/dL   Creatinine, Ser 7829 0.61 - 1.24 mg/dL   Calcium 6.5 (L) 8.9 - 10.3 mg/dL   GFR calc non Af Amer >60 >60 mL/min   GFR calc Af Amer >60 >60 mL/min   Anion gap 7 5 - 15    Comment: Performed at [facility], [address], [place], [address] [place]    Labs:   Lab Results  Component Value Date   WBC 9.0 06/07/2020   HGB 11.0 (L) 06/07/2020   HCT 34.6 (L) 06/07/2020   MCV 85.9 06/07/2020   PLT 139 (L) 06/07/2020    Recent Labs  Lab 06/08/20 0605  NA 142  K 2.6*  CL 117*  CO2 18*  BUN 12  CREATININE 0.86  CALCIUM 6.5*  GLUCOSE 126*    Lipid Panel     Component Value Date/Time   CHOL 214 (H) 02/16/2010 2016   TRIG 284 (H) 02/16/2010 2016  HDL 50 02/16/2010 2016   CHOLHDL 4.3 Ratio 02/16/2010 2016   VLDL 57 (H) 02/16/2010 2016   LDLCALC 107 (H) 02/16/2010 2016    BNP (last 3 results) Recent Labs    06/07/20 2323 06/08/20 0427  BNP 831.9* 996.6*    HEMOGLOBIN A1C Lab Results  Component Value Date   HGBA1C 7.5 (H) 08/11/2018   MPG 168.55 08/11/2018    Cardiac Panel (last 3 results) No results for input(s): CKTOTAL, CKMB, RELINDX in the last 8760 hours.  Invalid input(s): TROPONINHS  Lab Results  Component Value Date   CKTOTAL 269 (H) 08/19/2009   CKMB 2.8 08/19/2009     TSH Recent Labs    04/28/20 1350  TSH 1.830     (Not in a hospital admission)     Current Facility-Administered Medications:  .  0.9 %  sodium chloride infusion, 250 mL, Intravenous, PRN, Brightyn Mozer J, MD .  acetaminophen (TYLENOL) tablet 650 mg, 650 mg, Oral, Q4H PRN, Zailyn Thoennes J, MD .  albuterol (VENTOLIN HFA) 108 (90 Base) MCG/ACT inhaler 1 puff, 1 puff, Inhalation, Q6H PRN, McDonald, Mia A, PA-C .  amiodarone (PACERONE) tablet 200 mg, 200 mg, Oral, Daily,  McDonald, Mia A, PA-C .  amiodarone (PACERONE) tablet 200 mg, 200 mg, Oral, BID, Iviona Hole J, MD .  fluticasone (FLONASE) 50 MCG/ACT nasal spray 1 spray, 1 spray, Each Nare, Daily PRN, Nereyda Bowler J, MD .  furosemide (LASIX) injection 40 mg, 40 mg, Intravenous, BID, McDonald, Mia A, PA-C .  metFORMIN (GLUCOPHAGE) tablet 500 mg, 500 mg, Oral, BID WC, McDonald, Mia A, PA-C .  methocarbamol (ROBAXIN) tablet 750 mg, 750 mg, Oral, Q8H PRN, Chastity Noland J, MD .  metoprolol succinate (TOPROL-XL) 24 hr tablet 100 mg, 100 mg, Oral, Daily, McDonald, Mia A, PA-C .  ondansetron (ZOFRAN) injection 4 mg, 4 mg, Intravenous, Q6H PRN, Lili Harts J, MD .  oxyCODONE-acetaminophen (PERCOCET/ROXICET) 5-325 MG per tablet 1 tablet, 1 tablet, Oral, Q4H PRN **AND** oxyCODONE (Oxy IR/ROXICODONE) immediate release tablet 5 mg, 5 mg, Oral, Q4H PRN, Yates Decamp, MD .  potassium chloride SA (KLOR-CON) CR tablet 40 mEq, 40 mEq, Oral, Once, Yates Decamp, MD .  potassium chloride SA (KLOR-CON) CR tablet 40 mEq, 40 mEq, Oral, BID, Noel Rodier J, MD .  rivaroxaban (XARELTO) tablet 20 mg, 20 mg, Oral, Q supper, McDonald, Mia A, PA-C, 20 mg at 06/08/20 0252 .  sacubitril-valsartan (ENTRESTO) 97-103 mg per tablet, 1 tablet, Oral, BID, McDonald, Mia A, PA-C, 1 tablet at 06/08/20 0251 .  sodium chloride flush (NS) 0.9 % injection 3 mL, 3 mL, Intravenous, Once, Terrilee Files, MD .  sodium chloride flush (NS) 0.9 % injection 3 mL, 3 mL, Intravenous, Q12H, Domenique Quest J, MD .  sodium chloride flush (NS) 0.9 % injection 3 mL, 3 mL, Intravenous, PRN, Tharun Cappella J, MD .  spironolactone (ALDACTONE) tablet 50 mg, 50 mg, Oral, Daily, Trinh Sanjose J, MD .  tamsulosin (FLOMAX) capsule 0.4 mg, 0.4 mg, Oral, BID, McDonald, Mia A, PA-C, 0.4 mg at 06/08/20 0251  Current Outpatient Medications:  .  albuterol (PROVENTIL HFA;VENTOLIN HFA) 108 (90 Base) MCG/ACT inhaler, Inhale 1 puff into the  lungs every 6 (six) hours as needed for wheezing or shortness of breath., Disp: , Rfl:  .  allopurinol (ZYLOPRIM) 300 MG tablet, Take 300 mg by mouth continuous as needed. , Disp: , Rfl:  .  amiodarone (PACERONE) 200 MG tablet, Take 200 mg by mouth 2 (two)  times daily., Disp: , Rfl:  .  diphenhydrAMINE (BENADRYL) 25 MG tablet, Take 25 mg by mouth daily as needed for itching or allergies., Disp: , Rfl:  .  ENTRESTO 97-103 MG, TAKE 1 TABLET BY MOUTH TWICE A DAY (Patient taking differently: Take 1 tablet by mouth 2 (two) times daily. ), Disp: 60 tablet, Rfl: 3 .  fluticasone (FLONASE) 50 MCG/ACT nasal spray, Place 1 spray into both nostrils daily as needed for allergies. , Disp: , Rfl:  .  furosemide (LASIX) 40 MG tablet, Take 1 tablet (40 mg total) by mouth as needed for edema., Disp: 60 tablet, Rfl: 0 .  loratadine (CLARITIN) 10 MG tablet, Take 10 mg by mouth daily as needed for allergies., Disp: , Rfl:  .  metFORMIN (GLUCOPHAGE) 500 MG tablet, TAKE 1 TABLET BY MOUTH 2 TIMES DAILY WITH A MEAL. (Patient taking differently: Take 500 mg by mouth 2 (two) times daily with a meal. ), Disp: 180 tablet, Rfl: 1 .  methocarbamol (ROBAXIN) 750 MG tablet, Take 750 mg by mouth every 8 (eight) hours as needed for muscle spasms., Disp: , Rfl:  .  metoprolol succinate (TOPROL-XL) 100 MG 24 hr tablet, Take 1 tablet (100 mg total) by mouth daily. Take with or immediately following a meal., Disp: 30 tablet, Rfl: 0 .  Multiple Vitamins-Minerals (MULTIVITAMIN PO), Take 1 tablet by mouth daily., Disp: , Rfl:  .  naproxen sodium (ALEVE) 220 MG tablet, Take 440 mg by mouth daily as needed (pain)., Disp: , Rfl:  .  oxyCODONE-acetaminophen (PERCOCET) 10-325 MG tablet, Take 1 tablet by mouth every 4 (four) hours as needed for pain., Disp: , Rfl:  .  rivaroxaban (XARELTO) 20 MG TABS tablet, Take 1 tablet (20 mg total) by mouth daily., Disp: 90 tablet, Rfl: 3 .  tadalafil (CIALIS) 20 MG tablet, Take 20 mg by mouth daily as needed  for erectile dysfunction. , Disp: , Rfl:  .  tamsulosin (FLOMAX) 0.4 MG CAPS capsule, Take 0.4 mg by mouth 2 (two) times daily. , Disp: , Rfl:    Today's Vitals   06/08/20 0000 06/08/20 0355 06/08/20 0652 06/08/20 0700  BP: (!) 130/107  118/76 115/73  Pulse: (!) 56 62 (!) 46 (!) 50  Resp: (!) Temp:      TempSrc:      SpO2: 94% 93% 97% 94%  Weight:      Height:      PainSc:       Body mass index is 47.44 kg/m.    CARDIAC STUDIES:  EKG 06/07/2020: Sinus rhythm First degree AV block Low voltage Nonspecific ST-T abnormality   Echocardiogram 04/21/2019 : Left ventricle cavity is mildly dilated. Moderate concentric hypertrophy of the left ventricle. Hypokinetic global wall motion. Moderately depressed LV systolic function with EF 40%. Doppler evidence of grade III (restrictive) diastolic dysfunction, elevated LAP.  Left atrial cavity is severely dilated at 5.2 cm. Right atrial cavity is mildly dilated. Mild mitral valve leaflet thickening. Mild prolapse of the mitral valve leaflets. Moderate (Grade III) posteriorly directed mitral regurgitation. Mild tricuspid regurgitation. Moderate pulmonary hypertension. Estimated pulmonary artery systolic pressure is 51mm Hg with RA pressure estimated at 15 mm Hg. RVSP measures 51 mmHg. IVC is dilated with blunted respiratory response. compared to the study done on 08/10/2018, EF has improved from 20%.  EKG 09/24/2018: Sinus rhythm 80 bpm. First degree AV block. Left atrial enlargement. Incomplete RBBB.  R&LHC 08/12/2018: Left dominant circulation with no significant CAD Mildly  elevated LVEDP (17 mmHg) Moderate WHO Grp II PH (Mean PA 33 mmHg) Mildly decompensated arrhythmia induced nonischemic cardiomyopathy. Continue guideline directed heart failure therapy, and management of Afib.  Assessment/Plan  54 year old African-American male with nonischemic cardiomyopathy, 07/2018, paroxysmal atrial fibrillation/flutter status  post successful cardioversion 08/13/2018, not a candidate for ablation due severe LAA enlargement, hypertension, type II diabetes mellitus, morbid obesity, obstructive sleep apnea on CPAP, admitted with acute on chronic systolic heart failure  Acute on chronic systolic heart failure: Mild decompensation. Unable to use high dose lasix due to severe hypokalemia. Unable to use spironolactone due to h/o gynecomastia while on it.  Cautious diuresis IV lasix 40 mg daily, with K and Mg replacement.  Recheck K and Mg later today.  Nonscehmic cardiomyopathy: EF improved to 40% on guideline directed medical therapy.  Continue Entresto to 97-103 mg bid, metoprolol succinate 100 mg daily.  Paroxysmal Afib/flutter: CHA2DS2VASc score 3, annual stroke risk 3.6% Continue Xarelto 20 mg daily. Continue metoprolol succiante 100 mg daily, amiodarone 200 mg, currently taking daily. Maintaining sinus rhythm. Continue management of OSA with CPAP, weight loss.  Hypertension: Well controlled  OSA: Severe, on CPAP.   Type II DM: Sliding scale insulin.    Elder Negus, MD 06/08/2020, 8:14 AM [facility] Pager: [phone] Office: [phone] If no answer: [phone]

## 2020-06-08 NOTE — ED Notes (Signed)
Lunch Tray Ordered @ 1023. 

## 2020-06-08 NOTE — ED Notes (Signed)
Breakfast ordered--Juan Snyder 

## 2020-06-08 NOTE — Plan of Care (Signed)

## 2020-06-08 NOTE — Progress Notes (Signed)
   06/08/20 1603  Vitals  Temp 98.1 F (36.7 C)  Temp Source Oral  BP 135/81  MAP (mmHg) 96  BP Location Left Arm  BP Method Automatic  Patient Position (if appropriate) Lying  Pulse Rate (!) 58  Resp 18  MEWS COLOR  MEWS Score Color Green  Oxygen Therapy  SpO2 100 %  O2 Device Nasal Cannula  O2 Flow Rate (L/min) 2 L/min  Patient Activity (if Appropriate) In bed  Pulse Oximetry Type Continuous  Pain Assessment  Pain Scale 0-10  Pain Score 0  Height and Weight  Height 5\' 8"  (1.727 m)  Weight (!) 141.1 kg  Type of Scale Used Standing  Type of Weight Actual  BSA (Calculated - sq m) 2.6 sq meters  BMI (Calculated) 47.3  Weight in (lb) to have BMI = 25 164.1  MEWS Score  MEWS Temp 0  MEWS Systolic 0  MEWS Pulse 0  MEWS RR 0  MEWS LOC 0  MEWS Score 0   - Received pt. From ED via stretcher on O2 2L appears slightly tachypneic, pt. Alert, oriented x4 - Not in pain, with +2 swelling on BLE, VSS taken -Pt. Oriented to the unit, call bell w/in reach

## 2020-06-09 LAB — GLUCOSE, CAPILLARY
Glucose-Capillary: 111 mg/dL — ABNORMAL HIGH (ref 70–99)
Glucose-Capillary: 122 mg/dL — ABNORMAL HIGH (ref 70–99)
Glucose-Capillary: 138 mg/dL — ABNORMAL HIGH (ref 70–99)
Glucose-Capillary: 131 mg/dL — ABNORMAL HIGH (ref 70–99)

## 2020-06-09 LAB — BASIC METABOLIC PANEL
Anion gap: 9 (ref 5–15)
BUN: 16 mg/dL (ref 6–20)
CO2: 24 mmol/L (ref 22–32)
Calcium: 9 mg/dL (ref 8.9–10.3)
Chloride: 105 mmol/L (ref 98–111)
Creatinine, Ser: 1.27 mg/dL — ABNORMAL HIGH (ref 0.61–1.24)
GFR calc Af Amer: >60 mL/min (ref >60)
GFR calc non Af Amer: >60 mL/min (ref >60)
Glucose, Bld: 125 mg/dL — ABNORMAL HIGH (ref 70–99)
Potassium: 4.3 mmol/L (ref 3.5–5.1)
Sodium: 138 mmol/L (ref 135–145)

## 2020-06-09 LAB — BRAIN NATRIURETIC PEPTIDE: B Natriuretic Peptide: 690.3 pg/mL — ABNORMAL HIGH (ref 0.0–100.0)

## 2020-06-09 MED ORDER — FUROSEMIDE 10 MG/ML IJ SOLN
40.0000 mg | Freq: Once | INTRAMUSCULAR | Status: AC
  Administered 2020-06-09: 40 mg via INTRAVENOUS
  Filled 2020-06-09: qty 4

## 2020-06-09 NOTE — Progress Notes (Signed)
Subjective:  Reportedly required oxygen overnight due to sats in 80s. Breathing improved, but not back to baseline Cr increased to 1.27 today  Objective:  Vital Signs in the last 24 hours: Temp:  [98.1 F (36.7 C)-99.3 F (37.4 C)] 99.2 F (37.3 C) (07/22 0838) Pulse Rate:  [57-66] 59 (07/22 0838) Resp:  [18-20] 18 (07/22 0838) BP: (132-141)/(73-91) 132/73 (07/22 0838) SpO2:  [95 %-100 %] 100 % (07/22 0838) Weight:  [141.1 kg-141.4 kg] 141.4 kg (07/22 0135)  Intake/Output from previous day: 07/21 0701 - 07/22 0700 In: 340 [P.O.:240; IV Piggyback:100] Out: 1350 [Urine:1350]  Physical Exam Vitals and nursing note reviewed.  Constitutional:      General: He is not in acute distress.    Appearance: He is well-developed.  HENT:     Head: Normocephalic and atraumatic.  Eyes:     Conjunctiva/sclera: Conjunctivae normal.     Pupils: Pupils are equal, round, and reactive to light.  Neck:     Vascular: No JVD.  Cardiovascular:     Rate and Rhythm: Regular rhythm. Bradycardia present.     Pulses: Normal pulses and intact distal pulses.     Heart sounds: No murmur heard.   Pulmonary:     Effort: Pulmonary effort is normal.     Breath sounds: Decreased breath sounds present. No wheezing or rales.  Abdominal:     General: Bowel sounds are normal.     Palpations: Abdomen is soft.     Tenderness: There is no rebound.  Musculoskeletal:        General: No tenderness. Normal range of motion.     Left lower leg: No edema.  Lymphadenopathy:     Cervical: No cervical adenopathy.  Skin:    General: Skin is warm and dry.  Neurological:     Mental Status: He is alert and oriented to person, place, and time.     Cranial Nerves: No cranial nerve deficit.      Lab Results: BMP Recent Labs    06/08/20 0120 06/08/20 0120 06/08/20 0605 06/08/20 1646 06/09/20 0505  NA 142  --  142  --  138  K 2.1*   < > 2.6* 4.1 4.3  CL 122*  --  117*  --  105  CO2 14*  --  18*  --  24   GLUCOSE 79  --  126*  --  125*  BUN 10  --  12  --  16  CREATININE 0.64  --  0.86  --  1.27*  CALCIUM 5.3*  --  6.5*  --  9.0  GFRNONAA >60  --  >60  --  >60  GFRAA >60  --  >60  --  >60   < > = values in this interval not displayed.    CBC Recent Labs  Lab 06/07/20 1305  WBC 9.0  RBC 4.03*  HGB 11.0*  HCT 34.6*  PLT 139*  MCV 85.9  MCH 27.3  MCHC 31.8  RDW 14.3    HEMOGLOBIN A1C Lab Results  Component Value Date   HGBA1C 6.9 (H) 06/08/2020   MPG 151.33 06/08/2020     BNP (last 3 results) Recent Labs    06/07/20 2323 06/08/20 0427 06/09/20 0857  BNP 831.9* 996.6* 690.3*    TSH Recent Labs    04/28/20 1350  TSH 1.830    Lipid Panel     Component Value Date/Time   CHOL 214 (H) 02/16/2010 2016   TRIG 284 (H) 02/16/2010 2016  HDL 50 02/16/2010 2016   CHOLHDL 4.3 Ratio 02/16/2010 2016   VLDL 57 (H) 02/16/2010 2016   LDLCALC 107 (H) 02/16/2010 2016     Hepatic Function Panel Recent Labs    04/28/20 1350  PROT 7.0  ALBUMIN 4.5  AST 13  ALT 11  ALKPHOS 45*  BILITOT 0.8    CARDIAC STUDIES:  EKG 06/07/2020: Sinus rhythm First degree AV block Low voltage Nonspecific ST-T abnormality  Echocardiogram 05/05/2020:  Left ventricle cavity is normal in size. Moderate concentric hypertrophy  of the left ventricle. Mild global hypokinesis. LVEF 40-45%. Diastolic  function indeterminate. Elevated LAP. Calculated EF 25%.  Left atrial cavity is severely dilated.  Moderate (Grade II) mitral regurgitation.  Normal right atrial pressure.  Compared to previous study on 04/21/2019, pulmonary hypertension now absent   Echocardiogram 04/21/2019 : Left ventricle cavity is mildly dilated. Moderate concentric hypertrophy of the left ventricle. Hypokinetic global wall motion. Moderately depressed LV systolic function with EF 40%. Doppler evidence of grade III (restrictive) diastolic dysfunction, elevated LAP.  Left atrial cavity is severely dilated at 5.2  cm. Right atrial cavity is mildly dilated. Mild mitral valve leaflet thickening. Mild prolapse of the mitral valve leaflets. Moderate (Grade III) posteriorly directed mitral regurgitation. Mild tricuspid regurgitation. Moderate pulmonary hypertension. Estimated pulmonary artery systolic pressure is 10mm Hg with RA pressure estimated at 15 mm Hg. RVSP measures 51 mmHg. IVC is dilated with blunted respiratory response. compared to the study done on 08/10/2018, EF has improved from 20%.  EKG 09/24/2018: Sinus rhythm 80 bpm. First degree AV block. Left atrial enlargement. Incomplete RBBB.  R&LHC 08/12/2018: Left dominant circulation with no significant CAD Mildly elevated LVEDP (17 mmHg) Moderate WHO Grp II PH (Mean PA 33 mmHg) Mildly decompensated arrhythmia induced nonischemic cardiomyopathy. Continue guideline directed heart failure therapy, and management of Afib.  Assessment/Plan  54 year old African-American male with nonischemic cardiomyopathy, 07/2018, paroxysmal atrial fibrillation/flutter status post successful cardioversion 08/13/2018, not a candidate for ablation due severe LAA enlargement, hypertension, type II diabetes mellitus, morbid obesity, obstructive sleep apnea on CPAP, admitted with acute on chronic systolic heart failure  Acute on chronic systolic heart failure: EF 40-45% in 04/2020 Mild decompensation. Improving, but still requiring oxygen today. Will use lasix 40 mg bid today. Goal is maintaining sats >92% on ambulation without oxygen. Unable to use spironolactone due to h/o gynecomastia while on it.  ContinueEntresto to 97-103 mg bid, metoprolol succinate 100 mg daily.  Hypokalemia, hypomagnesemia: Now resolved.  AKI: Related to diuresis. He does not appear dehydrated. Continue diuresis  Paroxysmal Afib/flutter: CHA2DS2VASc score 3, annual stroke risk 3.6% Continue Xarelto 20 mg daily. Continue metoprolol succiante 100 mg daily, amiodarone 200 mg,  currently taking daily. Maintaining sinus rhythm. Continue management of OSA with CPAP, weight loss.  Hypertension: Well controlled  OSA: Severe, onCPAP.   Type II DM: Sliding scale insulin   Elder Negus, M.D. [facility] Pager: [phone] Office: [phone]

## 2020-06-09 NOTE — Plan of Care (Signed)

## 2020-06-09 NOTE — Progress Notes (Signed)
Ambulate patient in hallway mild SOB oxygen saturation consistent 94-97/RA with ambulation, at 97-100/ RA. Will continue to monitor the patient.

## 2020-06-10 LAB — BASIC METABOLIC PANEL
Anion gap: 8 (ref 5–15)
BUN: 16 mg/dL (ref 6–20)
CO2: 28 mmol/L (ref 22–32)
Calcium: 9.2 mg/dL (ref 8.9–10.3)
Chloride: 102 mmol/L (ref 98–111)
Creatinine, Ser: 1.29 mg/dL — ABNORMAL HIGH (ref 0.61–1.24)
GFR calc Af Amer: >60 mL/min (ref >60)
GFR calc non Af Amer: >60 mL/min (ref >60)
Glucose, Bld: 99 mg/dL (ref 70–99)
Potassium: 3.6 mmol/L (ref 3.5–5.1)
Sodium: 138 mmol/L (ref 135–145)

## 2020-06-10 LAB — GLUCOSE, CAPILLARY: Glucose-Capillary: 84 mg/dL (ref 70–99)

## 2020-06-10 MED ORDER — AMIODARONE HCL 200 MG PO TABS: 200.0000 mg | ORAL_TABLET | Freq: Every day | ORAL | 1 refills | Status: DC

## 2020-06-10 MED ORDER — MAGNESIUM OXIDE -MG SUPPLEMENT 200 MG PO TABS: 200.0000 mg | ORAL_TABLET | Freq: Two times a day (BID) | ORAL | 2 refills | Status: DC

## 2020-06-10 MED ORDER — POTASSIUM CHLORIDE ER 20 MEQ PO TBCR: 10.0000 meq | EXTENDED_RELEASE_TABLET | Freq: Every day | ORAL | 2 refills | Status: DC

## 2020-06-10 MED ORDER — FUROSEMIDE 40 MG PO TABS: 40.0000 mg | ORAL_TABLET | ORAL | 0 refills | Status: DC

## 2020-06-10 NOTE — Progress Notes (Signed)
D/C instructions given and reviewed. No questions voiced at this time. Tele, external urinary catheter and IVs removed. Tolerated well.

## 2020-06-10 NOTE — Discharge Summary (Signed)
Physician Discharge Summary  Patient ID: YASUO PHIMMASONE MRN: [id number] DOB/AGE: 56-Apr-1967 56 y.o.  Admit date: 06/07/2020 Discharge date: 06/10/2020  Primary Discharge Diagnosis: Acute hypoxic respiratory failure Acute on chronic systolic heart failure  Secondary Discharge Diagnosis: Hypertension Paroxysmal Afib Morbid obesity Type 2 DM OSA   Hospital Course:   54 year old African-American male with nonischemic cardiomyopathy, 07/2018, paroxysmal atrial fibrillation/flutter status post successful cardioversion 08/13/2018, not a candidate for ablation due severe LAA enlargement, hypertension, type II diabetes mellitus, morbid obesity, obstructive sleep apnea on CPAP.  Patient was supposed to see me last week for office follow up. He forgot about the appointment. Last couple days, he has had worsening shortness of breath, thus came to hospital on 7/20 night. Workup showed pulmonary edema, elevated BNP, severe hypokalemia. He has been taking lasix 40 mg twice daily. Of note, he has not tolerated spironolactone in the past due to gynecomastia.   During the hospitalization, he was diuresed with IV lasix with 8 lb wt loss, improvement in symptoms. He is now able to ambulate without hypoxia. Cr increased to 1.27 but stabilizing. K and Mg replaced. Will discharge on lasix 40 mg in am, additional 40 mg in pm as needed, Kdur 20 mEq daily, MgOx 200 mg bid.   Will check BMP next week. F/u appt arranged for transition of care. Will arrange remote patient monitoring for weight monitoring.    Discharge Exam: Blood pressure (!) 153/86, pulse 65, temperature 98.5 F (36.9 C), temperature source Oral, resp. rate 20, height 5\' 8"  (1.727 m), weight (!) 139.8 kg, SpO2 94 %.   Physical Exam Vitals and nursing note reviewed.  Constitutional:      General: He is not in acute distress.    Appearance: He is well-developed. He is obese.  HENT:     Head: Normocephalic and atraumatic.  Eyes:      Conjunctiva/sclera: Conjunctivae normal.     Pupils: Pupils are equal, round, and reactive to light.  Neck:     Vascular: No JVD.  Cardiovascular:     Rate and Rhythm: Regular rhythm. Bradycardia present.     Pulses: Normal pulses and intact distal pulses.     Heart sounds: No murmur heard.   Pulmonary:     Effort: Pulmonary effort is normal. No tachypnea.     Breath sounds: Normal breath sounds. No decreased breath sounds, wheezing or rales.  Abdominal:     General: Bowel sounds are normal.     Palpations: Abdomen is soft.     Tenderness: There is no rebound.  Musculoskeletal:        General: No tenderness. Normal range of motion.     Right lower leg: No edema.     Left lower leg: No edema.  Lymphadenopathy:     Cervical: No cervical adenopathy.  Skin:    General: Skin is warm and dry.  Neurological:     Mental Status: He is alert and oriented to person, place, and time.     Cranial Nerves: No cranial nerve deficit.      Recommendations on discharge:   Low salt diet Daily weight check   Significant Diagnostic Studies:  CARDIAC STUDIES:  EKG7/20/2021: Sinus rhythm First degree AV block Low voltage Nonspecific ST-T abnormality  Echocardiogram 05/05/2020:  Left ventricle cavity is normal in size. Moderate concentric hypertrophy  of the left ventricle. Mild global hypokinesis. LVEF 40-45%. Diastolic  function indeterminate. Elevated LAP. Calculated EF 25%.  Left atrial cavity is severely dilated.  Moderate (  Grade II) mitral regurgitation.  Normal right atrial pressure.  Compared to previous study on 04/21/2019, pulmonary hypertension now absent   Echocardiogram 04/21/2019 : Left ventricle cavity is mildly dilated. Moderate concentric hypertrophy of the left ventricle. Hypokinetic global wall motion. Moderately depressed LV systolic function with EF 40%. Doppler evidence of grade III (restrictive) diastolic dysfunction, elevated LAP.  Left atrial cavity is  severely dilated at 5.2 cm. Right atrial cavity is mildly dilated. Mild mitral valve leaflet thickening. Mild prolapse of the mitral valve leaflets. Moderate (Grade III) posteriorly directed mitral regurgitation. Mild tricuspid regurgitation. Moderate pulmonary hypertension. Estimated pulmonary artery systolic pressure is 52mm Hg with RA pressure estimated at 15 mm Hg. RVSP measures 51 mmHg. IVC is dilated with blunted respiratory response. compared to the study done on 08/10/2018, EF has improved from 20%.  EKG 09/24/2018: Sinus rhythm 80 bpm. First degree AV block. Left atrial enlargement. Incomplete RBBB.  R&LHC 08/12/2018: Left dominant circulation with no significant CAD Mildly elevated LVEDP (17 mmHg) Moderate WHO Grp II PH (Mean PA 33 mmHg) Mildly decompensated arrhythmia induced nonischemic cardiomyopathy. Continue guideline directed heart failure therapy, and management of Afib.   Labs:   Lab Results  Component Value Date   WBC 9.0 06/07/2020   HGB 11.0 (L) 06/07/2020   HCT 34.6 (L) 06/07/2020   MCV 85.9 06/07/2020   PLT 139 (L) 06/07/2020    Recent Labs  Lab 06/10/20 0652  NA 138  K 3.6  CL 102  CO2 28  BUN 16  CREATININE 1.29*  CALCIUM 9.2  GLUCOSE 99    Lipid Panel     Component Value Date/Time   CHOL 214 (H) 02/16/2010 2016   TRIG 284 (H) 02/16/2010 2016   HDL 50 02/16/2010 2016   CHOLHDL 4.3 Ratio 02/16/2010 2016   VLDL 57 (H) 02/16/2010 2016   LDLCALC 107 (H) 02/16/2010 2016    BNP (last 3 results) Recent Labs    06/07/20 2323 06/08/20 0427 06/09/20 0857  BNP 831.9* 996.6* 690.3*    HEMOGLOBIN A1C Lab Results  Component Value Date   HGBA1C 6.9 (H) 06/08/2020   MPG 151.33 06/08/2020    Cardiac Panel (last 3 results) No results for input(s): CKTOTAL, CKMB, TROPONINI, RELINDX in the last 8760 hours.  Lab Results  Component Value Date   CKTOTAL 269 (H) 08/19/2009   CKMB 2.8 08/19/2009   TROPONINI <0.30 04/25/2012      TSH Recent Labs    04/28/20 1350  TSH 1.830    Radiology: DG Chest 2 View  Result Date: 06/07/2020 CLINICAL DATA:  Cough, shortness of breath EXAM: CHEST - 2 VIEW COMPARISON:  08/10/2018 FINDINGS: Stable cardiomegaly. Pulmonary vascular congestion with diffuse bilateral interstitial opacities. Patchy alveolar opacities within the mid to lower lung zones bilaterally. No pleural effusion or pneumothorax. IMPRESSION: Findings of CHF with pulmonary edema. Patchy opacities within the mid to lower lung zones bilaterally likely reflect alveolar edema. Electronically Signed   By: Duanne Guess D.O.   On: 06/07/2020 13:20      FOLLOW UP PLANS AND APPOINTMENTS Discharge Instructions    (HEART FAILURE PATIENTS) Call MD:  Anytime you have any of the following symptoms: 1) 3 pound weight gain in 24 hours or 5 pounds in 1 week 2) shortness of breath, with or without a dry hacking cough 3) swelling in the hands, feet or stomach 4) if you have to sleep on extra pillows at night in order to breathe.   Complete by: As directed  Diet - low sodium heart healthy   Complete by: As directed    Increase activity slowly   Complete by: As directed      Allergies as of 06/10/2020   No Known Allergies     Medication List    STOP taking these medications   naproxen sodium 220 MG tablet Commonly known as: ALEVE     TAKE these medications   albuterol 108 (90 Base) MCG/ACT inhaler Commonly known as: VENTOLIN HFA Inhale 1 puff into the lungs every 6 (six) hours as needed for wheezing or shortness of breath.   allopurinol 300 MG tablet Commonly known as: ZYLOPRIM Take 300 mg by mouth continuous as needed.   amiodarone 200 MG tablet Commonly known as: PACERONE Take 1 tablet (200 mg total) by mouth daily. What changed: when to take this   diphenhydrAMINE 25 MG tablet Commonly known as: BENADRYL Take 25 mg by mouth daily as needed for itching or allergies.   Entresto 97-103 MG Generic drug:  sacubitril-valsartan TAKE 1 TABLET BY MOUTH TWICE A DAY   fluticasone 50 MCG/ACT nasal spray Commonly known as: FLONASE Place 1 spray into both nostrils daily as needed for allergies.   furosemide 40 MG tablet Commonly known as: LASIX Take 1 tablet (40 mg total) by mouth as directed. One tablet in morning, Additional one tablet in the afternoon for leg swelling, weight gain,as needed What changed:   when to take this  reasons to take this  additional instructions   loratadine 10 MG tablet Commonly known as: CLARITIN Take 10 mg by mouth daily as needed for allergies.   Magnesium Oxide 200 MG Tabs Take 1 tablet (200 mg total) by mouth in the morning and at bedtime.   metFORMIN 500 MG tablet Commonly known as: GLUCOPHAGE TAKE 1 TABLET BY MOUTH 2 TIMES DAILY WITH A MEAL. What changed: See the new instructions.   methocarbamol 750 MG tablet Commonly known as: ROBAXIN Take 750 mg by mouth every 8 (eight) hours as needed for muscle spasms.   metoprolol succinate 100 MG 24 hr tablet Commonly known as: TOPROL-XL Take 1 tablet (100 mg total) by mouth daily. Take with or immediately following a meal.   MULTIVITAMIN PO Take 1 tablet by mouth daily.   oxyCODONE-acetaminophen 10-325 MG tablet Commonly known as: PERCOCET Take 1 tablet by mouth every 4 (four) hours as needed for pain.   Potassium Chloride ER 20 MEQ Tbcr Take 10 mEq by mouth daily.   rivaroxaban 20 MG Tabs tablet Commonly known as: XARELTO Take 1 tablet (20 mg total) by mouth daily.   tadalafil 20 MG tablet Commonly known as: CIALIS Take 20 mg by mouth daily as needed for erectile dysfunction.   tamsulosin 0.4 MG Caps capsule Commonly known as: FLOMAX Take 0.4 mg by mouth 2 (two) times daily.       Follow-up Information    Elder Negus, MD Follow up on 06/21/2020.   Specialties: Cardiology, Radiology Why: 4 PM Contact information: [address] [place]  [address] [phone]                Truett Mainland MD, [facility] Pager: [phone] Office: [phone] If no answer: [phone]

## 2020-06-10 NOTE — Progress Notes (Signed)
Pt cbg resulted 15 at 0608, rechecked at 0610 was 84. Pt is alert and oriented x4 and no complaints.

## 2020-06-13 LAB — GLUCOSE, CAPILLARY: Glucose-Capillary: 15 mg/dL — CL (ref 70–99)

## 2020-06-18 DIAGNOSIS — I5022 Chronic systolic (congestive) heart failure: Secondary | ICD-10-CM | POA: Diagnosis not present

## 2020-06-20 ENCOUNTER — Other Ambulatory Visit: Payer: Self-pay

## 2020-06-20 ENCOUNTER — Encounter: Payer: Self-pay | Admitting: Cardiology

## 2020-06-20 ENCOUNTER — Ambulatory Visit: Payer: Medicare HMO | Admitting: Cardiology

## 2020-06-20 VITALS — BP 149/83 | HR 69 | Resp 17 | Ht 68.0 in | Wt 295.0 lb

## 2020-06-20 DIAGNOSIS — I5022 Chronic systolic (congestive) heart failure: Secondary | ICD-10-CM

## 2020-06-20 DIAGNOSIS — I480 Paroxysmal atrial fibrillation: Secondary | ICD-10-CM

## 2020-06-20 DIAGNOSIS — I10 Essential (primary) hypertension: Secondary | ICD-10-CM | POA: Diagnosis not present

## 2020-06-20 NOTE — Progress Notes (Signed)
Patient is here for follow up visit.  Subjective:   Juan Snyder, male    DOB: 17-Oct-1966, 54 y.o.   MRN: [id number]    Chief Complaint  Patient presents with  . Congestive Heart Failure  . Shortness of Breath  . Hospitalization Follow-up    HPI  54 year old African-American male with nonischemic cardiomyopathy, 07/2018, paroxysmal atrial fibrillation/flutter status post successful cardioversion 08/13/2018, not a candidate for ablation due severe LAA enlargement, hypertension, type II diabetes mellitus, morbid obesity, obstructive sleep apnea on CPAP.  Patient was hospitalized July 2021 with acute on chronic systolic heart failure. Workup showed pulmonary edema, elevated BNP, severe hypokalemia. He has been taking lasix 40 mg twice daily. Of note, he has not tolerated spironolactone in the past due to gynecomastia.  During the hospitalization, he was diuresed with IV lasix with 8 lb wt loss, improvement in symptoms. He is now able to ambulate without hypoxia. Cr increased to 1.27 but stabilizing. K and Mg replaced. Will discharge on lasix 40 mg in am, additional 40 mg in pm as needed, Kdur 20 mEq daily, MgOx 200 mg bid.   Patient is here for transitional care visit.  Patient's exertional dyspnea has improved well.  2 weeks since his hospital discharge.  Leg edema is completely resolved.  He is compliant with his medical therapy.  Current Outpatient Medications on File Prior to Visit  Medication Sig Dispense Refill  . albuterol (PROVENTIL HFA;VENTOLIN HFA) 108 (90 Base) MCG/ACT inhaler Inhale 1 puff into the lungs every 6 (six) hours as needed for wheezing or shortness of breath.    . allopurinol (ZYLOPRIM) 300 MG tablet Take 300 mg by mouth continuous as needed.     Marland Kitchen amiodarone (PACERONE) 200 MG tablet Take 1 tablet (200 mg total) by mouth daily. 90 tablet 1  . diphenhydrAMINE (BENADRYL) 25 MG tablet Take 25 mg by mouth daily as needed for itching or allergies.    Marland Kitchen  ENTRESTO 97-103 MG TAKE 1 TABLET BY MOUTH TWICE A DAY (Patient taking differently: Take 1 tablet by mouth 2 (two) times daily. ) 60 tablet 3  . fluticasone (FLONASE) 50 MCG/ACT nasal spray Place 1 spray into both nostrils daily as needed for allergies.     . furosemide (LASIX) 40 MG tablet Take 1 tablet (40 mg total) by mouth as directed. One tablet in morning, Additional one tablet in the afternoon for leg swelling, weight gain,as needed 60 tablet 0  . loratadine (CLARITIN) 10 MG tablet Take 10 mg by mouth daily as needed for allergies.    . Magnesium Oxide 200 MG TABS Take 1 tablet (200 mg total) by mouth in the morning and at bedtime. 60 tablet 2  . metFORMIN (GLUCOPHAGE) 500 MG tablet TAKE 1 TABLET BY MOUTH 2 TIMES DAILY WITH A MEAL. (Patient taking differently: Take 500 mg by mouth 2 (two) times daily with a meal. ) 180 tablet 1  . methocarbamol (ROBAXIN) 750 MG tablet Take 750 mg by mouth every 8 (eight) hours as needed for muscle spasms.    . metoprolol succinate (TOPROL-XL) 100 MG 24 hr tablet Take 1 tablet (100 mg total) by mouth daily. Take with or immediately following a meal. 30 tablet 0  . Multiple Vitamins-Minerals (MULTIVITAMIN PO) Take 1 tablet by mouth daily.    Marland Kitchen oxyCODONE-acetaminophen (PERCOCET) 10-325 MG tablet Take 1 tablet by mouth every 4 (four) hours as needed for pain.    . potassium chloride 20 MEQ TBCR Take 10 mEq  by mouth daily. 60 tablet 2  . rivaroxaban (XARELTO) 20 MG TABS tablet Take 1 tablet (20 mg total) by mouth daily. 90 tablet 3  . tadalafil (CIALIS) 20 MG tablet Take 20 mg by mouth daily as needed for erectile dysfunction.     . tamsulosin (FLOMAX) 0.4 MG CAPS capsule Take 0.4 mg by mouth 2 (two) times daily.      No current facility-administered medications on file prior to visit.    Recent labs: 06/10/2020: Glucose 99, BUN/Cr 16/1.29. EGFR >60. Na/K 138/3.6.  H/H 11/34. MCV 85. Platelets 139  Results for Juan Snyder, Juan Snyder (MRN [id number]) as of 06/20/2020  12:49  Ref. Range 06/07/2020 23:23 06/08/2020 04:27 06/09/2020 08:57  B Natriuretic Peptide Latest Ref Range: 0.0 - 100.0 pg/mL 831.9 (H) 996.6 (H) 690.3 (H)    08/2019-01/2020: BUN/Cr 14/1.1. EGFR 73 HbA1C 6.9% Chol 156, TG 215, HDL 52, LDL 88 TSH 1.7 normal (12/2018)   08/14/2018: Glucose 204, BUN/Cr 20/1.08. EGFR >60. Na/K 137/4.5.  H/H 10.6/32.7. MCV 83. Platelets 257 HbA1C 7.5% TSH 1.2 normal  Cardiovascular studies:  EKG 01/27/2020: Sinus rhythm 53 bpm.  First degree AV block. Nonspecific T-abnormality.   Echocardiogram 04/21/2019 :  Left ventricle cavity is mildly dilated. Moderate concentric hypertrophy of the left ventricle. Hypokinetic global wall motion. Moderately depressed LV systolic function with EF 40%. Doppler evidence of grade III (restrictive) diastolic dysfunction, elevated LAP.  Left atrial cavity is severely dilated at 5.2 cm. Right atrial cavity is mildly dilated. Mild mitral valve leaflet thickening. Mild prolapse of the mitral valve leaflets. Moderate (Grade III) posteriorly directed mitral regurgitation. Mild tricuspid regurgitation. Moderate pulmonary hypertension. Estimated pulmonary artery systolic pressure is 54YO Hg with RA pressure estimated at 15 mm Hg. RVSP measures 51 mmHg. IVC is dilated with blunted respiratory response. compared to the study done on 08/10/2018, EF has improved from 20%.  EKG 09/24/2018: Sinus rhythm 80 bpm. First degree AV block. Left atrial enlargement. Incomplete RBBB.  R&LHC 08/12/2018: Left dominant circulation with no significant CAD Mildly elevated LVEDP (17 mmHg) Moderate WHO Grp II PH (Mean PA 33 mmHg) Mildly decompensated arrhythmia induced nonischemic cardiomyopathy. Continue guideline directed heart failure therapy, and management of Afib.  Review of Systems  Constitutional:       Bilateral breast tenderness  Cardiovascular: Negative for chest pain, dyspnea on exertion, leg swelling, palpitations and syncope.   Gastrointestinal: Positive for melena.       Objective:    Vitals:   06/20/20 1454  BP: (!) 149/83  Pulse: 69  Resp: 17  SpO2: 95%       Physical Exam Vitals and nursing note reviewed.  Constitutional:      Appearance: He is well-developed.  Neck:     Vascular: No JVD.  Cardiovascular:     Rate and Rhythm: Normal rate and regular rhythm.     Pulses: Intact distal pulses.     Heart sounds: Normal heart sounds. No murmur heard.   Pulmonary:     Effort: Pulmonary effort is normal.     Breath sounds: Normal breath sounds. No wheezing or rales.        Assessment & Recommendations:   54 year old African-American male with nonischemic cardiomyopathy, 07/2018, paroxysmal atrial fibrillation/flutter, hypertension, type II diabetes mellitus, morbid obesity, obstructive sleep apnea on CPAP.  Nonscehmic cardiomyopathy: Chronic systolic heart failure. 13 lb weight loss since hospital discharge in July 2021. Recent acute decompensation requiring hospitalization in 05/2020. EF remains 40-45%. Currently on Entresto to 97-103 mg bid,  metoprolol succinate 100 mg daily, Lasix 40 mg in the a.m., additional 40 mg p.o. as needed.   Check CMP, magnesium, proBNP today. Continue regular weight checks through the patient  Paroxysmal Afib/flutter: CHA2DS2VASc score 3, annual stroke risk 3.6% Continue Xarelto 20 mg daily. Continue metoprolol succiante 100 mg daily, amiodarone 200 mg bid. Maintianing sinus rhythm. Successful cardioversion 08/13/2018, not a candidate for ablation due severe LAA enlargement Continue management of OSA with CPAP, weight loss.  Hypertension: Mildly elevated today.  No change made.  Will follow with office visit in 4 weeks.  OSA: Severe, on CPAP.   Type II DM: Managed by PCP.  Check BMP, pro BNP today.  F/u in 4 weeks  Kermit, MD [facility] Pager: [phone] Office: [phone] If no answer Cell [phone]

## 2020-06-21 ENCOUNTER — Ambulatory Visit: Payer: Medicare HMO | Admitting: Cardiology

## 2020-06-23 DIAGNOSIS — I5042 Chronic combined systolic (congestive) and diastolic (congestive) heart failure: Secondary | ICD-10-CM | POA: Diagnosis not present

## 2020-06-23 DIAGNOSIS — E1165 Type 2 diabetes mellitus with hyperglycemia: Secondary | ICD-10-CM | POA: Diagnosis not present

## 2020-06-23 DIAGNOSIS — I119 Hypertensive heart disease without heart failure: Secondary | ICD-10-CM | POA: Diagnosis not present

## 2020-06-23 DIAGNOSIS — I10 Essential (primary) hypertension: Secondary | ICD-10-CM | POA: Diagnosis not present

## 2020-06-23 DIAGNOSIS — E782 Mixed hyperlipidemia: Secondary | ICD-10-CM | POA: Diagnosis not present

## 2020-07-10 ENCOUNTER — Other Ambulatory Visit: Payer: Self-pay | Admitting: Cardiology

## 2020-07-16 ENCOUNTER — Other Ambulatory Visit: Payer: Self-pay | Admitting: Cardiology

## 2020-07-17 NOTE — Progress Notes (Addendum)
Patient is here for follow up visit.  Subjective:   Juan Snyder, male    DOB: 1966/09/27, 54 y.o.   MRN: [id number]    Chief Complaint  Patient presents with  . Congestive Heart Failure  . Hypertension  . Follow-up    4 week     HPI  55 year old African-American male with nonischemic cardiomyopathy, 07/2018, paroxysmal atrial fibrillation/flutter status post successful cardioversion 08/13/2018, not a candidate for ablation due severe LAA enlargement, hypertension, type II diabetes mellitus, morbid obesity, obstructive sleep apnea on CPAP.  Patient was hospitalized July 2021 with acute on chronic systolic heart failure. Workup showed pulmonary edema, elevated BNP, severe hypokalemia. He has been taking lasix 40 mg twice daily. Of note, he has not tolerated spironolactone in the past due to gynecomastia.  During the hospitalization, he was diuresed with IV lasix with 8 lb wt loss, improvement in symptoms. He is now able to ambulate without hypoxia. Cr increased to 1.27 but stabilizing. K and Mg replaced. Will discharge on lasix 40 mg in am, additional 40 mg in pm as needed, Kdur 20 mEq daily, MgOx 200 mg bid.   Patient continues to have exertional dyspnea. Leg edema is stable. Wt is up to 300.6 lb, up from 295.6 lb. He is having cost issues with Entresto.  Current Outpatient Medications on File Prior to Visit  Medication Sig Dispense Refill  . albuterol (PROVENTIL HFA;VENTOLIN HFA) 108 (90 Base) MCG/ACT inhaler Inhale 1 puff into the lungs every 6 (six) hours as needed for wheezing or shortness of breath.    . allopurinol (ZYLOPRIM) 300 MG tablet Take 300 mg by mouth continuous as needed.  (Patient not taking: Reported on 06/20/2020)    . amiodarone (PACERONE) 200 MG tablet Take 1 tablet (200 mg total) by mouth daily. (Patient taking differently: Take 200 mg by mouth 2 (two) times daily. ) 90 tablet 1  . diphenhydrAMINE (BENADRYL) 25 MG tablet Take 25 mg by mouth daily as  needed for itching or allergies. (Patient not taking: Reported on 06/20/2020)    . ENTRESTO 97-103 MG TAKE 1 TABLET BY MOUTH TWICE A DAY (Patient taking differently: Take 1 tablet by mouth 2 (two) times daily. ) 60 tablet 3  . fluticasone (FLONASE) 50 MCG/ACT nasal spray Place 1 spray into both nostrils daily as needed for allergies.     . furosemide (LASIX) 40 MG tablet Take 1 tablet (40 mg total) by mouth as directed. One tablet in morning, Additional one tablet in the afternoon for leg swelling, weight gain,as needed 60 tablet 0  . loratadine (CLARITIN) 10 MG tablet Take 10 mg by mouth daily as needed for allergies.    . Magnesium Oxide 200 MG TABS Take 1 tablet (200 mg total) by mouth in the morning and at bedtime. (Patient not taking: Reported on 06/20/2020) 60 tablet 2  . metFORMIN (GLUCOPHAGE) 500 MG tablet TAKE 1 TABLET BY MOUTH 2 TIMES DAILY WITH A MEAL. (Patient taking differently: Take 500 mg by mouth 2 (two) times daily with a meal. ) 180 tablet 1  . methocarbamol (ROBAXIN) 750 MG tablet Take 750 mg by mouth every 8 (eight) hours as needed for muscle spasms.    . metoprolol succinate (TOPROL-XL) 100 MG 24 hr tablet Take 1 tablet (100 mg total) by mouth daily. Take with or immediately following a meal. 30 tablet 0  . Multiple Vitamins-Minerals (MULTIVITAMIN PO) Take 1 tablet by mouth daily.    Marland Kitchen oxyCODONE-acetaminophen (PERCOCET) 10-325 MG  tablet Take 1 tablet by mouth every 4 (four) hours as needed for pain.    . potassium chloride 20 MEQ TBCR Take 10 mEq by mouth daily. 60 tablet 2  . tadalafil (CIALIS) 20 MG tablet Take 20 mg by mouth daily as needed for erectile dysfunction.  (Patient not taking: Reported on 06/20/2020)    . tamsulosin (FLOMAX) 0.4 MG CAPS capsule Take 0.4 mg by mouth 2 (two) times daily.     Alveda Reasons 20 MG TABS tablet TAKE 1 TABLET BY MOUTH EVERY DAY 90 tablet 3   No current facility-administered medications on file prior to visit.    Recent labs: 06/10/2020: Glucose  99, BUN/Cr 16/1.29. EGFR >60. Na/K 138/3.6.  H/H 11/34. MCV 85. Platelets 139  Results for Juan, Snyder (MRN [id number]) as of 06/20/2020 12:49  Ref. Range 06/07/2020 23:23 06/08/2020 04:27 06/09/2020 08:57  B Natriuretic Peptide Latest Ref Range: 0.0 - 100.0 pg/mL 831.9 (H) 996.6 (H) 690.3 (H)    08/2019-01/2020: BUN/Cr 14/1.1. EGFR 73 HbA1C 6.9% Chol 156, TG 215, HDL 52, LDL 88 TSH 1.7 normal (12/2018)   08/14/2018: Glucose 204, BUN/Cr 20/1.08. EGFR >60. Na/K 137/4.5.  H/H 10.6/32.7. MCV 83. Platelets 257 HbA1C 7.5% TSH 1.2 normal  Cardiovascular studies:  EKG 01/27/2020: Sinus rhythm 53 bpm.  First degree AV block. Nonspecific T-abnormality.   Echocardiogram 04/21/2019 :  Left ventricle cavity is mildly dilated. Moderate concentric hypertrophy of the left ventricle. Hypokinetic global wall motion. Moderately depressed LV systolic function with EF 40%. Doppler evidence of grade III (restrictive) diastolic dysfunction, elevated LAP.  Left atrial cavity is severely dilated at 5.2 cm. Right atrial cavity is mildly dilated. Mild mitral valve leaflet thickening. Mild prolapse of the mitral valve leaflets. Moderate (Grade III) posteriorly directed mitral regurgitation. Mild tricuspid regurgitation. Moderate pulmonary hypertension. Estimated pulmonary artery systolic pressure is 94HW Hg with RA pressure estimated at 15 mm Hg. RVSP measures 51 mmHg. IVC is dilated with blunted respiratory response. compared to the study done on 08/10/2018, EF has improved from 20%.  EKG 09/24/2018: Sinus rhythm 80 bpm. First degree AV block. Left atrial enlargement. Incomplete RBBB.  R&LHC 08/12/2018: Left dominant circulation with no significant CAD Mildly elevated LVEDP (17 mmHg) Moderate WHO Grp II PH (Mean PA 33 mmHg) Mildly decompensated arrhythmia induced nonischemic cardiomyopathy. Continue guideline directed heart failure therapy, and management of Afib.  Recent labs: 06/10/2020: Glucose  99, BUN/Cr 16/1.29. EGFR >60. Na/K 138/3.6.  H/H 11/34. MCV 85. Platelets 129  Review of Systems  Constitutional:       Bilateral breast tenderness  Cardiovascular: Negative for chest pain, dyspnea on exertion, leg swelling, palpitations and syncope.  Gastrointestinal: Positive for melena.       Objective:    Vitals:   07/18/20 1144  BP: (!) 144/82  Pulse: 67  Resp: 16  SpO2: 96%       Physical Exam Vitals and nursing note reviewed.  Constitutional:      Appearance: He is well-developed.  Neck:     Vascular: No JVD.  Cardiovascular:     Rate and Rhythm: Normal rate and regular rhythm.     Pulses: Intact distal pulses.     Heart sounds: Normal heart sounds. No murmur heard.   Pulmonary:     Effort: Pulmonary effort is normal.     Breath sounds: Normal breath sounds. No wheezing or rales.        Assessment & Recommendations:   54 year old African-American male with nonischemic cardiomyopathy, 07/2018, paroxysmal  atrial fibrillation/flutter, hypertension, type II diabetes mellitus, morbid obesity, obstructive sleep apnea on CPAP.  Nonscehmic cardiomyopathy: Chronic systolic heart failure. 13 lb weight loss since hospital discharge in July 2021. Recent acute decompensation requiring hospitalization in 05/2020. EF remains 40-45%. Currently on Entresto to 97-103 mg bid,  metoprolol succinate 100 mg daily. Increase Lasix to 40 mg bid. Check BMP, pro BNP now, and repeat in 4 weeks If renal function would allow, will add [place].  Paroxysmal Afib/flutter: CHA2DS2VASc score 3, annual stroke risk 3.6% Continue Xarelto 20 mg daily. Continue metoprolol succiante 100 mg daily, amiodarone 200 mg bid. Maintianing sinus rhythm. Successful cardioversion 08/13/2018, not a candidate for ablation due severe LAA enlargement Continue management of OSA with CPAP, weight loss.  Hypertension: Mildly elevated today.  Hopefully increased lasix dose may help.  OSA: Severe, on  CPAP.   Type II DM: Managed by PCP.  Check BMP, pro BNP today.  F/u in 4 weeks  [place], MD [facility] [place] Pager: [phone] Office: [phone] If no answer Cell [phone]

## 2020-07-18 ENCOUNTER — Encounter: Payer: Self-pay | Admitting: Cardiology

## 2020-07-18 ENCOUNTER — Other Ambulatory Visit: Payer: Self-pay

## 2020-07-18 ENCOUNTER — Ambulatory Visit: Payer: Medicare HMO | Admitting: Cardiology

## 2020-07-18 VITALS — BP 144/82 | HR 67 | Resp 16 | Ht 68.0 in | Wt 303.0 lb

## 2020-07-18 DIAGNOSIS — I10 Essential (primary) hypertension: Secondary | ICD-10-CM | POA: Diagnosis not present

## 2020-07-18 DIAGNOSIS — I480 Paroxysmal atrial fibrillation: Secondary | ICD-10-CM

## 2020-07-18 DIAGNOSIS — I5022 Chronic systolic (congestive) heart failure: Secondary | ICD-10-CM | POA: Diagnosis not present

## 2020-07-18 MED ORDER — FUROSEMIDE 40 MG PO TABS: 40.0000 mg | ORAL_TABLET | Freq: Two times a day (BID) | ORAL | 1 refills | Status: AC

## 2020-07-19 DIAGNOSIS — I5022 Chronic systolic (congestive) heart failure: Secondary | ICD-10-CM | POA: Diagnosis not present

## 2020-07-19 LAB — BASIC METABOLIC PANEL
BUN/Creatinine Ratio: 13 (ref 9–20)
BUN: 17 mg/dL (ref 6–24)
CO2: 22 mmol/L (ref 20–29)
Calcium: 9.4 mg/dL (ref 8.7–10.2)
Chloride: 105 mmol/L (ref 96–106)
Creatinine, Ser: 1.29 mg/dL — ABNORMAL HIGH (ref 0.76–1.27)
GFR calc Af Amer: 72 mL/min/{1.73_m2} (ref >59)
GFR calc non Af Amer: 62 mL/min/{1.73_m2} (ref >59)
Glucose: 121 mg/dL — ABNORMAL HIGH (ref 65–99)
Potassium: 4.3 mmol/L (ref 3.5–5.2)
Sodium: 140 mmol/L (ref 134–144)

## 2020-07-19 LAB — PRO B NATRIURETIC PEPTIDE: NT-Pro BNP: 776 pg/mL — ABNORMAL HIGH (ref 0–210)

## 2020-07-29 DIAGNOSIS — Z0100 Encounter for examination of eyes and vision without abnormal findings: Secondary | ICD-10-CM | POA: Diagnosis not present

## 2020-08-04 DIAGNOSIS — E1165 Type 2 diabetes mellitus with hyperglycemia: Secondary | ICD-10-CM | POA: Diagnosis not present

## 2020-08-04 DIAGNOSIS — I10 Essential (primary) hypertension: Secondary | ICD-10-CM | POA: Diagnosis not present

## 2020-08-04 DIAGNOSIS — E782 Mixed hyperlipidemia: Secondary | ICD-10-CM | POA: Diagnosis not present

## 2020-08-04 DIAGNOSIS — I5042 Chronic combined systolic (congestive) and diastolic (congestive) heart failure: Secondary | ICD-10-CM | POA: Diagnosis not present

## 2020-08-04 DIAGNOSIS — I119 Hypertensive heart disease without heart failure: Secondary | ICD-10-CM | POA: Diagnosis not present

## 2020-08-18 DIAGNOSIS — I5022 Chronic systolic (congestive) heart failure: Secondary | ICD-10-CM | POA: Diagnosis not present

## 2020-08-23 NOTE — Progress Notes (Signed)
No show

## 2020-08-24 ENCOUNTER — Ambulatory Visit: Payer: Medicare HMO | Admitting: Cardiology

## 2020-09-05 ENCOUNTER — Telehealth: Payer: Self-pay

## 2020-09-05 DIAGNOSIS — I5042 Chronic combined systolic (congestive) and diastolic (congestive) heart failure: Secondary | ICD-10-CM | POA: Diagnosis not present

## 2020-09-05 DIAGNOSIS — E1165 Type 2 diabetes mellitus with hyperglycemia: Secondary | ICD-10-CM | POA: Diagnosis not present

## 2020-09-05 DIAGNOSIS — I10 Essential (primary) hypertension: Secondary | ICD-10-CM | POA: Diagnosis not present

## 2020-09-05 DIAGNOSIS — E782 Mixed hyperlipidemia: Secondary | ICD-10-CM | POA: Diagnosis not present

## 2020-09-05 DIAGNOSIS — J189 Pneumonia, unspecified organism: Secondary | ICD-10-CM | POA: Diagnosis not present

## 2020-09-05 DIAGNOSIS — I119 Hypertensive heart disease without heart failure: Secondary | ICD-10-CM | POA: Diagnosis not present

## 2020-09-05 DIAGNOSIS — Z20828 Contact with and (suspected) exposure to other viral communicable diseases: Secondary | ICD-10-CM | POA: Diagnosis not present

## 2020-09-05 DIAGNOSIS — Z03818 Encounter for observation for suspected exposure to other biological agents ruled out: Secondary | ICD-10-CM | POA: Diagnosis not present

## 2020-09-05 NOTE — Telephone Encounter (Signed)
May be best to check with PCP. Also, I dont see any f/u for him. I think he missed his last f/u w/me. 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  Thanks MJP

## 2020-09-05 NOTE — Telephone Encounter (Signed)
Pt called asking if you could prescribe him something because he has a cough.

## 2020-09-05 NOTE — Telephone Encounter (Signed)
Pt cx'd 08/24/2020 appt. I called today and LMOM for him to calll and r/s appt

## 2020-09-08 ENCOUNTER — Emergency Department (HOSPITAL_COMMUNITY)
Admission: EM | Admit: 2020-09-08 | Discharge: 2020-09-09 | Disposition: A | Discharge status: Home / Self Care | Payer: Medicare HMO | Attending: Emergency Medicine | Admitting: Emergency Medicine

## 2020-09-08 ENCOUNTER — Other Ambulatory Visit: Payer: Self-pay

## 2020-09-08 ENCOUNTER — Encounter (HOSPITAL_COMMUNITY): Payer: Self-pay

## 2020-09-08 ENCOUNTER — Emergency Department (HOSPITAL_COMMUNITY): Payer: Medicare HMO

## 2020-09-08 DIAGNOSIS — J40 Bronchitis, not specified as acute or chronic: Secondary | ICD-10-CM | POA: Insufficient documentation

## 2020-09-08 DIAGNOSIS — Z7984 Long term (current) use of oral hypoglycemic drugs: Secondary | ICD-10-CM | POA: Insufficient documentation

## 2020-09-08 DIAGNOSIS — Z955 Presence of coronary angioplasty implant and graft: Secondary | ICD-10-CM | POA: Insufficient documentation

## 2020-09-08 DIAGNOSIS — I509 Heart failure, unspecified: Secondary | ICD-10-CM

## 2020-09-08 DIAGNOSIS — R0602 Shortness of breath: Secondary | ICD-10-CM | POA: Diagnosis not present

## 2020-09-08 DIAGNOSIS — Z7901 Long term (current) use of anticoagulants: Secondary | ICD-10-CM | POA: Insufficient documentation

## 2020-09-08 DIAGNOSIS — E119 Type 2 diabetes mellitus without complications: Secondary | ICD-10-CM | POA: Diagnosis not present

## 2020-09-08 DIAGNOSIS — I110 Hypertensive heart disease with heart failure: Secondary | ICD-10-CM | POA: Insufficient documentation

## 2020-09-08 DIAGNOSIS — Z79899 Other long term (current) drug therapy: Secondary | ICD-10-CM | POA: Insufficient documentation

## 2020-09-08 DIAGNOSIS — I517 Cardiomegaly: Secondary | ICD-10-CM | POA: Diagnosis not present

## 2020-09-08 DIAGNOSIS — R059 Cough, unspecified: Secondary | ICD-10-CM | POA: Diagnosis not present

## 2020-09-08 DIAGNOSIS — R197 Diarrhea, unspecified: Secondary | ICD-10-CM | POA: Diagnosis not present

## 2020-09-08 DIAGNOSIS — J811 Chronic pulmonary edema: Secondary | ICD-10-CM | POA: Diagnosis not present

## 2020-09-08 DIAGNOSIS — Z20822 Contact with and (suspected) exposure to covid-19: Secondary | ICD-10-CM | POA: Diagnosis not present

## 2020-09-08 DIAGNOSIS — I5033 Acute on chronic diastolic (congestive) heart failure: Secondary | ICD-10-CM | POA: Insufficient documentation

## 2020-09-08 LAB — URINALYSIS, ROUTINE W REFLEX MICROSCOPIC
Bacteria, UA: NONE SEEN
Bilirubin Urine: NEGATIVE
Glucose, UA: NEGATIVE mg/dL
Hgb urine dipstick: NEGATIVE
Ketones, ur: NEGATIVE mg/dL
Leukocytes,Ua: NEGATIVE
Nitrite: NEGATIVE
Protein, ur: 100 mg/dL — AB
Specific Gravity, Urine: 1.015 (ref 1.005–1.030)
pH: 6 (ref 5.0–8.0)

## 2020-09-08 LAB — CBC WITH DIFFERENTIAL/PLATELET
Abs Immature Granulocytes: 0.04 10*3/uL (ref 0.00–0.07)
Basophils Absolute: 0.1 10*3/uL (ref 0.0–0.1)
Basophils Relative: 1 %
Eosinophils Absolute: 0.1 10*3/uL (ref 0.0–0.5)
Eosinophils Relative: 1 %
HCT: 38.5 % — ABNORMAL LOW (ref 39.0–52.0)
Hemoglobin: 13.1 g/dL (ref 13.0–17.0)
Immature Granulocytes: 0 %
Lymphocytes Relative: 10 %
Lymphs Abs: 1.1 10*3/uL (ref 0.7–4.0)
MCH: 28.6 pg (ref 26.0–34.0)
MCHC: 34 g/dL (ref 30.0–36.0)
MCV: 84.1 fL (ref 80.0–100.0)
Monocytes Absolute: 0.8 10*3/uL (ref 0.1–1.0)
Monocytes Relative: 7 %
Neutro Abs: 8.9 10*3/uL — ABNORMAL HIGH (ref 1.7–7.7)
Neutrophils Relative %: 81 %
Platelets: 235 10*3/uL (ref 150–400)
RBC: 4.58 MIL/uL (ref 4.22–5.81)
RDW: 14.8 % (ref 11.5–15.5)
WBC: 11 10*3/uL — ABNORMAL HIGH (ref 4.0–10.5)
nRBC: 0 % (ref 0.0–0.2)

## 2020-09-08 LAB — COMPREHENSIVE METABOLIC PANEL
ALT: 17 U/L (ref 0–44)
AST: 16 U/L (ref 15–41)
Albumin: 4.4 g/dL (ref 3.5–5.0)
Alkaline Phosphatase: 45 U/L (ref 38–126)
Anion gap: 14 (ref 5–15)
BUN: 17 mg/dL (ref 6–20)
CO2: 26 mmol/L (ref 22–32)
Calcium: 9.8 mg/dL (ref 8.9–10.3)
Chloride: 101 mmol/L (ref 98–111)
Creatinine, Ser: 1.1 mg/dL (ref 0.61–1.24)
GFR, Estimated: >60 mL/min (ref >60)
Glucose, Bld: 149 mg/dL — ABNORMAL HIGH (ref 70–99)
Potassium: 3.9 mmol/L (ref 3.5–5.1)
Sodium: 141 mmol/L (ref 135–145)
Total Bilirubin: 1.9 mg/dL — ABNORMAL HIGH (ref 0.3–1.2)
Total Protein: 8.6 g/dL — ABNORMAL HIGH (ref 6.5–8.1)

## 2020-09-08 LAB — BLOOD GAS, VENOUS
Acid-Base Excess: 2.4 mmol/L — ABNORMAL HIGH (ref 0.0–2.0)
Bicarbonate: 27.9 mmol/L (ref 20.0–28.0)
O2 Saturation: 77.1 %
Patient temperature: 98.6
pCO2, Ven: 49.6 mmHg (ref 44.0–60.0)
pH, Ven: 7.369 (ref 7.250–7.430)
pO2, Ven: 48.5 mmHg — ABNORMAL HIGH (ref 32.0–45.0)

## 2020-09-08 LAB — PHOSPHORUS: Phosphorus: 3.4 mg/dL (ref 2.5–4.6)

## 2020-09-08 LAB — TROPONIN I (HIGH SENSITIVITY)
Troponin I (High Sensitivity): 18 ng/L — ABNORMAL HIGH (ref <18)
Troponin I (High Sensitivity): 22 ng/L — ABNORMAL HIGH (ref <18)

## 2020-09-08 LAB — MAGNESIUM: Magnesium: 2.5 mg/dL — ABNORMAL HIGH (ref 1.7–2.4)

## 2020-09-08 LAB — PROTIME-INR
INR: 1.6 — ABNORMAL HIGH (ref 0.8–1.2)
Prothrombin Time: 18 seconds — ABNORMAL HIGH (ref 11.4–15.2)

## 2020-09-08 LAB — RESPIRATORY PANEL BY RT PCR (FLU A&B, COVID)
Influenza A by PCR: NEGATIVE
Influenza B by PCR: NEGATIVE
SARS Coronavirus 2 by RT PCR: NEGATIVE

## 2020-09-08 LAB — BRAIN NATRIURETIC PEPTIDE: B Natriuretic Peptide: 963.9 pg/mL — ABNORMAL HIGH (ref 0.0–100.0)

## 2020-09-08 LAB — LACTIC ACID, PLASMA: Lactic Acid, Venous: 1.7 mmol/L (ref 0.5–1.9)

## 2020-09-08 MED ORDER — LABETALOL HCL 5 MG/ML IV SOLN
10.0000 mg | Freq: Once | INTRAVENOUS | Status: AC
  Administered 2020-09-08: 10 mg via INTRAVENOUS
  Filled 2020-09-08: qty 4

## 2020-09-08 MED ORDER — HYDROCODONE-HOMATROPINE 5-1.5 MG/5ML PO SYRP
5.0000 mL | ORAL_SOLUTION | Freq: Once | ORAL | Status: AC
  Administered 2020-09-08: 5 mL via ORAL
  Filled 2020-09-08: qty 5

## 2020-09-08 MED ORDER — SACUBITRIL-VALSARTAN 97-103 MG PO TABS
1.0000 | ORAL_TABLET | Freq: Once | ORAL | Status: AC
  Administered 2020-09-08: 1 via ORAL
  Filled 2020-09-08: qty 1

## 2020-09-08 MED ORDER — AMIODARONE HCL 200 MG PO TABS
200.0000 mg | ORAL_TABLET | Freq: Every day | ORAL | Status: DC
  Administered 2020-09-08: 200 mg via ORAL
  Filled 2020-09-08: qty 1

## 2020-09-08 MED ORDER — ALBUTEROL SULFATE HFA 108 (90 BASE) MCG/ACT IN AERS
2.0000 | INHALATION_SPRAY | Freq: Once | RESPIRATORY_TRACT | Status: AC
  Administered 2020-09-08: 2 via RESPIRATORY_TRACT
  Filled 2020-09-08: qty 6.7

## 2020-09-08 MED ORDER — HYDROCODONE-HOMATROPINE 5-1.5 MG/5ML PO SYRP: 5.0000 mL | ORAL_SOLUTION | Freq: Four times a day (QID) | ORAL | 0 refills | Status: DC | PRN

## 2020-09-08 MED ORDER — FUROSEMIDE 10 MG/ML IJ SOLN
40.0000 mg | Freq: Once | INTRAMUSCULAR | Status: AC
  Administered 2020-09-08: 40 mg via INTRAVENOUS
  Filled 2020-09-08: qty 4

## 2020-09-08 NOTE — ED Triage Notes (Signed)
Pt presents with c/o shortness of breath since 09/27. Pt went to his PCP on Monday and diagnosed with double pneumonia. Pt reports his shortness of breath does not seem to be getting any better.

## 2020-09-08 NOTE — Discharge Instructions (Signed)
1.  Call your cardiologist in the morning to schedule a recheck within the next couple of days. 2.  You were given an IV dose of Lasix 40 mg in the emergency department this evening.  You may continue the amoxicillin as prescribed by your doctor.  Use albuterol inhaler if needed for wheezing.  You may take 1 teaspoon of Hycodan syrup every 6 hours as needed for severe cough.  Continue all of your regularly prescribed medications. 3.  Return to emergency department immediately if you develop worsening shortness of breath, chest pain or other concerning symptoms.

## 2020-09-08 NOTE — ED Provider Notes (Signed)
[facility]-EMERGENCY DEPT Provider Note   CSN: [id number] Arrival date & time: 09/08/20  1727     History Chief Complaint  Patient presents with  . Shortness of Breath    Juan Snyder is a 54 y.o. male.  HPI Patient reports he started getting sick September 27.  He reports it started like a cold.  He was trying some home remedies and did not seek treatment immediately.  He reports over the ensuing couple weeks however he has been getting increasingly short of breath.  He reports he has had a cough that is now productive of some sputum.  He has had a lot of body aches at times.  He has never measured a very elevated fever.  He reports at the beginning of the illness he felt like he had some upper abdominal pain but that has since resolved.  Patient reports that he was seen by his primary doctor last week and they had him get Covid testing which was negative.  He reports that his physician called in an antibiotic but the pharmacy did not fill it due to drug interactions so he did not get an antibiotic until 2 days ago.  At that time he was started on amoxicillin.  He reports since starting the amoxicillin he is now having some diarrhea.  He reports he has spoken to his cardiologist as well and they had concerns for possible flareup of CHF with his increasing shortness of breath.  His Lasix was increased to 80 mg in the morning and 40 mg at night for the past week.  He reports making that change has not improved his shortness of breath at all.    Past Medical History:  Diagnosis Date  . Back pain, chronic   . CHF (congestive heart failure) (HCC)   . Diabetes mellitus   . Dyspnea   . Hypertension   . Obesity   . Sleep apnea    uses CPAP    Patient Active Problem List   Diagnosis Date Noted  . Acute on chronic congestive heart failure (HCC) 06/08/2020  . Acute systolic congestive heart failure (HCC) 06/08/2020  . No-show for appointment 05/27/2020  . Chronic  systolic heart failure (HCC) 01/27/2020  . Paroxysmal atrial flutter (HCC) 01/21/2019  . Acute on chronic diastolic CHF (congestive heart failure) (HCC) 08/10/2018  . Paroxysmal A-fib (HCC) 08/10/2018  . Diabetes (HCC) 07/22/2015  . Essential hypertension, benign 07/22/2015  . Chronic pain syndrome 07/22/2015    Past Surgical History:  Procedure Laterality Date  . CARDIOVERSION N/A 02/18/2018   Procedure: CARDIOVERSION;  Surgeon: Elder Negus, MD;  Location: [facility] ENDOSCOPY;  Service: Cardiovascular;  Laterality: N/A;  . CARDIOVERSION N/A 08/13/2018   Procedure: CARDIOVERSION;  Surgeon: Elder Negus, MD;  Location: [facility] ENDOSCOPY;  Service: Cardiovascular;  Laterality: N/A;  . RIGHT/LEFT HEART CATH AND CORONARY ANGIOGRAPHY N/A 08/12/2018   Procedure: RIGHT/LEFT HEART CATH AND CORONARY ANGIOGRAPHY;  Surgeon: Elder Negus, MD;  Location: [facility] INVASIVE CV LAB;  Service: Cardiovascular;  Laterality: N/A;       Family History  Problem Relation Age of Onset  . Hypertension Mother   . Hypertension Brother     Social History   Tobacco Use  . Smoking status: Never Smoker  . Smokeless tobacco: Never Used  Vaping Use  . Vaping Use: Never used  Substance Use Topics  . Alcohol use: Yes    Comment: liquor on weekends  . Drug use: No  Home Medications Prior to Admission medications   Medication Sig Start Date End Date Taking? Authorizing Provider  albuterol (PROVENTIL HFA;VENTOLIN HFA) 108 (90 Base) MCG/ACT inhaler Inhale 1 puff into the lungs every 6 (six) hours as needed for wheezing or shortness of breath.   Yes [provider]  amiodarone (PACERONE) 200 MG tablet Take 1 tablet (200 mg total) by mouth daily. Patient taking differently: Take 200 mg by mouth 2 (two) times daily.  06/10/20  Yes Patwardhan, Manish J, MD  amoxicillin (AMOXIL) 500 MG tablet Take 500 mg by mouth 3 (three) times daily. 09/06/20  Yes [provider]  ENTRESTO 97-103 MG TAKE 1  TABLET BY MOUTH TWICE A DAY Patient taking differently: Take 1 tablet by mouth 2 (two) times daily.  07/19/20  Yes Patwardhan, Manish J, MD  fluticasone (FLONASE) 50 MCG/ACT nasal spray Place 1 spray into both nostrils daily as needed for allergies.    Yes [provider]  furosemide (LASIX) 40 MG tablet Take 1 tablet (40 mg total) by mouth 2 (two) times daily. One tablet in morning, Additional one tablet in the afternoon for leg swelling, weight gain,as needed Patient taking differently: Take 40 mg by mouth 2 (two) times daily. Additional 1 tablet in the morning if needed. 07/18/20  Yes Patwardhan, Manish J, MD  magnesium oxide (MAG-OX) 400 MG tablet Take 400 mg by mouth 2 (two) times daily.   Yes [provider]  metFORMIN (GLUCOPHAGE) 500 MG tablet TAKE 1 TABLET BY MOUTH 2 TIMES DAILY WITH A MEAL. Patient taking differently: Take 500 mg by mouth 2 (two) times daily with a meal.  01/14/20  Yes Patwardhan, Manish J, MD  methocarbamol (ROBAXIN) 750 MG tablet Take 750 mg by mouth every 8 (eight) hours as needed for muscle spasms.   Yes [provider]  metoprolol succinate (TOPROL-XL) 100 MG 24 hr tablet Take 1 tablet (100 mg total) by mouth daily. Take with or immediately following a meal. Patient taking differently: Take 100 mg by mouth daily.  08/15/18  Yes Randel Pigg, Dorma Russell, MD  Multiple Vitamins-Minerals (MULTIVITAMIN PO) Take 1 tablet by mouth daily.   Yes [provider]  oxyCODONE-acetaminophen (PERCOCET) 10-325 MG tablet Take 1 tablet by mouth every 4 (four) hours as needed for pain.   Yes [provider]  potassium chloride 20 MEQ TBCR Take 10 mEq by mouth daily. 06/10/20  Yes Patwardhan, Manish J, MD  predniSONE (DELTASONE) 20 MG tablet Take 20 mg by mouth daily. 09/05/20  Yes [provider]  tadalafil (CIALIS) 20 MG tablet Take 20 mg by mouth daily as needed for erectile dysfunction.  10/30/19  Yes [provider]  tamsulosin  (FLOMAX) 0.4 MG CAPS capsule Take 0.4 mg by mouth daily.    Yes [provider]  XARELTO 20 MG TABS tablet TAKE 1 TABLET BY MOUTH EVERY DAY Patient taking differently: Take 20 mg by mouth daily.  07/11/20  Yes Patwardhan, Manish J, MD  HYDROcodone-homatropine (HYCODAN) 5-1.5 MG/5ML syrup Take 5 mLs by mouth every 6 (six) hours as needed for cough. 09/08/20   Arby Barrette, MD    Allergies    Patient has no known allergies.  Review of Systems   Review of Systems 10 systems reviewed and negative except as per HPI Physical Exam Updated Vital Signs BP (!) 170/111   Pulse 65   Temp 98.8 F (37.1 C) (Oral)   Resp 14   SpO2 96%   Physical Exam Constitutional:  Comments: Patient is alert with clear mental status.  Mild to moderate increased work of breathing at rest.  Patient is sitting at the edge of the stretcher with tachypnea.  He is speaking in full sentences.  Morbid obesity.  HENT:     Head: Normocephalic and atraumatic.     Mouth/Throat:     Pharynx: Oropharynx is clear.  Eyes:     Extraocular Movements: Extraocular movements intact.  Cardiovascular:     Rate and Rhythm: Normal rate and regular rhythm.  Pulmonary:     Comments: Patient has tachypnea.  He is speaking in full sentences.  Lungs are grossly clear. Abdominal:     Comments: Abdomen is obese and firm.  Patient denies any pain to palpation.  Musculoskeletal:     Comments: No significant peripheral edema.  Calves are soft and nontender.  Skin:    General: Skin is warm and dry.  Neurological:     General: No focal deficit present.     Mental Status: He is oriented to person, place, and time.     Coordination: Coordination normal.  Psychiatric:        Mood and Affect: Mood normal.     ED Results / Procedures / Treatments   Labs (all labs ordered are listed, but only abnormal results are displayed) Labs Reviewed  COMPREHENSIVE METABOLIC PANEL - Abnormal; Notable for the following components:       Result Value   Glucose, Bld 149 (*)    Total Protein 8.6 (*)    Total Bilirubin 1.9 (*)    All other components within normal limits  BRAIN NATRIURETIC PEPTIDE - Abnormal; Notable for the following components:   B Natriuretic Peptide 963.9 (*)    All other components within normal limits  CBC WITH DIFFERENTIAL/PLATELET - Abnormal; Notable for the following components:   WBC 11.0 (*)    HCT 38.5 (*)    Neutro Abs 8.9 (*)    All other components within normal limits  PROTIME-INR - Abnormal; Notable for the following components:   Prothrombin Time 18.0 (*)    INR 1.6 (*)    All other components within normal limits  URINALYSIS, ROUTINE W REFLEX MICROSCOPIC - Abnormal; Notable for the following components:   Protein, ur 100 (*)    All other components within normal limits  BLOOD GAS, VENOUS - Abnormal; Notable for the following components:   pO2, Ven 48.5 (*)    Acid-Base Excess 2.4 (*)    All other components within normal limits  MAGNESIUM - Abnormal; Notable for the following components:   Magnesium 2.5 (*)    All other components within normal limits  TROPONIN I (HIGH SENSITIVITY) - Abnormal; Notable for the following components:   Troponin I (High Sensitivity) 18 (*)    All other components within normal limits  RESPIRATORY PANEL BY RT PCR (FLU A&B, COVID)  CULTURE, BLOOD (ROUTINE X 2)  CULTURE, BLOOD (ROUTINE X 2)  LACTIC ACID, PLASMA  PHOSPHORUS  TROPONIN I (HIGH SENSITIVITY)    EKG EKG Interpretation  Date/Time:  Thursday September 08 2020 17:46:17 EDT Ventricular Rate:  72 PR Interval:    QRS Duration: 134 QT Interval:  438 QTC Calculation: 480 R Axis:   63 Text Interpretation: Sinus rhythm Nonspecific intraventricular conduction delay Nonspecific T abnormalities, lateral leads no sig change from previous Confirmed by Arby Barrette [phone]) on 09/08/2020 6:04:32 PM   Radiology DG Chest 2 View  Result Date: 09/08/2020 CLINICAL DATA:  Cough and shortness of  breath. EXAM: CHEST - 2 VIEW COMPARISON:  June 07, 2020 FINDINGS: There is no evidence of acute infiltrate, pleural effusion or pneumothorax. Mild prominence of the perihilar pulmonary vasculature is seen. There is stable mild to moderate severity enlargement of the cardiac silhouette. The visualized skeletal structures are unremarkable. IMPRESSION: Cardiomegaly with mild pulmonary vascular congestion. Electronically Signed   By: Aram Candela M.D.   On: 09/08/2020 18:08    Procedures Procedures (including critical care time)  Medications Ordered in ED Medications  amiodarone (PACERONE) tablet 200 mg (200 mg Oral Given 09/08/20 2234)  labetalol (NORMODYNE) injection 10 mg (has no administration in time range)  HYDROcodone-homatropine (HYCODAN) 5-1.5 MG/5ML syrup 5 mL (has no administration in time range)  albuterol (VENTOLIN HFA) 108 (90 Base) MCG/ACT inhaler 2 puff (has no administration in time range)  labetalol (NORMODYNE) injection 10 mg (10 mg Intravenous Given 09/08/20 2236)  sacubitril-valsartan (ENTRESTO) 97-103 mg per tablet (1 tablet Oral Given 09/08/20 2236)  furosemide (LASIX) injection 40 mg (40 mg Intravenous Given 09/08/20 2235)    ED Course  I have reviewed the triage vital signs and the nursing notes.  Pertinent labs & imaging results that were available during my care of the patient were reviewed by me and considered in my medical decision making (see chart for details).    MDM Rules/Calculators/A&P                         Recheck: Patient feels much improved.  He has had an IV dose of Lasix, labetalol and home medications.  He is sitting at the edge of stretcher.  Breathing comfortably.  Patient presents as aligned above.  He has multiple medical comorbidities.  Patient has history of congestive heart failure.  He has been taking increased doses of Lasix but not a significant subjective improvement in shortness of breath.  Chest x-ray shows some mild vascular  congestion.  BNP slightly above baseline.  Patient treated with IV dose of Lasix.  Electrolytes are stable.  At this time I do feel he is stable to continue treatment at home with very close follow-up with his cardiologist.  It appears he has acute bronchitis.  Patient had some cold symptoms starting almost a month ago and now has productive cough.  It is not frothy but has slight yellow tinge to it.  Currently symptoms not highly suggestive of severe pneumonia.  Patient was started by his PCP on amoxicillin 2 days ago.  At this time may continue amoxicillin and albuterol inhaler dispensed.  Patient ran out of inhaler at home.  Return precautions reviewed. Final Clinical Impression(s) / ED Diagnoses Final diagnoses:  Bronchitis  Acute on chronic congestive heart failure, unspecified heart failure type [facility])    Rx / DC Orders ED Discharge Orders         Ordered    HYDROcodone-homatropine (HYCODAN) 5-1.5 MG/5ML syrup  Every 6 hours PRN        09/08/20 2339           Arby Barrette, MD 09/08/20 2343

## 2020-09-09 NOTE — ED Notes (Signed)
Pt BP 189/109 upon discharge.  Pt asymptomatic and states that his bp runs high at the hospital. Dr. Nicanor Alcon notified of findings and instructed this nurse to proceed with discharge.   Pt instructed to monitor his pressure at home and return if he becomes symptomatic.

## 2020-09-13 LAB — CULTURE, BLOOD (ROUTINE X 2)
Culture: NO GROWTH
Culture: NO GROWTH

## 2020-09-18 DIAGNOSIS — I5022 Chronic systolic (congestive) heart failure: Secondary | ICD-10-CM | POA: Diagnosis not present

## 2020-10-10 DIAGNOSIS — R69 Illness, unspecified: Secondary | ICD-10-CM | POA: Diagnosis not present

## 2020-11-29 ENCOUNTER — Other Ambulatory Visit: Payer: Medicare HMO

## 2020-12-25 ENCOUNTER — Other Ambulatory Visit: Payer: Self-pay | Admitting: Cardiology

## 2020-12-26 DIAGNOSIS — G4733 Obstructive sleep apnea (adult) (pediatric): Secondary | ICD-10-CM | POA: Diagnosis not present

## 2020-12-27 DIAGNOSIS — R1031 Right lower quadrant pain: Secondary | ICD-10-CM | POA: Diagnosis not present

## 2020-12-27 DIAGNOSIS — R69 Illness, unspecified: Secondary | ICD-10-CM | POA: Diagnosis not present

## 2020-12-27 DIAGNOSIS — Z7251 High risk heterosexual behavior: Secondary | ICD-10-CM | POA: Diagnosis not present

## 2020-12-27 DIAGNOSIS — R109 Unspecified abdominal pain: Secondary | ICD-10-CM | POA: Diagnosis not present

## 2020-12-28 ENCOUNTER — Other Ambulatory Visit: Payer: Self-pay

## 2020-12-28 ENCOUNTER — Emergency Department (HOSPITAL_COMMUNITY)
Admission: EM | Admit: 2020-12-28 | Discharge: 2020-12-28 | Disposition: A | Discharge status: Home / Self Care | Payer: Medicare HMO | Attending: Emergency Medicine | Admitting: Emergency Medicine

## 2020-12-28 ENCOUNTER — Emergency Department (HOSPITAL_COMMUNITY): Payer: Medicare HMO

## 2020-12-28 ENCOUNTER — Encounter (HOSPITAL_COMMUNITY): Payer: Self-pay

## 2020-12-28 DIAGNOSIS — I5021 Acute systolic (congestive) heart failure: Secondary | ICD-10-CM | POA: Diagnosis not present

## 2020-12-28 DIAGNOSIS — R1031 Right lower quadrant pain: Secondary | ICD-10-CM | POA: Diagnosis not present

## 2020-12-28 DIAGNOSIS — Z9861 Coronary angioplasty status: Secondary | ICD-10-CM | POA: Insufficient documentation

## 2020-12-28 DIAGNOSIS — Z7984 Long term (current) use of oral hypoglycemic drugs: Secondary | ICD-10-CM | POA: Diagnosis not present

## 2020-12-28 DIAGNOSIS — R109 Unspecified abdominal pain: Secondary | ICD-10-CM | POA: Diagnosis not present

## 2020-12-28 DIAGNOSIS — I110 Hypertensive heart disease with heart failure: Secondary | ICD-10-CM | POA: Diagnosis not present

## 2020-12-28 DIAGNOSIS — Z95810 Presence of automatic (implantable) cardiac defibrillator: Secondary | ICD-10-CM | POA: Diagnosis not present

## 2020-12-28 DIAGNOSIS — E119 Type 2 diabetes mellitus without complications: Secondary | ICD-10-CM | POA: Diagnosis not present

## 2020-12-28 DIAGNOSIS — Z79899 Other long term (current) drug therapy: Secondary | ICD-10-CM | POA: Insufficient documentation

## 2020-12-28 LAB — CBC
HCT: 40.6 % (ref 39.0–52.0)
Hemoglobin: 13.2 g/dL (ref 13.0–17.0)
MCH: 27.3 pg (ref 26.0–34.0)
MCHC: 32.5 g/dL (ref 30.0–36.0)
MCV: 83.9 fL (ref 80.0–100.0)
Platelets: 249 10*3/uL (ref 150–400)
RBC: 4.84 MIL/uL (ref 4.22–5.81)
RDW: 13.8 % (ref 11.5–15.5)
WBC: 6.6 10*3/uL (ref 4.0–10.5)
nRBC: 0 % (ref 0.0–0.2)

## 2020-12-28 LAB — URINALYSIS, ROUTINE W REFLEX MICROSCOPIC
Bacteria, UA: NONE SEEN
Bilirubin Urine: NEGATIVE
Glucose, UA: NEGATIVE mg/dL
Hgb urine dipstick: NEGATIVE
Ketones, ur: NEGATIVE mg/dL
Leukocytes,Ua: NEGATIVE
Nitrite: NEGATIVE
Protein, ur: 30 mg/dL — AB
Specific Gravity, Urine: 1.021 (ref 1.005–1.030)
pH: 5 (ref 5.0–8.0)

## 2020-12-28 LAB — COMPREHENSIVE METABOLIC PANEL
ALT: 14 U/L (ref 0–44)
AST: 16 U/L (ref 15–41)
Albumin: 4.5 g/dL (ref 3.5–5.0)
Alkaline Phosphatase: 44 U/L (ref 38–126)
Anion gap: 11 (ref 5–15)
BUN: 16 mg/dL (ref 6–20)
CO2: 24 mmol/L (ref 22–32)
Calcium: 9.7 mg/dL (ref 8.9–10.3)
Chloride: 102 mmol/L (ref 98–111)
Creatinine, Ser: 1.17 mg/dL (ref 0.61–1.24)
GFR, Estimated: >60 mL/min (ref >60)
Glucose, Bld: 146 mg/dL — ABNORMAL HIGH (ref 70–99)
Potassium: 4.4 mmol/L (ref 3.5–5.1)
Sodium: 137 mmol/L (ref 135–145)
Total Bilirubin: 1.3 mg/dL — ABNORMAL HIGH (ref 0.3–1.2)
Total Protein: 8 g/dL (ref 6.5–8.1)

## 2020-12-28 LAB — LIPASE, BLOOD: Lipase: 31 U/L (ref 11–51)

## 2020-12-28 MED ORDER — IOHEXOL 300 MG/ML  SOLN
100.0000 mL | Freq: Once | INTRAMUSCULAR | Status: AC | PRN
  Administered 2020-12-28: 100 mL via INTRAVENOUS

## 2020-12-28 NOTE — ED Triage Notes (Signed)
Pt presents with c/o RLQ abdominal pain. Pt reports the pain has been present for approx one week.

## 2020-12-28 NOTE — Discharge Instructions (Signed)
Call your primary care doctor or specialist as discussed in the next 2-3 days.   Return immediately back to the ER if:  Your symptoms worsen within the next 12-24 hours. You develop new symptoms such as new fevers, persistent vomiting, new pain, shortness of breath, or new weakness or numbness, or if you have any other concerns.  

## 2020-12-28 NOTE — ED Provider Notes (Signed)
[facility]-EMERGENCY DEPT Provider Note   CSN: [id number] Arrival date & time: 12/28/20  1507     History Chief Complaint  Patient presents with  . Abdominal Pain    Juan Snyder is a 55 y.o. male.  Patient presents chief complaint of abdominal pain describes in the right side aching and persistent for 1 week.  Denies fevers or cough.  No vomiting or diarrhea.  Nothing seems to make the pain better, presents to ER for further evaluation.        Past Medical History:  Diagnosis Date  . Back pain, chronic   . CHF (congestive heart failure) (HCC)   . Diabetes mellitus   . Dyspnea   . Hypertension   . Obesity   . Sleep apnea    uses CPAP    Patient Active Problem List   Diagnosis Date Noted  . Acute on chronic congestive heart failure (HCC) 06/08/2020  . Acute systolic congestive heart failure (HCC) 06/08/2020  . No-show for appointment 05/27/2020  . Chronic systolic heart failure (HCC) 01/27/2020  . Paroxysmal atrial flutter (HCC) 01/21/2019  . Acute on chronic diastolic CHF (congestive heart failure) (HCC) 08/10/2018  . Paroxysmal A-fib (HCC) 08/10/2018  . Diabetes (HCC) 07/22/2015  . Essential hypertension, benign 07/22/2015  . Chronic pain syndrome 07/22/2015    Past Surgical History:  Procedure Laterality Date  . CARDIOVERSION N/A 02/18/2018   Procedure: CARDIOVERSION;  Surgeon: Elder Negus, MD;  Location: [facility] ENDOSCOPY;  Service: Cardiovascular;  Laterality: N/A;  . CARDIOVERSION N/A 08/13/2018   Procedure: CARDIOVERSION;  Surgeon: Elder Negus, MD;  Location: [facility] ENDOSCOPY;  Service: Cardiovascular;  Laterality: N/A;  . RIGHT/LEFT HEART CATH AND CORONARY ANGIOGRAPHY N/A 08/12/2018   Procedure: RIGHT/LEFT HEART CATH AND CORONARY ANGIOGRAPHY;  Surgeon: Elder Negus, MD;  Location: [facility] INVASIVE CV LAB;  Service: Cardiovascular;  Laterality: N/A;       Family History  Problem Relation Age of Onset  . Hypertension  Mother   . Hypertension Brother     Social History   Tobacco Use  . Smoking status: Never Smoker  . Smokeless tobacco: Never Used  Vaping Use  . Vaping Use: Never used  Substance Use Topics  . Alcohol use: Yes    Comment: liquor on weekends  . Drug use: No    Home Medications Prior to Admission medications   Medication Sig Start Date End Date Taking? Authorizing Provider  albuterol (PROVENTIL HFA;VENTOLIN HFA) 108 (90 Base) MCG/ACT inhaler Inhale 1 puff into the lungs every 6 (six) hours as needed for wheezing or shortness of breath.   Yes [provider]  amiodarone (PACERONE) 200 MG tablet Take 1 tablet (200 mg total) by mouth daily. Patient taking differently: Take 200 mg by mouth 2 (two) times daily. 06/10/20  Yes Patwardhan, Manish J, MD  ENTRESTO 97-103 MG TAKE 1 TABLET BY MOUTH TWICE A DAY Patient taking differently: Take 1 tablet by mouth 2 (two) times daily. 12/27/20  Yes Patwardhan, Manish J, MD  fluticasone (FLONASE) 50 MCG/ACT nasal spray Place 1 spray into both nostrils daily as needed for allergies.    Yes [provider]  furosemide (LASIX) 40 MG tablet Take 1 tablet (40 mg total) by mouth 2 (two) times daily. One tablet in morning, Additional one tablet in the afternoon for leg swelling, weight gain,as needed Patient taking differently: Take 40 mg by mouth 2 (two) times daily. Additional 1 tablet in the morning if needed. 07/18/20  Yes Patwardhan, Manish J, MD  magnesium oxide (MAG-OX) 400 MG tablet Take 200 mg by mouth 2 (two) times daily.   Yes [provider]  metFORMIN (GLUCOPHAGE) 500 MG tablet TAKE 1 TABLET BY MOUTH 2 TIMES DAILY WITH A MEAL. Patient taking differently: Take 500 mg by mouth 2 (two) times daily with a meal. 01/14/20  Yes Patwardhan, Manish J, MD  methocarbamol (ROBAXIN) 750 MG tablet Take 750 mg by mouth every 8 (eight) hours as needed for muscle spasms.   Yes [provider]  metoprolol succinate (TOPROL-XL) 100 MG  24 hr tablet Take 1 tablet (100 mg total) by mouth daily. Take with or immediately following a meal. Patient taking differently: Take 100 mg by mouth daily. 08/15/18  Yes Randel Pigg, Dorma Russell, MD  Multiple Vitamins-Minerals (MULTIVITAMIN PO) Take 1 tablet by mouth daily.   Yes [provider]  oxyCODONE-acetaminophen (PERCOCET) 10-325 MG tablet Take 1 tablet by mouth every 4 (four) hours as needed for pain.   Yes [provider]  potassium chloride 20 MEQ TBCR Take 10 mEq by mouth daily. 06/10/20  Yes Patwardhan, Manish J, MD  tadalafil (CIALIS) 20 MG tablet Take 20 mg by mouth daily as needed for erectile dysfunction.  10/30/19  Yes [provider]  tamsulosin (FLOMAX) 0.4 MG CAPS capsule Take 0.4 mg by mouth daily.    Yes [provider]  XARELTO 20 MG TABS tablet TAKE 1 TABLET BY MOUTH EVERY DAY Patient taking differently: Take 20 mg by mouth daily. 07/11/20  Yes Patwardhan, Anabel Bene, MD    Allergies    Patient has no known allergies.  Review of Systems   Review of Systems  Constitutional: Negative for fever.  HENT: Negative for ear pain and sore throat.   Eyes: Negative for pain.  Respiratory: Negative for cough.   Cardiovascular: Negative for chest pain.  Gastrointestinal: Positive for abdominal pain.  Genitourinary: Negative for flank pain.  Musculoskeletal: Negative for back pain.  Skin: Negative for color change and rash.  Neurological: Negative for syncope.  All other systems reviewed and are negative.   Physical Exam Updated Vital Signs BP (!) 157/92   Pulse (!) 54   Temp 98.9 F (37.2 C) (Oral)   Resp 18   SpO2 97%   Physical Exam Constitutional:      General: He is not in acute distress.    Appearance: He is well-developed.  HENT:     Head: Normocephalic.     Nose: Nose normal.  Eyes:     Extraocular Movements: Extraocular movements intact.  Cardiovascular:     Rate and Rhythm: Normal rate.  Pulmonary:     Effort: Pulmonary  effort is normal.  Abdominal:     Tenderness: There is abdominal tenderness in the right lower quadrant.  Skin:    Coloration: Skin is not jaundiced.  Neurological:     Mental Status: He is alert. Mental status is at baseline.     ED Results / Procedures / Treatments   Labs (all labs ordered are listed, but only abnormal results are displayed) Labs Reviewed  COMPREHENSIVE METABOLIC PANEL - Abnormal; Notable for the following components:      Result Value   Glucose, Bld 146 (*)    Total Bilirubin 1.3 (*)    All other components within normal limits  URINALYSIS, ROUTINE W REFLEX MICROSCOPIC - Abnormal; Notable for the following components:   Protein, ur 30 (*)    All other components within normal limits  LIPASE, BLOOD  CBC    EKG None  Radiology CT Abdomen Pelvis W Contrast  Result Date: 12/28/2020 CLINICAL DATA:  Abdominal pain worse on the right. Symptoms over the last week. EXAM: CT ABDOMEN AND PELVIS WITH CONTRAST TECHNIQUE: Multidetector CT imaging of the abdomen and pelvis was performed using the standard protocol following bolus administration of intravenous contrast. CONTRAST:  OMNIPAQUE IOHEXOL 300 MG/ML  SOLN COMPARISON:  None. FINDINGS: Lower chest: No infiltrate, collapse or effusion. Hepatobiliary: No liver parenchymal lesion. No calcified gallstones. No ductal dilatation. Pancreas: Normal Spleen: Normal Adrenals/Urinary Tract: Adrenal glands are normal. Kidneys are normal. No cyst, mass, stone or hydronephrosis. Bladder is normal. Stomach/Bowel: Stomach and small intestine are normal. The appendix is normal. There is diverticulosis of the left colon but no evidence of diverticulitis. Vascular/Lymphatic: Minimal aortic atherosclerotic calcification. IVC is normal. No adenopathy. Reproductive: Normal Other: No free fluid or air. Small right inguinal hernia containing only fat. Musculoskeletal: Ordinary lower lumbar degenerative changes. IMPRESSION: 1. No acute finding.  No cause of the presenting symptoms is identified. Normal appearing appendix. 2. Diverticulosis of the left colon without evidence of diverticulitis. 3. Small right inguinal hernia containing only fat. Electronically Signed   By: Paulina Fusi M.D.   On: 12/28/2020 22:25    Procedures Procedures   Medications Ordered in ED Medications  iohexol (OMNIPAQUE) 300 MG/ML solution 100 mL (100 mLs Intravenous Contrast Given 12/28/20 2203)    ED Course  I have reviewed the triage vital signs and the nursing notes.  Pertinent labs & imaging results that were available during my care of the patient were reviewed by me and considered in my medical decision making (see chart for details).    MDM Rules/Calculators/A&P                          Labs are sent unremarkable white count normal chemistry normal urinalysis unremarkable.  CT abdomen pelvis pursued. Evidence of acute appendicitis. Right inguinal hernia with fat noted.  Patient advised outpatient follow-up with his doctor within a week. Advised immediate return for worsening pain or fevers.   Final Clinical Impression(s) / ED Diagnoses Final diagnoses:  Abdominal pain, unspecified abdominal location    Rx / DC Orders ED Discharge Orders    None       Cheryll Cockayne, MD 12/28/20 2309

## 2020-12-28 NOTE — ED Notes (Signed)
Patient transported to CT 

## 2021-01-05 DIAGNOSIS — I119 Hypertensive heart disease without heart failure: Secondary | ICD-10-CM | POA: Diagnosis not present

## 2021-01-05 DIAGNOSIS — K4091 Unilateral inguinal hernia, without obstruction or gangrene, recurrent: Secondary | ICD-10-CM | POA: Diagnosis not present

## 2021-01-05 DIAGNOSIS — Z125 Encounter for screening for malignant neoplasm of prostate: Secondary | ICD-10-CM | POA: Diagnosis not present

## 2021-01-05 DIAGNOSIS — Z0000 Encounter for general adult medical examination without abnormal findings: Secondary | ICD-10-CM | POA: Diagnosis not present

## 2021-01-05 DIAGNOSIS — E782 Mixed hyperlipidemia: Secondary | ICD-10-CM | POA: Diagnosis not present

## 2021-01-05 DIAGNOSIS — I5042 Chronic combined systolic (congestive) and diastolic (congestive) heart failure: Secondary | ICD-10-CM | POA: Diagnosis not present

## 2021-01-05 DIAGNOSIS — E1165 Type 2 diabetes mellitus with hyperglycemia: Secondary | ICD-10-CM | POA: Diagnosis not present

## 2021-01-05 DIAGNOSIS — I10 Essential (primary) hypertension: Secondary | ICD-10-CM | POA: Diagnosis not present

## 2021-01-19 DIAGNOSIS — I5022 Chronic systolic (congestive) heart failure: Secondary | ICD-10-CM | POA: Diagnosis not present

## 2021-01-23 DIAGNOSIS — G4733 Obstructive sleep apnea (adult) (pediatric): Secondary | ICD-10-CM | POA: Diagnosis not present

## 2021-02-06 ENCOUNTER — Ambulatory Visit: Payer: Self-pay | Admitting: General Surgery

## 2021-02-06 DIAGNOSIS — K4090 Unilateral inguinal hernia, without obstruction or gangrene, not specified as recurrent: Secondary | ICD-10-CM | POA: Diagnosis not present

## 2021-02-06 NOTE — H&P (Signed)
History of Present Illness Juan Filler MD; 02/06/2021 11:13 AM) The patient is a 55 year old male who presents with an inguinal hernia. patient is a 55 year old male, who has history of CHF, A. fib on Xarelto, sees Dr. Rosemary Snyder.  Patient recently was seen in the ER secondary to right inguinal pain. Patient underwent CT scan which was significant for fat-containing inguinal hernia. I did review the CT scan personally. Patient states that since that time he's had some pain and discomfort. He states it's been more dull and aching recently. Patient appears at that no signs or symptoms of incarceration or granulation.  Patient's had no previous abdominal surgery.   Patient currently on Xarelto, has history of CHF.   Past Surgical History (Juan Snyder, [name]; 02/06/2021 11:04 AM) Shoulder Surgery  Right.  Diagnostic Studies History [facility] Juan Snyder, [name]; 02/06/2021 11:04 AM) Colonoscopy  1-5 years ago  Allergies (Juan Snyder, [name]; 02/06/2021 11:04 AM) No Known Drug Allergies  [02/06/2021]: Allergies Reconciled   Medication History (Juan Snyder, [name]; 02/06/2021 11:05 AM) [name] (97-103MG  Tablet, Oral) Active. Furosemide (40MG  Tablet, Oral) Active. Xarelto (20MG  Tablet, Oral) Active. Amiodarone HCl (200MG  Tablet, Oral) Active. Potassium Chloride ER ( Tablet ER, Oral) Active. Methocarbamol (750MG  Tablet, Oral) Active. Fluticasone Furoate (50MCG/ACT Aero Pow Br Act, Inhalation) Active. oxyCODONE-Acetaminophen (10-325MG  Tablet, Oral) Active. Metoprolol Succinate ER (100MG  Tablet ER 24HR, Oral) Active. Tamsulosin HCl (0.4MG  Capsule, Oral) Active. Tadalafil (20MG  Tablet, Oral) Active. metFORMIN HCl (500MG  Tablet, Oral) Active. Medications Reconciled  Social History , [name]; 02/06/2021 11:04 AM) Alcohol use  Occasional alcohol use. Caffeine use  Tea. No drug use  Tobacco use  Never smoker.  Family History , [name]; 02/06/2021 11:04  AM) Arthritis  Mother. Hypertension  Mother.  Other Problems (Juan , [name]; 02/06/2021 11:04 AM) Atrial Fibrillation  Back Pain  Congestive Heart Failure  Diabetes Mellitus  High blood pressure  Sleep Apnea     Review of Systems MD; 02/06/2021 11:12 AM) General Present- Weight Gain. Not Present- Appetite Loss, Chills, Fatigue, Fever, Night Sweats and Weight Loss. Skin Not Present- Change in Wart/Mole, Dryness, Hives, Jaundice, New Lesions, Non-Healing Wounds, Rash and Ulcer. HEENT Present- Seasonal Allergies. Not Present- Earache, Hearing Loss, Hoarseness, Nose Bleed, Oral Ulcers, Ringing in the Ears, Sinus Pain, Sore Throat, Visual Disturbances, Wears glasses/contact lenses and Yellow Eyes. Respiratory Not Present- Bloody sputum, Chronic Cough, Difficulty Breathing, Snoring and Wheezing. Breast Not Present- Breast Mass, Breast Pain, Nipple Discharge and Skin Changes. Cardiovascular Present- Shortness of Breath. Not Present- Chest Pain, Difficulty Breathing Lying Down, Leg Cramps, Palpitations, Rapid Heart Rate and Swelling of Extremities. Gastrointestinal Present- Abdominal Pain. Not Present- Bloating, Bloody Stool, Change in Bowel Habits, Chronic diarrhea, Constipation, Difficulty Swallowing, Excessive gas, Gets full quickly at meals, Hemorrhoids, Indigestion, Nausea, Rectal Pain and Vomiting. Male Genitourinary Not Present- Blood in Urine, Change in Urinary Stream, Frequency, Impotence, Nocturia, Painful Urination, Urgency and Urine Leakage. Musculoskeletal Present- Back Pain. Not Present- Joint Pain, Joint Stiffness, Muscle Pain, Muscle Weakness and Swelling of Extremities. Neurological Not Present- Decreased Memory, Fainting, Headaches, Numbness, Seizures, Tingling, Tremor, Trouble walking and Weakness. Psychiatric Not Present- Anxiety, Bipolar, Change in Sleep Pattern, Depression, Fearful and Frequent crying. Endocrine Not Present- Cold Intolerance,  Excessive Hunger, Hair Changes, Heat Intolerance, Hot flashes and New Diabetes. All other systems negative  Vitals (Juan Snyder [name]; 02/06/2021 11:06 AM) 02/06/2021 11:06 AM Weight: 303.13 lb Height: 68in Body Surface Area: 2.44 m Body Mass Index: 46.09 kg/m  Temp.: 94.68F  Pulse: 78 (  Regular)        Physical Exam Juan Filler MD; 02/06/2021 11:13 AM) The physical exam findings are as follows: Note: Constitutional: No acute distress, conversant, appears stated age  Eyes: Anicteric sclerae, moist conjunctiva, no lid lag  Neck: No thyromegaly, trachea midline, no cervical lymphadenopathy  Lungs: Clear to auscultation biilaterally, normal respiratory effot  Cardiovascular: regular rate & rhythm, no murmurs, no peripheal edema, pedal pulses 2+  GI: Soft, no masses or hepatosplenomegaly, non-tender to palpation  MSK: Normal gait, no clubbing cyanosis, edema  Skin: No rashes, palpation reveals normal skin turgor  Psychiatric: Appropriate judgment and insight, oriented to person, place, and time  Abdomen Inspection Hernias - Right - Inguinal hernia - Reducible - Right.    Assessment & Plan Juan Filler MD; 02/06/2021 11:14 AM) RIGHT INGUINAL HERNIA (K40.90) Impression: Patient is a 55 year old male with a small right fat-containing inguinal hernia  We'll obtain cardiac clearance and clearance to be off his Xarelto prior to scheduling surgery. 1. The patient will like to proceed to the operating room for laparoscopic right inguinal hernia repair with mesh.  2. I discussed with the patient the signs and symptoms of incarceration and strangulation and the need to proceed to the ER should they occur.  3. I discussed with the patient the risks and benefits of the procedure to include but not limited to: Infection, bleeding, damage to surrounding structures, possible need for further surgery, possible nerve pain, and possible recurrence. The patient was  understanding and wishes to proceed.

## 2021-02-08 ENCOUNTER — Telehealth: Payer: Self-pay | Admitting: Pharmacist

## 2021-02-08 NOTE — Telephone Encounter (Signed)
CARE PLAN ENTRY  02/08/2021 Name: Juan Snyder MRN: [id number] DOB: [birth year]  Juan Snyder is enrolled in Remote Patient Monitoring/Principle Care Monitoring.  Date of Enrollment: 06/14/20 Supervising physician: Truett Mainland Indication: CHF  Remote Readings: Not Compliant. 5 lbs over the past month.   Next scheduled OV: 03/09/21  Pharmacist Clinical Goal(s):  Marland Kitchen Over the next 90 days, patient will demonstrate Improved medication adherence as evidenced by medication fill history . Over the next 90 days, patient will demonstrate improved understanding of prescribed medications and rationale for usage as evidenced by patient teach back . Over the next 90 days, patient will experience decrease in ED visits. ED visits in last 6 months = 2 . Over the next 90 days, patient will not experience hospital admission. Hospital Admissions in last 6 months = 0  Interventions: . Provider and Inter-disciplinary care team collaboration (see longitudinal plan of care) . Comprehensive medication review performed. . Discussed plans with patient for ongoing care management follow up and provided patient with direct contact information for care management team . Collaboration with provider re: medication management  Patient Self Care Activities:  . Self administers medications as prescribed . Attends all scheduled provider appointments . Performs ADL's independently . Performs IADL's independently  No Known Allergies Outpatient Encounter Medications as of 02/08/2021  Medication Sig Note  . albuterol (PROVENTIL HFA;VENTOLIN HFA) 108 (90 Base) MCG/ACT inhaler Inhale 1 puff into the lungs every 6 (six) hours as needed for wheezing or shortness of breath.   Marland Kitchen amiodarone (PACERONE) 200 MG tablet Take 1 tablet (200 mg total) by mouth daily. (Patient taking differently: Take 200 mg by mouth 2 (two) times daily.)   . ENTRESTO 97-103 MG TAKE 1 TABLET BY MOUTH TWICE A DAY (Patient taking  differently: Take 1 tablet by mouth 2 (two) times daily.)   . fluticasone (FLONASE) 50 MCG/ACT nasal spray Place 1 spray into both nostrils daily as needed for allergies.    . furosemide (LASIX) 40 MG tablet Take 1 tablet (40 mg total) by mouth 2 (two) times daily. One tablet in morning, Additional one tablet in the afternoon for leg swelling, weight gain,as needed (Patient taking differently: Take 40 mg by mouth 2 (two) times daily. Additional 1 tablet in the morning if needed.)   . magnesium oxide (MAG-OX) 400 MG tablet Take 200 mg by mouth 2 (two) times daily.   . metFORMIN (GLUCOPHAGE) 500 MG tablet TAKE 1 TABLET BY MOUTH 2 TIMES DAILY WITH A MEAL. (Patient taking differently: Take 500 mg by mouth 2 (two) times daily with a meal.)   . methocarbamol (ROBAXIN) 750 MG tablet Take 750 mg by mouth every 8 (eight) hours as needed for muscle spasms.   . metoprolol succinate (TOPROL-XL) 100 MG 24 hr tablet Take 1 tablet (100 mg total) by mouth daily. Take with or immediately following a meal. (Patient taking differently: Take 100 mg by mouth daily.)   . Multiple Vitamins-Minerals (MULTIVITAMIN PO) Take 1 tablet by mouth daily.   Marland Kitchen oxyCODONE-acetaminophen (PERCOCET) 10-325 MG tablet Take 1 tablet by mouth every 4 (four) hours as needed for pain.   . potassium chloride 20 MEQ TBCR Take 10 mEq by mouth daily.   . tadalafil (CIALIS) 20 MG tablet Take 20 mg by mouth daily as needed for erectile dysfunction.    . tamsulosin (FLOMAX) 0.4 MG CAPS capsule Take 0.4 mg by mouth daily.    Juan Snyder 20 MG TABS tablet TAKE 1 TABLET BY MOUTH EVERY  DAY (Patient taking differently: Take 20 mg by mouth daily.) 12/28/2020: Unk time-    No facility-administered encounter medications on file as of 02/08/2021.    Heart Failure   Type: Systolic  Last ejection fraction: 40-45% NYHA Class: II (slight limitation of activity) AHA HF Stage: B (Heart disease present - no symptoms present)  Patient has failed these meds in  past: triamterene-HCTZ, carvedilol, benazepril, amlodipine, spironolactone,  Patient is currently controlled on the following medications: Entresto 97/103 mg BID, lasix 40 mg BID, metoprolol 100 mg.   We discussed diet and exercise extensively and weighing daily; if you gain more than 3 pounds in one day or 5 pounds in one week call your doctor  Plan  Continue current medications and control with diet and exercise   Pt reports that he has an upcoming hernia surgery  Reviewed with Dr. Rosemary Holms and clearance letter sent to Dr. Derrell Lolling.  ______________ Visit Information SDOH (Social Determinants of Health) assessments performed: Yes.  Mr. Juan Snyder was given information about Principle Care Management/Remote Patient Monitoring services today including:  1. RPM/PCM service includes personalized support from designated clinical staff supervised by his physician, including individualized plan of care and coordination with other care providers 2. 24/7 contact phone numbers for assistance for urgent and routine care needs. 3. Standard insurance, coinsurance, copays and deductibles apply for principle care management only during months in which we provide at least 30 minutes of these services. Most insurances cover these services at 100%, however patients may be responsible for any copay, coinsurance and/or deductible if applicable. This service may help you avoid the need for more expensive face-to-face services. 4. Only one practitioner may furnish and bill the service in a calendar month. 5. The patient may stop PCM/RPM services at any time (effective at the end of the month) by phone call to the office staff.  Patient agreed to services and verbal consent obtained.   Cassell Clement, Pharm.D. Clinical Pharmacist [facility] Cardiovascular [phone] Ext: [phone]

## 2021-02-18 DIAGNOSIS — I5022 Chronic systolic (congestive) heart failure: Secondary | ICD-10-CM | POA: Diagnosis not present

## 2021-02-20 ENCOUNTER — Other Ambulatory Visit (HOSPITAL_COMMUNITY): Payer: Medicare HMO

## 2021-02-22 ENCOUNTER — Encounter (HOSPITAL_BASED_OUTPATIENT_CLINIC_OR_DEPARTMENT_OTHER): Payer: Self-pay

## 2021-02-22 ENCOUNTER — Ambulatory Visit (HOSPITAL_BASED_OUTPATIENT_CLINIC_OR_DEPARTMENT_OTHER): Admit: 2021-02-22 | Payer: Medicare HMO | Admitting: General Surgery

## 2021-02-22 SURGERY — REMOVAL PORT-A-CATH
Anesthesia: Choice

## 2021-02-23 ENCOUNTER — Other Ambulatory Visit: Payer: Self-pay | Admitting: Cardiology

## 2021-02-23 DIAGNOSIS — G4733 Obstructive sleep apnea (adult) (pediatric): Secondary | ICD-10-CM | POA: Diagnosis not present

## 2021-02-23 NOTE — Telephone Encounter (Signed)
Send for 30 days. He's seeing me on 4/21.  Thanks MJP

## 2021-02-26 ENCOUNTER — Other Ambulatory Visit: Payer: Self-pay | Admitting: Cardiology

## 2021-02-26 DIAGNOSIS — N183 Chronic kidney disease, stage 3 unspecified: Secondary | ICD-10-CM

## 2021-02-26 DIAGNOSIS — E1122 Type 2 diabetes mellitus with diabetic chronic kidney disease: Secondary | ICD-10-CM

## 2021-03-02 DIAGNOSIS — J302 Other seasonal allergic rhinitis: Secondary | ICD-10-CM | POA: Diagnosis not present

## 2021-03-02 DIAGNOSIS — E782 Mixed hyperlipidemia: Secondary | ICD-10-CM | POA: Diagnosis not present

## 2021-03-02 DIAGNOSIS — I10 Essential (primary) hypertension: Secondary | ICD-10-CM | POA: Diagnosis not present

## 2021-03-02 DIAGNOSIS — Z0001 Encounter for general adult medical examination with abnormal findings: Secondary | ICD-10-CM | POA: Diagnosis not present

## 2021-03-02 DIAGNOSIS — E1165 Type 2 diabetes mellitus with hyperglycemia: Secondary | ICD-10-CM | POA: Diagnosis not present

## 2021-03-02 DIAGNOSIS — I119 Hypertensive heart disease without heart failure: Secondary | ICD-10-CM | POA: Diagnosis not present

## 2021-03-02 DIAGNOSIS — E559 Vitamin D deficiency, unspecified: Secondary | ICD-10-CM | POA: Diagnosis not present

## 2021-03-09 ENCOUNTER — Other Ambulatory Visit: Payer: Self-pay

## 2021-03-09 ENCOUNTER — Encounter: Payer: Self-pay | Admitting: Cardiology

## 2021-03-09 ENCOUNTER — Ambulatory Visit: Payer: Medicare HMO | Admitting: Cardiology

## 2021-03-09 VITALS — BP 149/88 | HR 66 | Temp 97.9°F | Ht 68.0 in | Wt 300.0 lb

## 2021-03-09 DIAGNOSIS — I480 Paroxysmal atrial fibrillation: Secondary | ICD-10-CM

## 2021-03-09 DIAGNOSIS — N401 Enlarged prostate with lower urinary tract symptoms: Secondary | ICD-10-CM | POA: Diagnosis not present

## 2021-03-09 DIAGNOSIS — R3912 Poor urinary stream: Secondary | ICD-10-CM | POA: Diagnosis not present

## 2021-03-09 DIAGNOSIS — I10 Essential (primary) hypertension: Secondary | ICD-10-CM | POA: Diagnosis not present

## 2021-03-09 DIAGNOSIS — I5022 Chronic systolic (congestive) heart failure: Secondary | ICD-10-CM

## 2021-03-09 DIAGNOSIS — N5201 Erectile dysfunction due to arterial insufficiency: Secondary | ICD-10-CM | POA: Diagnosis not present

## 2021-03-09 NOTE — Progress Notes (Signed)
Patient is here for follow up visit.  Subjective:   Juan Snyder, male    DOB: 10/09/1966, 55 y.o.   MRN: [id number]   Chief Complaint  Patient presents with  . Congestive Heart Failure  . Follow-up     HPI  55 year old African-American male with nonischemic cardiomyopathy, 07/2018, paroxysmal atrial fibrillation/flutter status post successful cardioversion 08/13/2018, not a candidate for ablation due severe LAA enlargement, hypertension, type II diabetes mellitus, morbid obesity, obstructive sleep apnea on CPAP.  He denies chest pain, palpitations, leg edema, orthopnea, PND, TIA/syncope. He has mild exertional dyspnea, unchanged from baseline. Blood pressure mildly elevated.  Current Outpatient Medications on File Prior to Visit  Medication Sig Dispense Refill  . albuterol (PROVENTIL HFA;VENTOLIN HFA) 108 (90 Base) MCG/ACT inhaler Inhale 1 puff into the lungs every 6 (six) hours as needed for wheezing or shortness of breath.    Marland Kitchen amiodarone (PACERONE) 200 MG tablet TAKE 1 TABLET BY MOUTH EVERY DAY 90 tablet 1  . ENTRESTO 97-103 MG TAKE 1 TABLET BY MOUTH TWICE A DAY (Patient taking differently: Take 1 tablet by mouth 2 (two) times daily.) 60 tablet 3  . fluticasone (FLONASE) 50 MCG/ACT nasal spray Place 1 spray into both nostrils daily as needed for allergies.     . furosemide (LASIX) 40 MG tablet Take 1 tablet (40 mg total) by mouth 2 (two) times daily. One tablet in morning, Additional one tablet in the afternoon for leg swelling, weight gain,as needed (Patient taking differently: Take 40 mg by mouth 2 (two) times daily. Additional 1 tablet in the morning if needed.) 120 tablet 1  . magnesium oxide (MAG-OX) 400 MG tablet Take 200 mg by mouth 2 (two) times daily.    . metFORMIN (GLUCOPHAGE) 500 MG tablet TAKE 1 TABLET BY MOUTH 2 TIMES DAILY WITH A MEAL. 180 tablet 1  . methocarbamol (ROBAXIN) 750 MG tablet Take 750 mg by mouth every 8 (eight) hours as needed for muscle spasms.     . metoprolol succinate (TOPROL-XL) 100 MG 24 hr tablet Take 1 tablet (100 mg total) by mouth daily. Take with or immediately following a meal. (Patient taking differently: Take 100 mg by mouth daily.) 30 tablet 0  . Multiple Vitamins-Minerals (MULTIVITAMIN PO) Take 1 tablet by mouth daily.    Marland Kitchen oxyCODONE-acetaminophen (PERCOCET) 10-325 MG tablet Take 1 tablet by mouth every 4 (four) hours as needed for pain.    . potassium chloride 20 MEQ TBCR Take 10 mEq by mouth daily. 60 tablet 2  . tadalafil (CIALIS) 20 MG tablet Take 20 mg by mouth daily as needed for erectile dysfunction.     . tamsulosin (FLOMAX) 0.4 MG CAPS capsule Take 0.4 mg by mouth daily.     Alveda Reasons 20 MG TABS tablet TAKE 1 TABLET BY MOUTH EVERY DAY (Patient taking differently: Take 20 mg by mouth daily.) 90 tablet 3   No current facility-administered medications on file prior to visit.     He has upcoming hernia surgery on 03/23/2021  Recent labs: 12/28/2020: Glucose 146, BUN/Cr 16/1.17. EGFR >60. Na/K 137/4.4. T. Bili 1.3. Rest of the CMP normal H/H 13/40. MCV 83. Platelets 249   06/10/2020: Glucose 99, BUN/Cr 16/1.29. EGFR >60. Na/K 138/3.6.  H/H 11/34. MCV 85. Platelets 139  Results for ALTIN, SEASE (MRN [id number]) as of 06/20/2020 12:49  Ref. Range 06/07/2020 23:23 06/08/2020 04:27 06/09/2020 08:57  B Natriuretic Peptide Latest Ref Range: 0.0 - 100.0 pg/mL 831.9 (H) 996.6 (H) 690.3 (  H)    08/2019-01/2020: BUN/Cr 14/1.1. EGFR 73 HbA1C 6.9% Chol 156, TG 215, HDL 52, LDL 88 TSH 1.7 normal (12/2018)   08/14/2018: Glucose 204, BUN/Cr 20/1.08. EGFR >60. Na/K 137/4.5.  H/H 10.6/32.7. MCV 83. Platelets 257 HbA1C 7.5% TSH 1.2 normal  Cardiovascular studies:  EKG 03/09/2021: Sinus rhythm 60 bpm First degree AV block Diffuse nonspecific T-abnormality  Echocardiogram 05/05/2020:  Left ventricle cavity is normal in size. Moderate concentric hypertrophy  of the left ventricle. Mild global hypokinesis. LVEF 40-45%.  Diastolic  function indeterminate. Elevated LAP. Calculated EF 25%.  Left atrial cavity is severely dilated.  Moderate (Grade II) mitral regurgitation.  Normal right atrial pressure.  Compared to previous study on 04/21/2019, pulmonary hypertension now absent   EKG 01/27/2020: Sinus rhythm 53 bpm.  First degree AV block. Nonspecific T-abnormality.   EKG 09/24/2018: Sinus rhythm 80 bpm. First degree AV block. Left atrial enlargement. Incomplete RBBB.  R&LHC 08/12/2018: Left dominant circulation with no significant CAD Mildly elevated LVEDP (17 mmHg) Moderate WHO Grp II PH (Mean PA 33 mmHg) Mildly decompensated arrhythmia induced nonischemic cardiomyopathy. Continue guideline directed heart failure therapy, and management of Afib.  Recent labs: 06/10/2020: Glucose 99, BUN/Cr 16/1.29. EGFR >60. Na/K 138/3.6.  H/H 11/34. MCV 85. Platelets 129  Review of Systems  Constitutional:       Bilateral breast tenderness  Cardiovascular: Negative for chest pain, dyspnea on exertion, leg swelling, palpitations and syncope.  Gastrointestinal: Positive for melena.       Objective:    Vitals:   03/09/21 1344  BP: (!) 149/88  Pulse: 66  Temp: 97.9 F (36.6 C)  SpO2: 93%       Physical Exam Vitals and nursing note reviewed.  Constitutional:      Appearance: He is well-developed.  Neck:     Vascular: No JVD.  Cardiovascular:     Rate and Rhythm: Normal rate and regular rhythm.     Pulses: Intact distal pulses.     Heart sounds: Normal heart sounds. No murmur heard.   Pulmonary:     Effort: Pulmonary effort is normal.     Breath sounds: Normal breath sounds. No wheezing or rales.        Assessment & Recommendations:   55 year old African-American male with nonischemic cardiomyopathy, 07/2018, paroxysmal atrial fibrillation/flutter, hypertension, type II diabetes mellitus, morbid obesity, obstructive sleep apnea on CPAP.  Nonscehmic cardiomyopathy: Relatively  compensated EF remains 40-45%. Currently on Entresto to 97-103 mg bid,  metoprolol succinate 100 mg daily. Did not tolerate spironolactone due to gynacomastia Continue lasix 40 mg 1-2 tab/day  Paroxysmal Afib/flutter: CHA2DS2VASc score 3, annual stroke risk 3.6% Continue Xarelto 20 mg daily. Continue metoprolol succiante 100 mg daily, amiodarone 200 mg bid. Maintianing sinus rhythm. Successful cardioversion 08/13/2018, not a candidate for ablation due severe LAA enlargement Continue management of OSA with CPAP, weight loss.  Hypertension: Mildly elevated. Emphasized compliance.   OSA: Severe, on CPAP.   Type II DM: Managed by PCP.  F/u in 3 months  [place], MD [facility] Pager: [phone] Office: [phone] If no answer Cell [phone]

## 2021-03-20 ENCOUNTER — Other Ambulatory Visit (HOSPITAL_COMMUNITY)
Admission: RE | Admit: 2021-03-20 | Discharge: 2021-03-20 | Disposition: A | Discharge status: Home / Self Care | Payer: Medicare HMO | Source: Ambulatory Visit | Attending: General Surgery | Admitting: General Surgery

## 2021-03-20 DIAGNOSIS — Z01812 Encounter for preprocedural laboratory examination: Secondary | ICD-10-CM | POA: Diagnosis not present

## 2021-03-20 DIAGNOSIS — Z20822 Contact with and (suspected) exposure to covid-19: Secondary | ICD-10-CM | POA: Diagnosis not present

## 2021-03-21 LAB — SARS CORONAVIRUS 2 (TAT 6-24 HRS): SARS Coronavirus 2: NEGATIVE

## 2021-03-22 ENCOUNTER — Encounter (HOSPITAL_COMMUNITY): Payer: Self-pay | Admitting: General Surgery

## 2021-03-22 ENCOUNTER — Other Ambulatory Visit: Payer: Self-pay

## 2021-03-22 NOTE — Progress Notes (Signed)
Anesthesia Chart Review: Juan Snyder   Case: [id number] Date/Time: 03/23/21 1100   Procedure: LAPAROSCOPIC INGUINAL HERNIA WITH MESH (Right )   Anesthesia type: General   Pre-op diagnosis: RIGHT INGUINAL HERNIA   [facility] / [facility]   Surgeons: Axel Filler, MD      DISCUSSION: Patient is a 55 year old male scheduled for the above procedure. ED visit 12/28/20 for abdominal pain. CT showed diverticulosis of the left colon and right inguinal hernia containing fat.   History includes never smoker, HTN, DM2, chronic systolic CHF (EF 46% 07/2018 with no significant CAD; EF 40% 04/2019), non-ischemic cardiomyopathy, PAF (s/p DCCV 08/13/18, not a candidate for ablation due to severe LAA enlargement), dyspnea, OSA (CPAP), chronic back pain.   Last visit with cardiologist Dr. Rosemary Holms on 03/09/21.  Nonischemic cardiomyopathy felt relatively compensated on Entresto and metoprolol.  Continue Lasix 1-2 times per day.  He did not tolerate spironolactone due to gynecomastia.  He was maintaining sinus rhythm on metoprolol and amiodarone.  On Xarelto for PAF with CHA2DS2VASc score 3, annual stroke risk 3.6%.  CPAP compliance encouraged.  Follow-up in 3 months. He had previously classified patient as "low risk for perioperative CV complication" with permission to hold Xarelto for 2 days prior to surgery (see Media tab).  03/20/2021 presurgical COVID-19 test negative.  Anesthesia team to evaluate on the day of surgery.   VS:  BP Readings from Last 3 Encounters:  03/09/21 (!) 149/88  12/28/20 (!) 145/99  09/09/20 (!) 189/107   Pulse Readings from Last 3 Encounters:  03/09/21 66  12/28/20 65  09/08/20 65    PROVIDERS: Jackie Plum, MD is PCP  Truett Mainland, MD is cardiologist   LABS: For day of surgery as indicated. As of 12/28/20, Cr 1.17, CBC normal, glucose 146.    IMAGES: CT Abd/pelvis 12/28/20: IMPRESSION: 1. No acute finding. No cause of the presenting symptoms  is identified. Normal appearing appendix. 2. Diverticulosis of the left colon without evidence of diverticulitis. 3. Small right inguinal hernia containing only fat.  CXR 09/08/20: FINDINGS: There is no evidence of acute infiltrate, pleural effusion or pneumothorax. Mild prominence of the perihilar pulmonary vasculature is seen. There is stable mild to moderate severity enlargement of the cardiac silhouette. The visualized skeletal structures are unremarkable. IMPRESSION: Cardiomegaly with mild pulmonary vascular congestion.   EKG 03/09/2021: Sinus rhythm 60 bpm First degree AV block Diffuse nonspecific T-abnormality   CV: Echocardiogram 05/05/2020:  Left ventricle cavity is normal in size. Moderate concentric hypertrophy  of the left ventricle. Mild global hypokinesis. LVEF 40-45%. Diastolic  function indeterminate. Elevated LAP. Calculated EF 25%.  Left atrial cavity is severely dilated.  Moderate (Grade II) mitral regurgitation.  Normal right atrial pressure.  Compared to previous study on 04/21/2019, pulmonary hypertension now absent  (Comparison echo 08/10/18: LVEF 20%, diffuse hypokinesis, moderate MR, no PFO identified, PA peak pressure 43 mmHg; 04/21/19: LVEF 40%, grade III diastolic dysfunction, severely dilated LA at 5.2 cm, mild MVP with moderate grade III MR, mild TR, moderate pulmonary hypertension with PASP 51 mmHg, RVSP 51 mmHg)   R&LHC 08/12/2018: Left dominant circulation with no significant CAD Mildly elevated LVEDP (17 mmHg) Moderate WHO Grp II PH (Mean PA 33 mmHg) Mildly decompensated arrhythmia induced nonischemic cardiomyopathy. Continue guideline directed heart failure therapy, and management of Afib.   Past Medical History:  Diagnosis Date  . Back pain, chronic   . CHF (congestive heart failure) (HCC)   . Diabetes mellitus   .  Dyspnea   . Hypertension   . Nonischemic cardiomyopathy (HCC)   . Obesity   . PAF (paroxysmal atrial fibrillation) (HCC)    . Sleep apnea    uses CPAP    Past Surgical History:  Procedure Laterality Date  . CARDIOVERSION N/A 02/18/2018   Procedure: CARDIOVERSION;  Surgeon: Elder Negus, MD;  Location: [facility];  Service: Cardiovascular;  Laterality: N/A;  . CARDIOVERSION N/A 08/13/2018   Procedure: CARDIOVERSION;  Surgeon: Elder Negus, MD;  Location: [facility];  Service: Cardiovascular;  Laterality: N/A;  . RIGHT/LEFT HEART CATH AND CORONARY ANGIOGRAPHY N/A 08/12/2018   Procedure: RIGHT/LEFT HEART CATH AND CORONARY ANGIOGRAPHY;  Surgeon: Elder Negus, MD;  Location: [facility] IN[facility];  Service: Cardiovascular;  Laterality: N/A;    MEDICATIONS: No current facility-administered medications for this encounter.   Marland Kitchen albuterol (PROVENTIL HFA;VENTOLIN HFA) 108 (90 Base) MCG/ACT inhaler  . amiodarone (PACERONE) 200 MG tablet  . ENTRESTO 97-103 MG  . fluticasone (FLONASE) 50 MCG/ACT nasal spray  . furosemide (LASIX) 40 MG tablet  . magnesium oxide (MAG-OX) 400 MG tablet  . metFORMIN (GLUCOPHAGE) 500 MG tablet  . methocarbamol (ROBAXIN) 750 MG tablet  . metoprolol succinate (TOPROL-XL) 100 MG 24 hr tablet  . Multiple Vitamins-Minerals (MULTIVITAMIN PO)  . oxyCODONE-acetaminophen (PERCOCET) 10-325 MG tablet  . potassium chloride 20 MEQ TBCR  . tadalafil (CIALIS) 20 MG tablet  . tamsulosin (FLOMAX) 0.4 MG CAPS capsule  . XARELTO 20 MG TABS tablet    Shonna Chock, PA-C Surgical Short Stay/Anesthesiology [facility] Phone [phone] [facility] [address] Phone [phone] 03/22/2021 12:27 PM

## 2021-03-22 NOTE — Anesthesia Preprocedure Evaluation (Addendum)
Anesthesia Evaluation  Patient identified by MRN, date of birth, ID band Patient awake    Reviewed: Allergy & Precautions, H&P , NPO status , Patient's Chart, lab work & pertinent test results  Airway Mallampati: II  TM Distance: >3 FB Neck ROM: Full    Dental no notable dental hx. (+) Teeth Intact, Dental Advisory Given, Caps,    Pulmonary neg pulmonary ROS, sleep apnea and Continuous Positive Airway Pressure Ventilation ,    Pulmonary exam normal breath sounds clear to auscultation       Cardiovascular Exercise Tolerance: Good hypertension, Pt. on medications and Pt. on home beta blockers +CHF  negative cardio ROS Normal cardiovascular exam Rhythm:Regular Rate:Normal  EKG 03/09/2021: Sinus rhythm 60 bpm First degree AV block Diffuse nonspecific T-abnormality  Echocardiogram 05/05/2020:  Left ventricle cavity is normal in size. Moderate concentric hypertrophy  of the left ventricle. Mild global hypokinesis. LVEF 40-45%. Diastolic  function indeterminate. Elevated LAP. Calculated EF 25%. (2019)  Left atrial cavity is severely dilated.  Moderate (Grade II) mitral regurgitation.  Normal right atrial pressure.  Compared to previous study on 04/21/2019, pulmonary hypertension now absent   R&LHC 08/12/2018: Left dominant circulation with no significant CAD Mildly elevated LVEDP (17 mmHg) Moderate WHO Grp II PH (Mean PA 33 mmHg) Mildly decompensated arrhythmia induced nonischemic cardiomyopathy. Continue guideline directed heart failure therapy, and management of Afib.   Neuro/Psych negative neurological ROS  negative psych ROS   GI/Hepatic negative GI ROS, Neg liver ROS,   Endo/Other  negative endocrine ROSdiabetes, Type 2  Renal/GU negative Renal ROS  negative genitourinary   Musculoskeletal negative musculoskeletal ROS (+)   Abdominal   Peds negative pediatric ROS (+)  Hematology negative hematology ROS (+)    Anesthesia Other Findings   Reproductive/Obstetrics negative OB ROS                          Anesthesia Physical Anesthesia Plan  ASA: III  Anesthesia Plan: General   Post-op Pain Management:    Induction: Intravenous  PONV Risk Score and Plan: 2 and Ondansetron  Airway Management Planned: Oral ETT and LMA  Additional Equipment: None  Intra-op Plan:   Post-operative Plan: Extubation in OR  Informed Consent: I have reviewed the patients History and Physical, chart, labs and discussed the procedure including the risks, benefits and alternatives for the proposed anesthesia with the patient or authorized representative who has indicated his/her understanding and acceptance.       Plan Discussed with: Anesthesiologist and CRNA  Anesthesia Plan Comments: (PAT note written 03/22/2021 by Shonna Chock, PA-C.  History includes never smoker, HTN, DM2, chronic systolic CHF (EF 25% 07/2018 with no significant CAD; EF 40% 04/2019), non-ischemic cardiomyopathy, PAF (s/p DCCV 08/13/18, not a candidate for ablation due to severe LAA enlargement), dyspnea, OSA (CPAP), chronic back pain.   Last visit with cardiologist Dr. Rosemary Holms on 03/09/21.  Nonischemic cardiomyopathy felt relatively compensated on Entresto and metoprolol.  Continue Lasix 1-2 times per day.  He did not tolerate spironolactone due to gynecomastia.  He was maintaining sinus rhythm on metoprolol and amiodarone.  On Xarelto for PAF with CHA2DS2VASc score 3, annual stroke risk 3.6%.  CPAP compliance encouraged.  Follow-up in 3 months. He had previously classified patient as "low risk for perioperative CV complication" with permission to hold Xarelto for 2 days prior to surgery (see Media tab).)     Anesthesia Quick Evaluation

## 2021-03-22 NOTE — Progress Notes (Signed)
PCP - Dr. Jackie Plum, MD Cardiologist - Elder Negus, MD  Chest x-ray - 09/08/20  EKG - 03/09/21 Stress Test -  ECHO - 05/05/20 Cardiac Cath - 08/12/18  Sleep Study - yes  CPAP - yes  Fasting Blood Sugar:  146 Checks Blood Sugar:  1x/wk  Blood Thinner Instructions: per pt last dose Xarelto 03/19/21 Aspirin Instructions:   ERAS Protcol - clears 0815  COVID TEST- 03/20/21  Anesthesia review: yes   -------------  SDW INSTRUCTIONS:  Your procedure is scheduled on 03/23/21. Please report to [facility] Main Entrance "A" at 0845 A.M., and check in at the Admitting office. Call this number if you have problems the morning of surgery: [phone]   Remember: Do not eat after midnight the night before your surgery  You may drink clear liquids until 0815 the morning of your surgery.   Clear liquids allowed are: Water, Non-Citrus Juices (without pulp), Carbonated Beverages, Clear Tea, Black Coffee Only, and Gatorade   Medications to take morning of surgery with a sip of water include: albuterol (PROVENTIL HFA;VENTOLIN HFA) 108 (90 Base) if needed --- Please bring all inhalers with you the day of surgery.  amiodarone (PACERONE) metoprolol succinate (TOPROL-XL)  oxyCODONE-acetaminophen (PERCOCET)  If needed methocarbamol (ROBAXIN) if needed   As of today, STOP taking any Aspirin (unless otherwise instructed by your surgeon), Aleve, Naproxen, Ibuprofen, Motrin, Advil, Goody's, BC's, all herbal medications, fish oil, and all vitamins.  ** PLEASE check your blood sugar the morning of your surgery when you wake up and every 2 hours until you get to the Short Stay unit.  If your blood sugar is less than 70 mg/dL, you will need to treat for low blood sugar: - Do not take insulin. - Treat a low blood sugar (less than 70 mg/dL) with  cup of clear juice (cranberry or apple), 4 glucose tablets, OR glucose gel. - Recheck blood sugar in 15 minutes after treatment (to make sure it is  greater than 70 mg/dL). If your blood sugar is not greater than 70 mg/dL on recheck, call [phone] for further instructions.  * No oral diabetic medications morning of surgery*    The Morning of Surgery Do not wear jewelry Do not wear lotions, powders, colognes, or deodorant   Men may shave face and neck. Do not bring valuables to the hospital. [facility] is not responsible for any belongings or valuables. If you are a smoker, DO NOT Smoke 24 hours prior to surgery If you wear a CPAP at night please bring your mask the morning of surgery  Remember that you must have someone to transport you home after your surgery, and remain with you for 24 hours if you are discharged the same day. Please bring cases for contacts, glasses, hearing aids, dentures or bridgework because it cannot be worn into surgery.   Patients discharged the day of surgery will not be allowed to drive home.   Please shower the NIGHT BEFORE SURGERY and the MORNING OF SURGERY with DIAL Soap. Wear comfortable clothes the morning of surgery. Oral Hygiene is also important to reduce your risk of infection.  Remember - BRUSH YOUR TEETH THE MORNING OF SURGERY WITH YOUR REGULAR TOOTHPASTE  Patient denies shortness of breath, fever, cough and chest pain.

## 2021-03-23 ENCOUNTER — Ambulatory Visit (HOSPITAL_COMMUNITY)
Admission: RE | Admit: 2021-03-23 | Discharge: 2021-03-23 | Disposition: A | Discharge status: Home / Self Care | Payer: Medicare HMO | Attending: General Surgery | Admitting: General Surgery

## 2021-03-23 ENCOUNTER — Encounter (HOSPITAL_COMMUNITY): Payer: Self-pay | Admitting: General Surgery

## 2021-03-23 ENCOUNTER — Ambulatory Visit (HOSPITAL_COMMUNITY): Payer: Medicare HMO | Admitting: Vascular Surgery

## 2021-03-23 ENCOUNTER — Encounter (HOSPITAL_COMMUNITY)
Admission: RE | Disposition: A | Discharge status: Home / Self Care | Payer: Self-pay | Source: Home / Self Care | Attending: General Surgery

## 2021-03-23 DIAGNOSIS — I110 Hypertensive heart disease with heart failure: Secondary | ICD-10-CM | POA: Insufficient documentation

## 2021-03-23 DIAGNOSIS — I509 Heart failure, unspecified: Secondary | ICD-10-CM | POA: Insufficient documentation

## 2021-03-23 DIAGNOSIS — K5730 Diverticulosis of large intestine without perforation or abscess without bleeding: Secondary | ICD-10-CM | POA: Diagnosis not present

## 2021-03-23 DIAGNOSIS — E119 Type 2 diabetes mellitus without complications: Secondary | ICD-10-CM | POA: Diagnosis not present

## 2021-03-23 DIAGNOSIS — Z7901 Long term (current) use of anticoagulants: Secondary | ICD-10-CM | POA: Insufficient documentation

## 2021-03-23 DIAGNOSIS — I272 Pulmonary hypertension, unspecified: Secondary | ICD-10-CM | POA: Insufficient documentation

## 2021-03-23 DIAGNOSIS — I480 Paroxysmal atrial fibrillation: Secondary | ICD-10-CM | POA: Diagnosis not present

## 2021-03-23 DIAGNOSIS — Z8249 Family history of ischemic heart disease and other diseases of the circulatory system: Secondary | ICD-10-CM | POA: Diagnosis not present

## 2021-03-23 DIAGNOSIS — Z7984 Long term (current) use of oral hypoglycemic drugs: Secondary | ICD-10-CM | POA: Diagnosis not present

## 2021-03-23 DIAGNOSIS — K4090 Unilateral inguinal hernia, without obstruction or gangrene, not specified as recurrent: Secondary | ICD-10-CM | POA: Insufficient documentation

## 2021-03-23 DIAGNOSIS — I5043 Acute on chronic combined systolic (congestive) and diastolic (congestive) heart failure: Secondary | ICD-10-CM | POA: Diagnosis not present

## 2021-03-23 DIAGNOSIS — Z79899 Other long term (current) drug therapy: Secondary | ICD-10-CM | POA: Diagnosis not present

## 2021-03-23 DIAGNOSIS — Z8261 Family history of arthritis: Secondary | ICD-10-CM | POA: Insufficient documentation

## 2021-03-23 DIAGNOSIS — I4891 Unspecified atrial fibrillation: Secondary | ICD-10-CM | POA: Diagnosis not present

## 2021-03-23 HISTORY — DX: Paroxysmal atrial fibrillation: I48.0

## 2021-03-23 HISTORY — PX: INGUINAL HERNIA REPAIR: SHX194

## 2021-03-23 HISTORY — DX: Other cardiomyopathies: I42.8

## 2021-03-23 HISTORY — PX: INSERTION OF MESH: SHX5868

## 2021-03-23 LAB — POCT I-STAT, CHEM 8
BUN: 17 mg/dL (ref 6–20)
Calcium, Ion: 1.17 mmol/L (ref 1.15–1.40)
Chloride: 107 mmol/L (ref 98–111)
Creatinine, Ser: 1.2 mg/dL (ref 0.61–1.24)
Glucose, Bld: 142 mg/dL — ABNORMAL HIGH (ref 70–99)
HCT: 39 % (ref 39.0–52.0)
Hemoglobin: 13.3 g/dL (ref 13.0–17.0)
Potassium: 5.1 mmol/L (ref 3.5–5.1)
Sodium: 138 mmol/L (ref 135–145)
TCO2: 25 mmol/L (ref 22–32)

## 2021-03-23 LAB — GLUCOSE, CAPILLARY: Glucose-Capillary: 165 mg/dL — ABNORMAL HIGH (ref 70–99)

## 2021-03-23 SURGERY — REPAIR, HERNIA, INGUINAL, LAPAROSCOPIC
Anesthesia: General | Site: Inguinal | Laterality: Right

## 2021-03-23 MED ORDER — DEXAMETHASONE SODIUM PHOSPHATE 10 MG/ML IJ SOLN
INTRAMUSCULAR | Status: DC | PRN
  Administered 2021-03-23: 5 mg via INTRAVENOUS

## 2021-03-23 MED ORDER — LACTATED RINGERS IV SOLN: INTRAVENOUS | Status: DC | PRN

## 2021-03-23 MED ORDER — ENSURE PRE-SURGERY PO LIQD: 296.0000 mL | Freq: Once | ORAL | Status: DC

## 2021-03-23 MED ORDER — LACTATED RINGERS IV SOLN: INTRAVENOUS | Status: DC

## 2021-03-23 MED ORDER — FENTANYL CITRATE (PF) 100 MCG/2ML IJ SOLN: 25.0000 ug | INTRAMUSCULAR | Status: DC | PRN

## 2021-03-23 MED ORDER — MIDAZOLAM HCL 2 MG/2ML IJ SOLN
INTRAMUSCULAR | Status: AC
  Filled 2021-03-23: qty 2

## 2021-03-23 MED ORDER — PROPOFOL 10 MG/ML IV BOLUS
INTRAVENOUS | Status: AC
  Filled 2021-03-23: qty 40

## 2021-03-23 MED ORDER — CEFAZOLIN SODIUM-DEXTROSE 2-4 GM/100ML-% IV SOLN: 2.0000 g | INTRAVENOUS | Status: DC

## 2021-03-23 MED ORDER — ACETAMINOPHEN 325 MG PO TABS
325.0000 mg | ORAL_TABLET | ORAL | Status: DC | PRN
Start: 2021-03-23 — End: 2021-03-23

## 2021-03-23 MED ORDER — BUPIVACAINE-EPINEPHRINE 0.25% -1:200000 IJ SOLN
INTRAMUSCULAR | Status: DC | PRN
  Administered 2021-03-23: 3 mL

## 2021-03-23 MED ORDER — OXYCODONE HCL 5 MG PO TABS
5.0000 mg | ORAL_TABLET | Freq: Once | ORAL | Status: AC | PRN
  Administered 2021-03-23: 5 mg via ORAL

## 2021-03-23 MED ORDER — ROCURONIUM BROMIDE 10 MG/ML (PF) SYRINGE
PREFILLED_SYRINGE | INTRAVENOUS | Status: DC | PRN
  Administered 2021-03-23: 60 mg via INTRAVENOUS

## 2021-03-23 MED ORDER — FENTANYL CITRATE (PF) 250 MCG/5ML IJ SOLN
INTRAMUSCULAR | Status: AC
  Filled 2021-03-23: qty 5

## 2021-03-23 MED ORDER — ONDANSETRON HCL 4 MG/2ML IJ SOLN: 4.0000 mg | Freq: Once | INTRAMUSCULAR | Status: DC | PRN

## 2021-03-23 MED ORDER — MEPERIDINE HCL 25 MG/ML IJ SOLN: 6.2500 mg | INTRAMUSCULAR | Status: DC | PRN

## 2021-03-23 MED ORDER — OXYCODONE HCL 5 MG PO TABS
ORAL_TABLET | ORAL | Status: AC
  Filled 2021-03-23: qty 1

## 2021-03-23 MED ORDER — SUGAMMADEX SODIUM 200 MG/2ML IV SOLN
INTRAVENOUS | Status: DC | PRN
  Administered 2021-03-23: 400 mg via INTRAVENOUS

## 2021-03-23 MED ORDER — ACETAMINOPHEN 160 MG/5ML PO SOLN: 325.0000 mg | ORAL | Status: DC | PRN

## 2021-03-23 MED ORDER — ACETAMINOPHEN 500 MG PO TABS
ORAL_TABLET | ORAL | Status: AC
  Administered 2021-03-23: 1000 mg via ORAL
  Filled 2021-03-23: qty 2

## 2021-03-23 MED ORDER — CHLORHEXIDINE GLUCONATE CLOTH 2 % EX PADS: 6.0000 | MEDICATED_PAD | Freq: Once | CUTANEOUS | Status: DC

## 2021-03-23 MED ORDER — OXYCODONE HCL 5 MG/5ML PO SOLN: 5.0000 mg | Freq: Once | ORAL | Status: AC | PRN

## 2021-03-23 MED ORDER — 0.9 % SODIUM CHLORIDE (POUR BTL) OPTIME
TOPICAL | Status: DC | PRN
  Administered 2021-03-23: 1000 mL

## 2021-03-23 MED ORDER — TRAMADOL HCL 50 MG PO TABS: 50.0000 mg | ORAL_TABLET | Freq: Four times a day (QID) | ORAL | 0 refills | Status: AC | PRN

## 2021-03-23 MED ORDER — DEXTROSE 5 % IV SOLN
INTRAVENOUS | Status: DC | PRN
  Administered 2021-03-23: 3 g via INTRAVENOUS

## 2021-03-23 MED ORDER — PROPOFOL 10 MG/ML IV BOLUS
INTRAVENOUS | Status: DC | PRN
  Administered 2021-03-23: 200 mg via INTRAVENOUS

## 2021-03-23 MED ORDER — DEXTROSE 5 % IV SOLN
3.0000 g | INTRAVENOUS | Status: DC
  Filled 2021-03-23: qty 3000

## 2021-03-23 MED ORDER — CHLORHEXIDINE GLUCONATE 0.12 % MT SOLN
OROMUCOSAL | Status: AC
  Administered 2021-03-23: 15 mL via OROMUCOSAL
  Filled 2021-03-23: qty 15

## 2021-03-23 MED ORDER — MIDAZOLAM HCL 5 MG/5ML IJ SOLN
INTRAMUSCULAR | Status: DC | PRN
  Administered 2021-03-23: 2 mg via INTRAVENOUS

## 2021-03-23 MED ORDER — BUPIVACAINE-EPINEPHRINE (PF) 0.25% -1:200000 IJ SOLN
INTRAMUSCULAR | Status: AC
  Filled 2021-03-23: qty 30

## 2021-03-23 MED ORDER — PROPOFOL 10 MG/ML IV BOLUS
INTRAVENOUS | Status: AC
  Filled 2021-03-23: qty 20

## 2021-03-23 MED ORDER — LIDOCAINE 2% (20 MG/ML) 5 ML SYRINGE
INTRAMUSCULAR | Status: DC | PRN
  Administered 2021-03-23: 100 mg via INTRAVENOUS

## 2021-03-23 MED ORDER — ORAL CARE MOUTH RINSE: 15.0000 mL | Freq: Once | OROMUCOSAL | Status: AC

## 2021-03-23 MED ORDER — ONDANSETRON HCL 4 MG/2ML IJ SOLN
INTRAMUSCULAR | Status: DC | PRN
  Administered 2021-03-23: 4 mg via INTRAVENOUS

## 2021-03-23 MED ORDER — FENTANYL CITRATE (PF) 250 MCG/5ML IJ SOLN
INTRAMUSCULAR | Status: DC | PRN
  Administered 2021-03-23: 100 ug via INTRAVENOUS
  Administered 2021-03-23 (×2): 50 ug via INTRAVENOUS

## 2021-03-23 MED ORDER — CHLORHEXIDINE GLUCONATE 0.12 % MT SOLN: 15.0000 mL | Freq: Once | OROMUCOSAL | Status: AC

## 2021-03-23 MED ORDER — STERILE WATER FOR IRRIGATION IR SOLN
Status: DC | PRN
  Administered 2021-03-23: 1000 mL

## 2021-03-23 MED ORDER — ACETAMINOPHEN 500 MG PO TABS: 1000.0000 mg | ORAL_TABLET | ORAL | Status: AC

## 2021-03-23 SURGICAL SUPPLY — 42 items
ADH SKN CLS APL DERMABOND .7 (GAUZE/BANDAGES/DRESSINGS) ×1
CANISTER SUCT 3000ML PPV (MISCELLANEOUS) IMPLANT
COVER SURGICAL LIGHT HANDLE (MISCELLANEOUS) ×2 IMPLANT
COVER WAND RF STERILE (DRAPES) ×2 IMPLANT
DERMABOND ADVANCED (GAUZE/BANDAGES/DRESSINGS) ×1
DERMABOND ADVANCED .7 DNX12 (GAUZE/BANDAGES/DRESSINGS) ×1 IMPLANT
DISSECTOR BLUNT TIP ENDO 5MM (MISCELLANEOUS) IMPLANT
ELECT REM PT RETURN 9FT ADLT (ELECTROSURGICAL) ×2
ELECTRODE REM PT RTRN 9FT ADLT (ELECTROSURGICAL) ×1 IMPLANT
GLOVE BIO SURGEON STRL SZ7.5 (GLOVE) ×2 IMPLANT
GOWN STRL REUS W/ TWL LRG LVL3 (GOWN DISPOSABLE) ×2 IMPLANT
GOWN STRL REUS W/ TWL XL LVL3 (GOWN DISPOSABLE) ×1 IMPLANT
GOWN STRL REUS W/TWL LRG LVL3 (GOWN DISPOSABLE) ×4
GOWN STRL REUS W/TWL XL LVL3 (GOWN DISPOSABLE) ×2
KIT BASIN OR (CUSTOM PROCEDURE TRAY) ×2 IMPLANT
KIT TURNOVER KIT B (KITS) ×2 IMPLANT
MESH 3DMAX 5X7 RT XLRG (Mesh General) ×1 IMPLANT
NDL INSUFFLATION 14GA 120MM (NEEDLE) IMPLANT
NEEDLE INSUFFLATION 14GA 120MM (NEEDLE) IMPLANT
NS IRRIG 1000ML POUR BTL (IV SOLUTION) ×2 IMPLANT
PAD ARMBOARD 7.5X6 YLW CONV (MISCELLANEOUS) ×4 IMPLANT
RELOAD STAPLE 4.0 BLU F/HERNIA (INSTRUMENTS) IMPLANT
RELOAD STAPLE 4.8 BLK F/HERNIA (STAPLE) IMPLANT
RELOAD STAPLE HERNIA 4.0 BLUE (INSTRUMENTS) IMPLANT
RELOAD STAPLE HERNIA 4.8 BLK (STAPLE) IMPLANT
SCISSORS LAP 5X35 DISP (ENDOMECHANICALS) ×2 IMPLANT
SET IRRIG TUBING LAPAROSCOPIC (IRRIGATION / IRRIGATOR) IMPLANT
SET TUBE SMOKE EVAC HIGH FLOW (TUBING) ×2 IMPLANT
STAPLER HERNIA 12 8.5 360D (INSTRUMENTS) IMPLANT
SUT MNCRL AB 4-0 PS2 18 (SUTURE) ×2 IMPLANT
SUT VIC AB 1 CT1 27 (SUTURE)
SUT VIC AB 1 CT1 27XBRD ANBCTR (SUTURE) IMPLANT
SYRINGE TOOMEY DISP (SYRINGE) ×2 IMPLANT
TOWEL GREEN STERILE (TOWEL DISPOSABLE) ×2 IMPLANT
TOWEL GREEN STERILE FF (TOWEL DISPOSABLE) ×2 IMPLANT
TRAY FOLEY W/BAG SLVR 14FR (SET/KITS/TRAYS/PACK) ×2 IMPLANT
TRAY LAPAROSCOPIC MC (CUSTOM PROCEDURE TRAY) ×2 IMPLANT
TROCAR OPTICAL SHORT 5MM (TROCAR) ×2 IMPLANT
TROCAR OPTICAL SLV SHORT 5MM (TROCAR) ×2 IMPLANT
TROCAR XCEL 12X100 BLDLESS (ENDOMECHANICALS) ×2 IMPLANT
WARMER LAPAROSCOPE (MISCELLANEOUS) ×2 IMPLANT
WATER STERILE IRR 1000ML POUR (IV SOLUTION) ×2 IMPLANT

## 2021-03-23 NOTE — Discharge Instructions (Signed)
[facility] _______[facility] °INGUINAL HERNIA REPAIR: POST OP INSTRUCTIONS ° °Always review your discharge instruction sheet given to you by the facility where your surgery was performed. °IF YOU HAVE DISABILITY OR FAMILY LEAVE FORMS, YOU MUST BRING THEM TO THE OFFICE FOR PROCESSING.   °DO NOT GIVE THEM TO YOUR DOCTOR. ° °1. A  prescription for pain medication may be given to you upon discharge.  Take your pain medication as prescribed, if needed.  If narcotic pain medicine is not needed, then you may take acetaminophen (Tylenol) or ibuprofen (Advil) as needed. °2. Take your usually prescribed medications unless otherwise directed. °If you need a refill on your pain medication, please contact your pharmacy.  They will contact our office to request authorization. Prescriptions will not be filled after 5 pm or on week-ends. °3. You should follow a light diet the first 24 hours after arrival home, such as soup and crackers, etc.  Be sure to include lots of fluids daily.  Resume your normal diet the day after surgery. °4.Most patients will experience some swelling and bruising around the umbilicus or in the groin and scrotum.  Ice packs and reclining will help.  Swelling and bruising can take several days to resolve.  °6. It is common to experience some constipation if taking pain medication after surgery.  Increasing fluid intake and taking a stool softener (such as Colace) will usually help or prevent this problem from occurring.  A mild laxative (Milk of Magnesia or Miralax) should be taken according to package directions if there are no bowel movements after 48 hours. °7. Unless discharge instructions indicate otherwise, you may remove your bandages 24-48 hours after surgery, and you may shower at that time.  You may have steri-strips (small skin tapes) in place directly over the incision.  These strips should be left on the skin for 7-10 days.  If your surgeon used skin glue on the incision, you may  shower in 24 hours.  The glue will flake off over the next 2-3 weeks.  Any sutures or staples will be removed at the office during your follow-up visit. °8. ACTIVITIES:  You may resume regular (light) daily activities beginning the next day--such as daily self-care, walking, climbing stairs--gradually increasing activities as tolerated.  You may have sexual intercourse when it is comfortable.  Refrain from any heavy lifting or straining until approved by your doctor. ° °a.You may drive when you are no longer taking prescription pain medication, you can comfortably wear a seatbelt, and you can safely maneuver your car and apply brakes. °b.RETURN TO WORK:   °_____________________________________________ ° °9.You should see your doctor in the office for a follow-up appointment approximately 2-3 weeks after your surgery.  Make sure that you call for this appointment within a day or two after you arrive home to insure a convenient appointment time. °10.OTHER INSTRUCTIONS: _________________________ °   _____________________________________ ° °WHEN TO CALL YOUR DOCTOR: °1. Fever over 101.0 °2. Inability to urinate °3. Nausea and/or vomiting °4. Extreme swelling or bruising °5. Continued bleeding from incision. °6. Increased pain, redness, or drainage from the incision ° °The clinic staff is available to answer your questions during regular business hours.  Please don’t hesitate to call and ask to speak to one of the nurses for clinical concerns.  If you have a medical emergency, go to the nearest emergency room or call 911.  A surgeon from [facility] is always on call at the hospital ° ° °[address], [address], [place], [place]  [address] ? ° [address] [address], [place],    [address] °[phone] ? [phone] ? FAX [phone] °Web site: [email or web address] ° °

## 2021-03-23 NOTE — Op Note (Signed)
03/23/2021  9:51 AM  PATIENT:  Juan Snyder  55 y.o. male  PRE-OPERATIVE DIAGNOSIS:  RIGHT INGUINAL HERNIA  POST-OPERATIVE DIAGNOSIS:  RIGHT PANTALOON INGUINAL HERNIA  PROCEDURE:  Procedure(s): LAPAROSCOPIC INGUINAL HERNIA WITH MESH (Right) INSERTION OF MESH (Right)  SURGEON:  Surgeon(s) and Role:    Axel Filler, MD - Primary  ANESTHESIA:   local and general  EBL:  minimal   BLOOD ADMINISTERED:none  DRAINS: none   LOCAL MEDICATIONS USED:  BUPIVICAINE   SPECIMEN:  No Specimen  DISPOSITION OF SPECIMEN:  N/A  COUNTS:  YES  TOURNIQUET:  * No tourniquets in log *  DICTATION: .Dragon Dictation Counts: reported as correct x 2  Findings:  The patient had a right direct & indirect hernia and a moderated sized cord lipoma  Indications for procedure:  The patient is a 55 year old male with a right inguinal hernia for several months. Patient complained of symptomatology to his right inguinal area. The patient was taken back for elective inguinal hernia repair.  Details of the procedure: The patient was taken back to the operating room. The patient was placed in supine position with bilateral SCDs in place.  The patient was prepped and draped in the usual sterile fashion.  After appropriate anitbiotics were confirmed, a time-out was confirmed and all facts were verified.  0.25% Marcaine was used to infiltrate the umbilical area. A 11-blade was used to cut down the skin and blunt dissection was used to get the anterior fashion.  The anterior fascia was incised approximately 1 cm and the muscles were retracted laterally. Blunt dissection was then used to create a space in the preperitoneal area. At this time a 10 mm camera was then introduced into the space and advanced the pubic tubercle and a 12 mm trocar was placed over this and insufflation was started.  At this time and space was created from medial to laterally the preperitoneal space.  Cooper's ligament was initially  cleaned off.  The hernia sac was identified in the indirect space. Dissection of the hernia sac and cord structures was undertaken the vas deferens was identified and protected in all parts of the case.  There was a small direct defect and this was reduced.  Once the hernia sac was taken down to approximately the umbilicus a Bard 3D Max mesh, size: Barney Drain, was  introduced into the preperitoneal space.  The mesh was brought over to cover the direct and indirect hernia spaces.  This was anchored into place and secured to Cooper's ligament with 4.61mm staples from a Coviden hernia stapler. It was anchored to the anterior abdominal wall with 4.8 mm staples. The hernia sac was seen lying posterior to the mesh. There was no staples placed laterally. The insufflation was evacuated and the peritoneum was seen posterior to the mesh. The trochars were removed. The anterior fascia was reapproximated using #1 Vicryl on a UR- 6.  Intra-abdominal air was evacuated and the Veress needle removed. The skin was reapproximated using 4-0 Monocryl subcuticular fashion and Dermabond. The patient was awakened from general anesthesia and taken to recovery in stable condition.   PLAN OF CARE: Discharge to home after PACU  PATIENT DISPOSITION:  PACU - hemodynamically stable.   Delay start of Pharmacological VTE agent (>24hrs) due to surgical blood loss or risk of bleeding: not applicable

## 2021-03-23 NOTE — H&P (Signed)
History of Present Illness The patient is a 55 year old male who presents with an inguinal hernia. patient is a 55 year old male, who has history of CHF, A. fib on Xarelto, sees Dr. Rosemary Holms.  Patient recently was seen in the ER secondary to right inguinal pain. Patient underwent CT scan which was significant for fat-containing inguinal hernia. I did review the CT scan personally. Patient states that since that time he's had some pain and discomfort. He states it's been more dull and aching recently. Patient appears at that no signs or symptoms of incarceration or granulation.  Patient's had no previous abdominal surgery.   Patient currently on Xarelto, has history of CHF.   Past Surgical History Shoulder Surgery  Right.  Diagnostic Studies History Colonoscopy  1-5 years ago  Allergies  No Known Drug Allergies  [02/06/2021]: Allergies Reconciled   Medication History Entresto (97-103MG  Tablet, Oral) Active. Furosemide (40MG  Tablet, Oral) Active. Xarelto (20MG  Tablet, Oral) Active. Amiodarone HCl (200MG  Tablet, Oral) Active. Potassium Chloride ER ( Tablet ER, Oral) Active. Methocarbamol (750MG  Tablet, Oral) Active. Fluticasone Furoate (50MCG/ACT Aero Pow Br Act, Inhalation) Active. oxyCODONE-Acetaminophen (10-325MG  Tablet, Oral) Active. Metoprolol Succinate ER (100MG  Tablet ER 24HR, Oral) Active. Tamsulosin HCl (0.4MG  Capsule, Oral) Active. Tadalafil (20MG  Tablet, Oral) Active. metFORMIN HCl (500MG  Tablet, Oral) Active. Medications Reconciled  Social History Alcohol use  Occasional alcohol use. Caffeine use  Tea. No drug use  Tobacco use  Never smoker.  Family History Arthritis  Mother. Hypertension  Mother.  Other Problems Atrial Fibrillation  Back Pain  Congestive Heart Failure  Diabetes Mellitus  High blood pressure  Sleep Apnea     Review of Systems  General Present- Weight Gain. Not Present- Appetite  Loss, Chills, Fatigue, Fever, Night Sweats and Weight Loss. Skin Not Present- Change in Wart/Mole, Dryness, Hives, Jaundice, New Lesions, Non-Healing Wounds, Rash and Ulcer. HEENT Present- Seasonal Allergies. Not Present- Earache, Hearing Loss, Hoarseness, Nose Bleed, Oral Ulcers, Ringing in the Ears, Sinus Pain, Sore Throat, Visual Disturbances, Wears glasses/contact lenses and Yellow Eyes. Respiratory Not Present- Bloody sputum, Chronic Cough, Difficulty Breathing, Snoring and Wheezing. Breast Not Present- Breast Mass, Breast Pain, Nipple Discharge and Skin Changes. Cardiovascular Present- Shortness of Breath. Not Present- Chest Pain, Difficulty Breathing Lying Down, Leg Cramps, Palpitations, Rapid Heart Rate and Swelling of Extremities. Gastrointestinal Present- Abdominal Pain. Not Present- Bloating, Bloody Stool, Change in Bowel Habits, Chronic diarrhea, Constipation, Difficulty Swallowing, Excessive gas, Gets full quickly at meals, Hemorrhoids, Indigestion, Nausea, Rectal Pain and Vomiting. Male Genitourinary Not Present- Blood in Urine, Change in Urinary Stream, Frequency, Impotence, Nocturia, Painful Urination, Urgency and Urine Leakage. Musculoskeletal Present- Back Pain. Not Present- Joint Pain, Joint Stiffness, Muscle Pain, Muscle Weakness and Swelling of Extremities. Neurological Not Present- Decreased Memory, Fainting, Headaches, Numbness, Seizures, Tingling, Tremor, Trouble walking and Weakness. Psychiatric Not Present- Anxiety, Bipolar, Change in Sleep Pattern, Depression, Fearful and Frequent crying. Endocrine Not Present- Cold Intolerance, Excessive Hunger, Hair Changes, Heat Intolerance, Hot flashes and New Diabetes. All other systems negative BP (!) 166/97   Pulse (!) 59   Temp 98.3 F (36.8 C)   Resp 20   Ht 5\' 8"  (1.727 m)   Wt 133.8 kg   SpO2 97%   BMI 44.85 kg/m     Physical Exam The physical exam findings are as follows: Note: Constitutional: No acute distress,  conversant, appears stated age  Eyes: Anicteric sclerae, moist conjunctiva, no lid lag  Neck: No thyromegaly, trachea midline, no cervical lymphadenopathy  Lungs: Clear to  auscultation biilaterally, normal respiratory effot  Cardiovascular: regular rate & rhythm, no murmurs, no peripheal edema, pedal pulses 2+  GI: Soft, no masses or hepatosplenomegaly, non-tender to palpation  MSK: Normal gait, no clubbing cyanosis, edema  Skin: No rashes, palpation reveals normal skin turgor  Psychiatric: Appropriate judgment and insight, oriented to person, place, and time  Abdomen Inspection Hernias - Right - Inguinal hernia - Reducible - Right.    Assessment & Plan RIGHT INGUINAL HERNIA (K40.90) Impression: Patient is a 55 year old male with a small right fat-containing inguinal hernia  We'll obtain cardiac clearance and clearance to be off his Xarelto prior to scheduling surgery. 1. The patient will like to proceed to the operating room for laparoscopic right inguinal hernia repair with mesh.  2. I discussed with the patient the signs and symptoms of incarceration and strangulation and the need to proceed to the ER should they occur.  3. I discussed with the patient the risks and benefits of the procedure to include but not limited to: Infection, bleeding, damage to surrounding structures, possible need for further surgery, possible nerve pain, and possible recurrence. The patient was understanding and wishes to proceed.

## 2021-03-23 NOTE — Transfer of Care (Signed)
Immediate Anesthesia Transfer of Care Note  Patient: Juan Snyder  Procedure(s) Performed: LAPAROSCOPIC INGUINAL HERNIA WITH MESH (Right Inguinal) INSERTION OF MESH (Right Inguinal)  Patient Location: PACU  Anesthesia Type:General  Level of Consciousness: awake and alert   Airway & Oxygen Therapy: Patient Spontanous Breathing and Patient connected to face mask  Post-op Assessment: Report given to RN and Post -op Vital signs reviewed and stable  Post vital signs: Reviewed and stable  Last Vitals:  Vitals Value Taken Time  BP 130/83 03/23/21 1012  Temp 36.2 C 03/23/21 1012  Pulse 47 03/23/21 1015  Resp 8 03/23/21 1015  SpO2 100 % 03/23/21 1015  Vitals shown include unvalidated device data.  Last Pain:  Vitals:   03/23/21 1012  PainSc: 0-No pain      Patients Stated Pain Goal: 2 (03/23/21 0835)  Complications: No complications documented.

## 2021-03-23 NOTE — Anesthesia Procedure Notes (Signed)
Procedure Name: Intubation Date/Time: 03/23/2021 9:16 AM Performed by: Glynda Jaeger, CRNA Pre-anesthesia Checklist: Patient identified, Patient being monitored, Timeout performed, Emergency Drugs available and Suction available Patient Re-evaluated:Patient Re-evaluated prior to induction Oxygen Delivery Method: Circle System Utilized Preoxygenation: Pre-oxygenation with 100% oxygen Induction Type: IV induction Ventilation: Oral airway inserted - appropriate to patient size and Two handed mask ventilation required Laryngoscope Size: Mac and 4 Grade View: Grade III Tube type: Oral Tube size: 7.5 mm Number of attempts: 2 Airway Equipment and Method: Stylet Placement Confirmation: ETT inserted through vocal cords under direct vision,  positive ETCO2 and breath sounds checked- equal and bilateral Secured at: 23 cm Tube secured with: Tape Dental Injury: Teeth and Oropharynx as per pre-operative assessment  Comments: Attempt by SRNA x1. MDA successfully intubated on second attempt

## 2021-03-23 NOTE — Anesthesia Postprocedure Evaluation (Signed)
Anesthesia Post Note  Patient: CASTIEL LAURICELLA  Procedure(s) Performed: LAPAROSCOPIC INGUINAL HERNIA WITH MESH (Right Inguinal) INSERTION OF MESH (Right Inguinal)     Patient location during evaluation: PACU Anesthesia Type: General Level of consciousness: awake and alert Pain management: pain level controlled Vital Signs Assessment: post-procedure vital signs reviewed and stable Respiratory status: spontaneous breathing, nonlabored ventilation, respiratory function stable and patient connected to nasal cannula oxygen Cardiovascular status: blood pressure returned to baseline and stable Postop Assessment: no apparent nausea or vomiting Anesthetic complications: no   No complications documented.  Last Vitals:  Vitals:   03/23/21 1042 03/23/21 1057  BP: 126/74 126/75  Pulse: (!) 50 (!) 48  Resp: (!) 21 15  Temp:  36.6 C  SpO2: 96% 96%    Last Pain:  Vitals:   03/23/21 1057  PainSc: 2                  Sanyah Molnar

## 2021-03-24 ENCOUNTER — Encounter (HOSPITAL_COMMUNITY): Payer: Self-pay | Admitting: General Surgery

## 2021-03-29 IMAGING — CR DG CHEST 2V
2 series · 2 of 2 positions shown · non-contrast
Comparison: June 07, 2020

CLINICAL DATA: Cough and shortness of breath.

EXAM:
CHEST - 2 VIEW

[w chest pa]
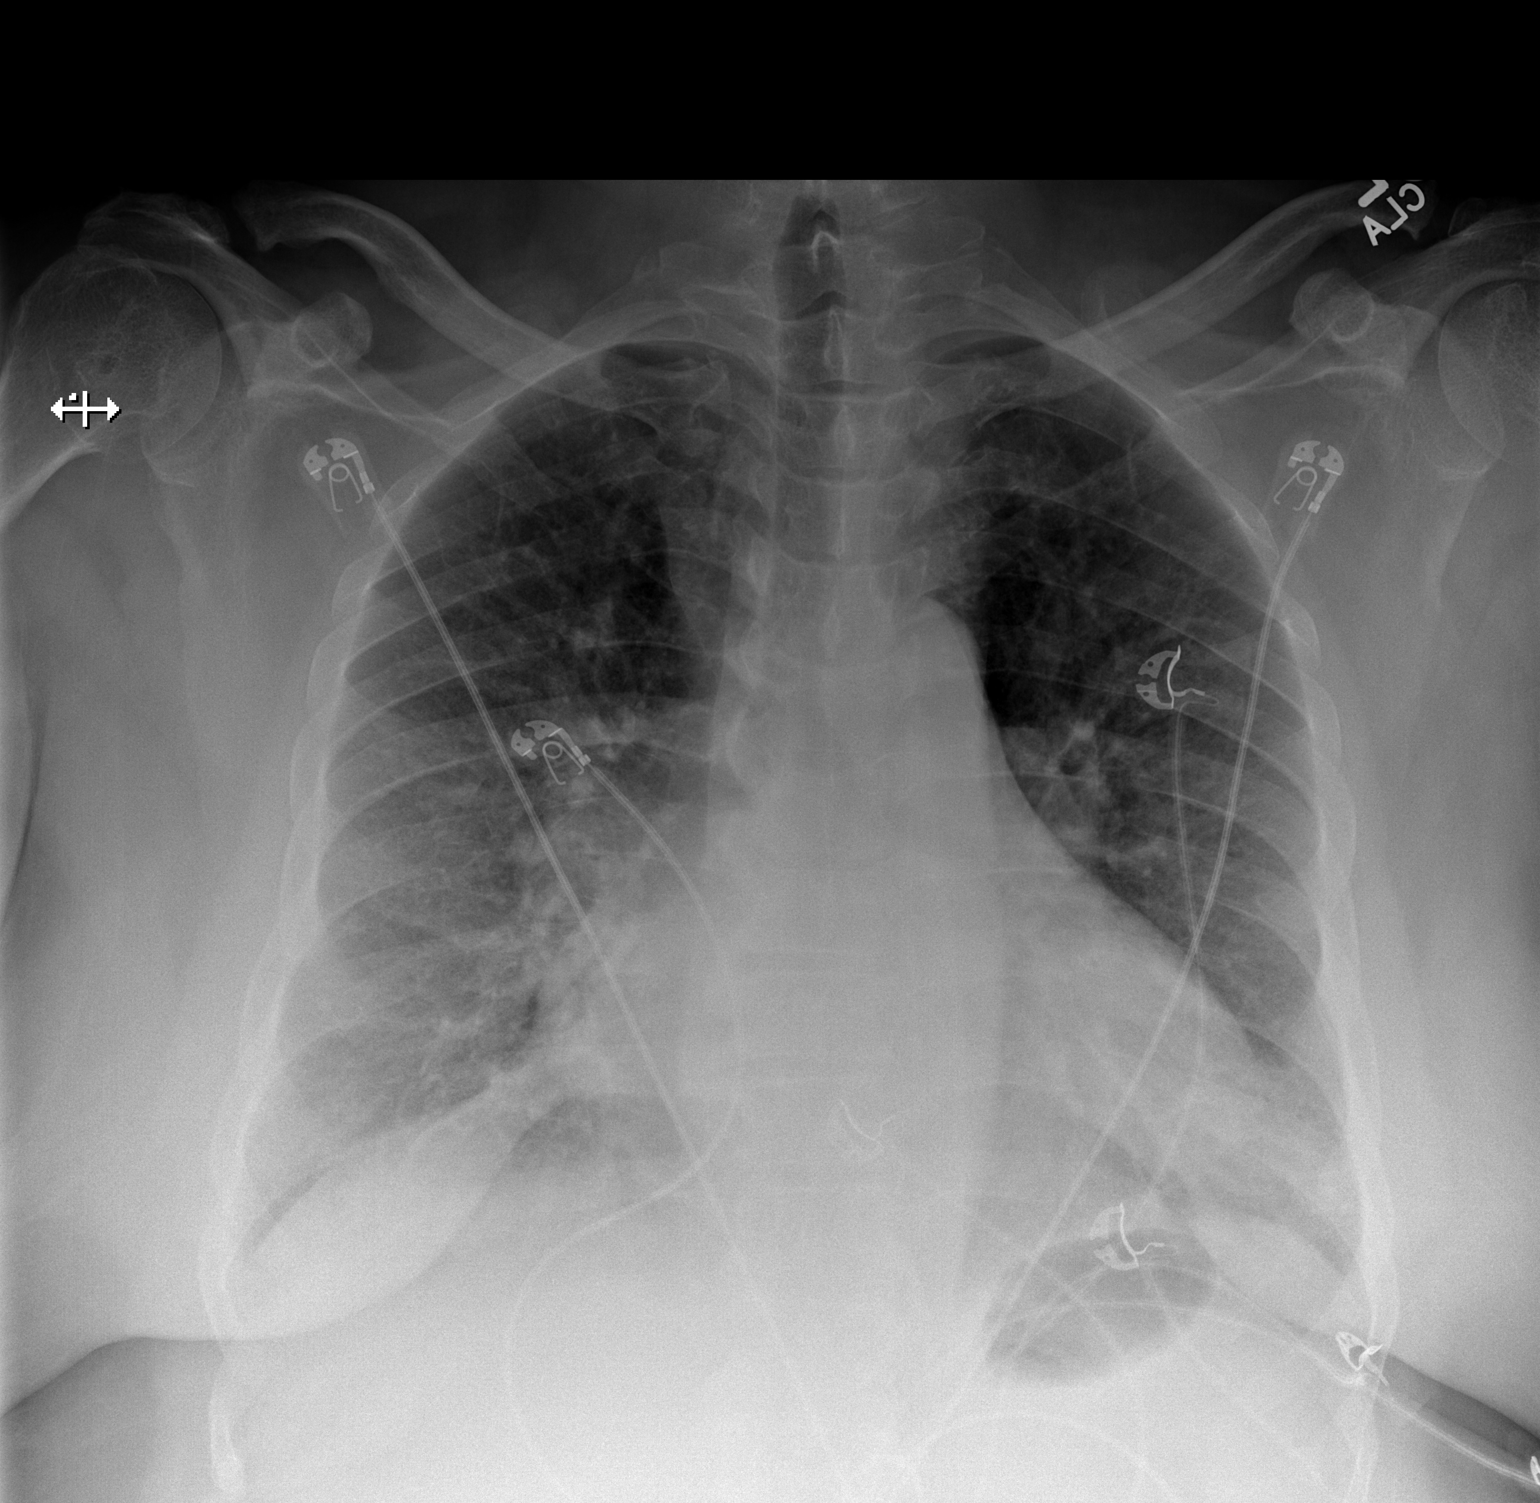

[w chest lat]
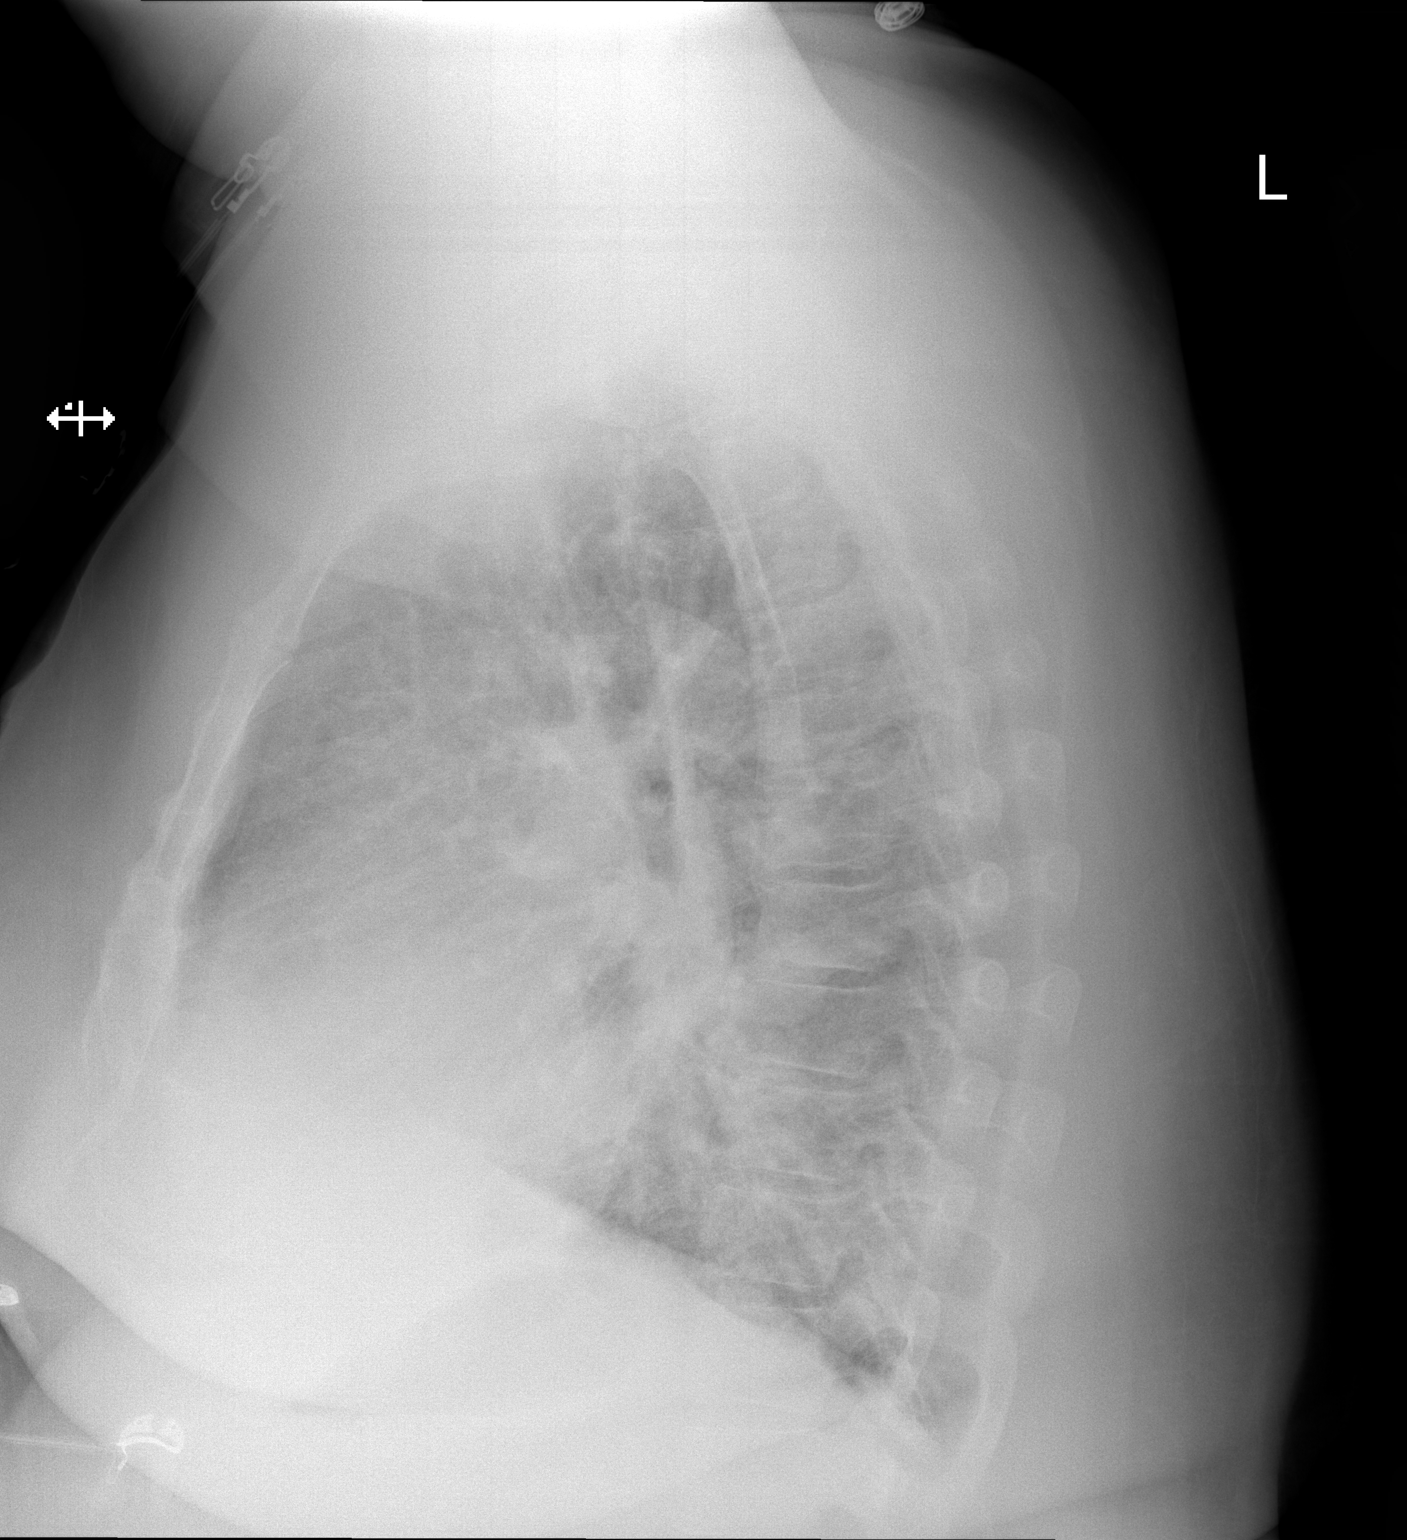

[2 of 2 positions shown; findings below may reference images not displayed]

FINDINGS: There is no evidence of acute infiltrate, pleural effusion or
pneumothorax. Mild prominence of the perihilar pulmonary vasculature
is seen. There is stable mild to moderate severity enlargement of
the cardiac silhouette. The visualized skeletal structures are
unremarkable.
IMPRESSION: Cardiomegaly with mild pulmonary vascular congestion.

## 2021-05-08 ENCOUNTER — Other Ambulatory Visit: Payer: Medicare HMO | Admitting: *Deleted

## 2021-05-08 NOTE — Patient Outreach (Signed)
[facility]) Care Management  05/08/2021  Juan Snyder 1966-04-06 [id number]  Unsuccessful outreach attempt made to patient. RN Health Coach left HIPAA compliant voicemail message along with her contact information.  Plan: RN Health Coach will call patient within the next several business days and will send patient an unsuccessful letter.   [name] RN, BSN [facility] Care Management  RN Health Coach [phone] [email or web address]

## 2021-05-12 ENCOUNTER — Other Ambulatory Visit: Payer: Self-pay | Admitting: *Deleted

## 2021-05-12 NOTE — Patient Outreach (Signed)
[facility]) Care Management  05/12/2021  Juan Snyder Jun 17, 1966 [id number]  Unsuccessful outreach attempt made to patient. RN Health Coach left HIPAA compliant voicemail message along with her contact information.  Plan: RN Health Coach will call patient within the next several business days.  [name] RN, BSN [facility] Care Management  RN Health Coach [phone] [name]

## 2021-05-15 ENCOUNTER — Other Ambulatory Visit: Payer: Self-pay | Admitting: *Deleted

## 2021-05-15 NOTE — Patient Outreach (Signed)
[facility]) Care Management  05/15/2021  Juan Snyder 07/21/1966 [id number]  Unsuccessful outreach attempt made to patient for #3 screening call. RN Health Coach left HIPAA compliant voicemail message along with her contact information.  Plan: RN Health Coach will call patient within the month of August.   [name] RN, BSN [facility] Care Management  RN Health Coach [phone] [email or web address]

## 2021-05-31 ENCOUNTER — Other Ambulatory Visit: Payer: Self-pay

## 2021-05-31 ENCOUNTER — Encounter: Payer: Self-pay | Admitting: Cardiology

## 2021-05-31 ENCOUNTER — Ambulatory Visit: Payer: Medicare HMO | Admitting: Cardiology

## 2021-05-31 VITALS — BP 170/99 | HR 65 | Temp 98.0°F | Resp 16 | Ht 68.0 in | Wt 292.0 lb

## 2021-05-31 DIAGNOSIS — I480 Paroxysmal atrial fibrillation: Secondary | ICD-10-CM

## 2021-05-31 DIAGNOSIS — I10 Essential (primary) hypertension: Secondary | ICD-10-CM

## 2021-05-31 MED ORDER — AMLODIPINE BESYLATE 5 MG PO TABS: 5.0000 mg | ORAL_TABLET | Freq: Every day | ORAL | 3 refills | Status: DC

## 2021-05-31 NOTE — Progress Notes (Signed)
Patient is here for follow up visit.  Subjective:   Juan Snyder, male    DOB: 10/17/1966, 55 y.o.   MRN: [id number]   Chief Complaint  Patient presents with   Congestive Heart Failure   Hypertension   Follow-up    3 month     HPI  55 year old African-American maleerican male with nonischemic cardiomyopathy, 07/2018, paroxysmal atrial fibrillation/flutter status post successful cardioversion 08/13/2018,  not a candidate for ablation due severe LAA enlargement, hypertension, type II diabetes mellitus, morbid obesity, obstructive sleep apnea on CPAP.  Patient has recently had stressful arguments at home with his children. Subsequently, he started experiencing shortness of breath. He denies chest pain.   Current Outpatient Medications on File Prior to Visit  Medication Sig Dispense Refill   albuterol (PROVENTIL HFA;VENTOLIN HFA) 108 (90 Base) MCG/ACT inhaler Inhale 1 puff into the lungs every 6 (six) hours as needed for wheezing or shortness of breath.     amiodarone (PACERONE) 200 MG tablet TAKE 1 TABLET BY MOUTH EVERY DAY 90 tablet 1   ENTRESTO 97-103 MG TAKE 1 TABLET BY MOUTH TWICE A DAY (Patient taking differently: Take 1 tablet by mouth 2 (two) times daily.) 60 tablet 3   fluticasone (FLONASE) 50 MCG/ACT nasal spray Place 1 spray into both nostrils daily as needed for allergies.      furosemide (LASIX) 40 MG tablet Take 1 tablet (40 mg total) by mouth 2 (two) times daily. One tablet in morning, Additional one tablet in the afternoon for leg swelling, weight gain,as needed (Patient taking differently: Take 40 mg by mouth 2 (two) times daily. Additional 1 tablet in the morning if needed.) 120 tablet 1   magnesium oxide (MAG-OX) 400 MG tablet Take 200 mg by mouth 2 (two) times daily.     metFORMIN (GLUCOPHAGE) 500 MG tablet TAKE 1 TABLET BY MOUTH 2 TIMES DAILY WITH A MEAL. 180 tablet 1   methocarbamol (ROBAXIN) 750 MG tablet Take 750 mg by mouth every 8 (eight) hours as needed for muscle  spasms.     metoprolol succinate (TOPROL-XL) 100 MG 24 hr tablet Take 1 tablet (100 mg total) by mouth daily. Take with or immediately following a meal. (Patient taking differently: Take 100 mg by mouth daily.) 30 tablet 0   Multiple Vitamins-Minerals (MULTIVITAMIN PO) Take 1 tablet by mouth daily.     oxyCODONE-acetaminophen (PERCOCET) 10-325 MG tablet Take 1 tablet by mouth every 4 (four) hours as needed for pain.     potassium chloride 20 MEQ TBCR Take 10 mEq by mouth daily. 60 tablet 2   tadalafil (CIALIS) 20 MG tablet Take 20 mg by mouth daily as needed for erectile dysfunction.      tamsulosin (FLOMAX) 0.4 MG CAPS capsule Take 0.4 mg by mouth daily.      traMADol (ULTRAM) 50 MG tablet Take 1 tablet (50 mg total) by mouth every 6 (six) hours as needed. 20 tablet 0   XARELTO 20 MG TABS tablet TAKE 1 TABLET BY MOUTH EVERY DAY (Patient taking differently: Take 20 mg by mouth daily.) 90 tablet 3   No current facility-administered medications on file prior to visit.     He has upcoming hernia surgery on 03/23/2021  Recent labs: 03/23/2021: Glucose 142, BUN/Cr 17/1.2. EGFR ?. Na/K 138/5.1.  H/H 13/39.  12/28/2020: Glucose 146, BUN/Cr 16/1.17. EGFR >60. Na/K 137/4.4. T. Bili 1.3. Rest of the CMP normal H/H 13/40. MCV 83. Platelets 249   06/10/2020: Glucose 99, BUN/Cr 16/1.29. EGFR >  60. Na/K 138/3.6.  H/H 11/34. MCV 85. Platelets 139  Results for Juan Snyder (MRN [id number]) as of 06/20/2020 12:49  Ref. Range 06/07/2020 23:23 06/08/2020 04:27 06/09/2020 08:57  B Natriuretic Peptide Latest Ref Range: 0.0 - 100.0 pg/mL 831.9 (H) 996.6 (H) 690.3 (H)    08/2019-01/2020: BUN/Cr 14/1.1. EGFR 73 HbA1C 6.9% Chol 156, TG 215, HDL 52, LDL 88 TSH 1.7 normal (12/2018)   08/14/2018: Glucose 204, BUN/Cr 20/1.08. EGFR >60. Na/K 137/4.5.  H/H 10.6/32.7. MCV 83. Platelets 257 HbA1C 7.5% TSH 1.2 normal  Cardiovascular studies:  EKG 05/31/2021: Sinus rhythm 64 bpm First degree AV  block IVCD  Echocardiogram 05/05/2020:  Left ventricle cavity is normal in size. Moderate concentric hypertrophy  of the left ventricle. Mild global hypokinesis. LVEF 40-45%. Diastolic  function indeterminate. Elevated LAP.  Left atrial cavity is severely dilated.  Moderate (Grade II) mitral regurgitation.  Normal right atrial pressure.  Compared to previous study on 04/21/2019, pulmonary hypertension now absent   EKG 01/27/2020: Sinus rhythm 53 bpm.  First degree AV block. Nonspecific T-abnormality.   EKG 09/24/2018: Sinus rhythm 80 bpm. First degree AV block. Left atrial enlargement. Incomplete RBBB.  R&LHC 08/12/2018: Left dominant circulation with no significant CAD Mildly elevated LVEDP (17 mmHg) Moderate WHO Grp II PH (Mean PA 33 mmHg) Mildly decompensated arrhythmia induced nonischemic cardiomyopathy. Continue guideline directed heart failure therapy, and management of Afib.  Recent labs: 06/10/2020: Glucose 99, BUN/Cr 16/1.29. EGFR >60. Na/K 138/3.6.  H/H 11/34. MCV 85. Platelets 129  Review of Systems  Cardiovascular:  Positive for dyspnea on exertion. Negative for chest pain, leg swelling, palpitations and syncope.  Respiratory:  Positive for shortness of breath.       Objective:    Vitals:   05/31/21 1415 05/31/21 1423  BP: (!) 192/110 (!) 170/99  Pulse: 83 65  Resp: 16   Temp: 98 F (36.7 C)   SpO2: 90%        Physical Exam Vitals and nursing note reviewed.  Constitutional:      General: He is not in acute distress.    Appearance: He is obese.  Neck:     Vascular: No JVD.  Cardiovascular:     Rate and Rhythm: Normal rate and regular rhythm.     Heart sounds: Normal heart sounds. No murmur heard. Pulmonary:     Effort: Pulmonary effort is normal.     Breath sounds: Normal breath sounds. No wheezing or rales.  Musculoskeletal:     Right lower leg: No edema.     Left lower leg: No edema.       Assessment & Recommendations:   55 year old  African-American maleerican male with nonischemic cardiomyopathy, 07/2018, paroxysmal atrial fibrillation/flutter, hypertension, type II diabetes mellitus, morbid obesity, obstructive sleep apnea on CPAP.  Nonscehmic cardiomyopathy: EF 40-45%.(04/2020) Recent increase in dyspnea likely related to hypertension and stress. Currently on Entresto to 97-103 mg bid,  metoprolol succinate 100 mg daily. Did not tolerate spironolactone due to gynacomastia Continue lasix 40 mg 1-2 tab/day Added amlodipine 5 mg for hypertension  Paroxysmal Afib/flutter: Successful cardioversion 07/2018,  not a candidate for ablation due severe LAA enlargement Maintianing sinus rhythm. Continue metoprolol succiante 100 mg daily, amiodarone 200 mg bid.  CHA2DS2VASc score 3, annual stroke risk 3.6% Continue Xarelto 20 mg daily. Continue management of OSA with CPAP, weight loss.  Hypertension: Elevated. Added amlodipine 5 mg.  OSA: Severe, on CPAP.   Type II DM: Managed by PCP.  F/u in 4 weeks  Morrison Mcbryar  Esther Hardy, MD [facility] Cardiovascular. [place] Pager: [phone] Office: [phone] If no answer Cell [phone]

## 2021-06-01 DIAGNOSIS — I10 Essential (primary) hypertension: Secondary | ICD-10-CM | POA: Diagnosis not present

## 2021-06-01 DIAGNOSIS — I119 Hypertensive heart disease without heart failure: Secondary | ICD-10-CM | POA: Diagnosis not present

## 2021-06-01 DIAGNOSIS — J449 Chronic obstructive pulmonary disease, unspecified: Secondary | ICD-10-CM | POA: Diagnosis not present

## 2021-06-01 DIAGNOSIS — J302 Other seasonal allergic rhinitis: Secondary | ICD-10-CM | POA: Diagnosis not present

## 2021-06-01 DIAGNOSIS — E559 Vitamin D deficiency, unspecified: Secondary | ICD-10-CM | POA: Diagnosis not present

## 2021-06-01 DIAGNOSIS — E782 Mixed hyperlipidemia: Secondary | ICD-10-CM | POA: Diagnosis not present

## 2021-06-01 DIAGNOSIS — E1165 Type 2 diabetes mellitus with hyperglycemia: Secondary | ICD-10-CM | POA: Diagnosis not present

## 2021-06-01 DIAGNOSIS — Z0001 Encounter for general adult medical examination with abnormal findings: Secondary | ICD-10-CM | POA: Diagnosis not present

## 2021-06-08 ENCOUNTER — Ambulatory Visit: Payer: Medicare HMO | Admitting: Cardiology

## 2021-06-26 ENCOUNTER — Other Ambulatory Visit: Payer: Self-pay | Admitting: *Deleted

## 2021-06-26 NOTE — Patient Outreach (Signed)
[facility]) Care Management  06/26/2021  Juan Snyder 1965/11/28 [id number]  4 attempts to establish contact with patient for screening without success. No response from letter mailed to patient. Case is being closed at this time. RN Health Coach left HIPAA compliant message along with her contact information.  Plan: RN Health Coach will close case.  [name] RN, BSN [facility] Care Management  RN Health Coach [phone] [email or web address]

## 2021-06-28 NOTE — Progress Notes (Signed)
Rescheduled

## 2021-06-29 ENCOUNTER — Ambulatory Visit: Payer: Medicare HMO | Admitting: Cardiology

## 2021-06-29 DIAGNOSIS — I480 Paroxysmal atrial fibrillation: Secondary | ICD-10-CM

## 2021-06-29 DIAGNOSIS — E1165 Type 2 diabetes mellitus with hyperglycemia: Secondary | ICD-10-CM | POA: Diagnosis not present

## 2021-06-29 DIAGNOSIS — E559 Vitamin D deficiency, unspecified: Secondary | ICD-10-CM | POA: Diagnosis not present

## 2021-06-29 DIAGNOSIS — I5022 Chronic systolic (congestive) heart failure: Secondary | ICD-10-CM

## 2021-06-29 DIAGNOSIS — E782 Mixed hyperlipidemia: Secondary | ICD-10-CM | POA: Diagnosis not present

## 2021-06-29 DIAGNOSIS — J302 Other seasonal allergic rhinitis: Secondary | ICD-10-CM | POA: Diagnosis not present

## 2021-06-29 DIAGNOSIS — I119 Hypertensive heart disease without heart failure: Secondary | ICD-10-CM | POA: Diagnosis not present

## 2021-06-29 DIAGNOSIS — I10 Essential (primary) hypertension: Secondary | ICD-10-CM | POA: Diagnosis not present

## 2021-06-29 DIAGNOSIS — J449 Chronic obstructive pulmonary disease, unspecified: Secondary | ICD-10-CM | POA: Diagnosis not present

## 2021-07-18 IMAGING — CT CT ABD-PELV W/ CM
2 of 5 series · 16 of 46 positions shown, 18 images · IV contrast (OMNIPAQUE 300)
Comparison: None.

CLINICAL DATA: Abdominal pain worse on the right. Symptoms over the
last week.

EXAM:
CT ABDOMEN AND PELVIS WITH CONTRAST
TECHNIQUE: Multidetector CT imaging of the abdomen and pelvis was performed
using the standard protocol following bolus administration of
intravenous contrast.
CONTRAST:  100mL OMNIPAQUE IOHEXOL 300 MG/ML  SOLN

[Series 2: axial st · axial · 0.92mm/px · z∈[+919,+1319]mm · 13 of 94 slices shown, 15 images]
[im 7/94  soft-tissue]
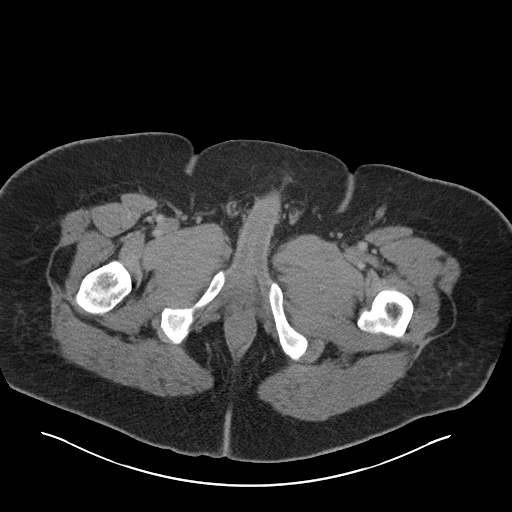
[im 7/94  bone]
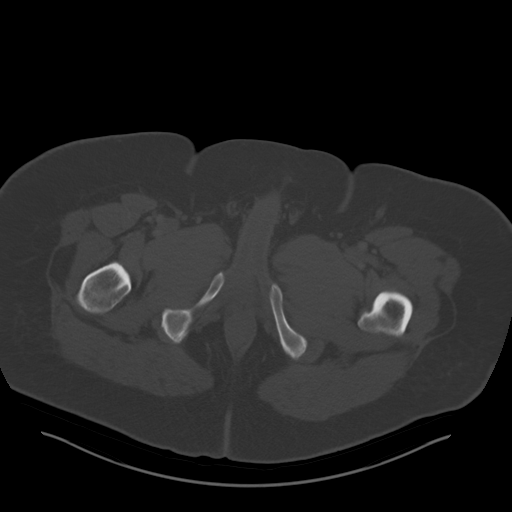
[im 14/94  soft-tissue]
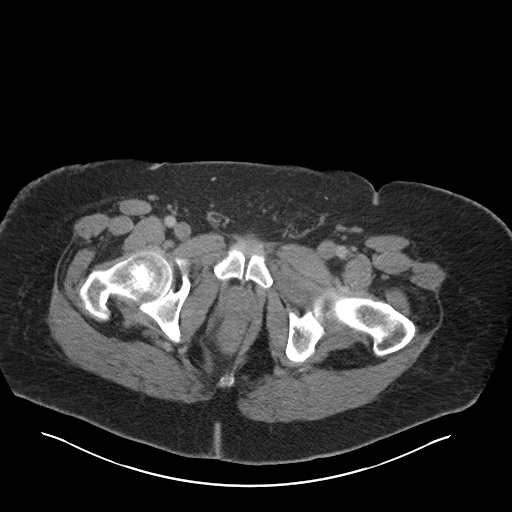
[im 20/94  soft-tissue]
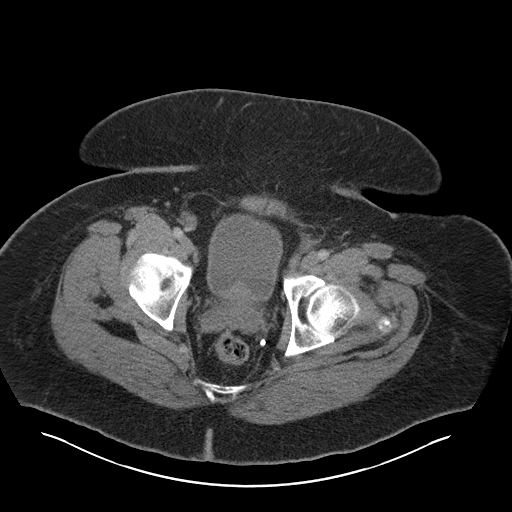
[im 27/94  soft-tissue]
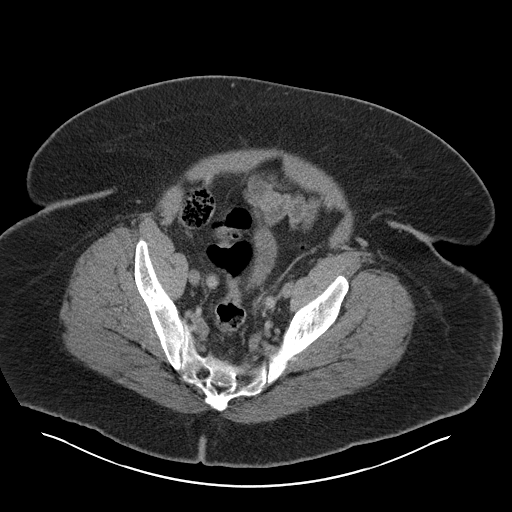
[im 34/94  soft-tissue]
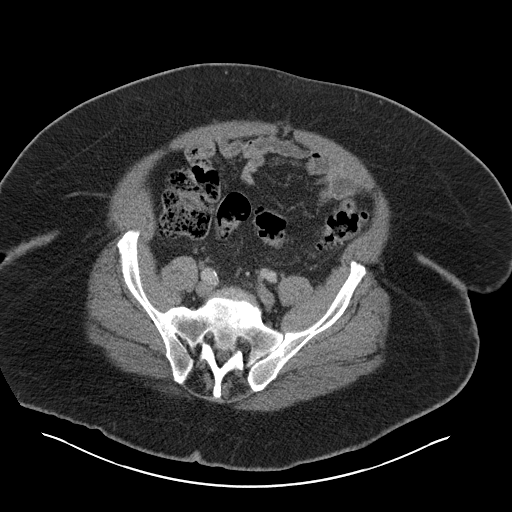
[im 40/94  soft-tissue]
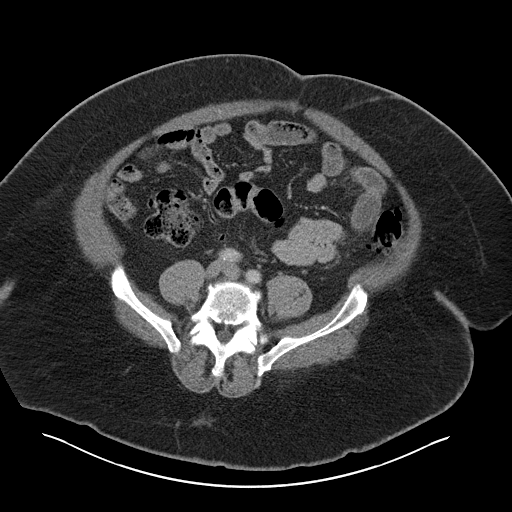
[im 47/94  soft-tissue]
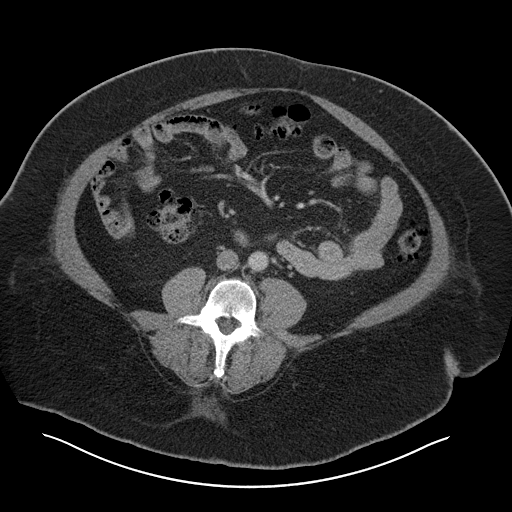
[im 54/94  soft-tissue]
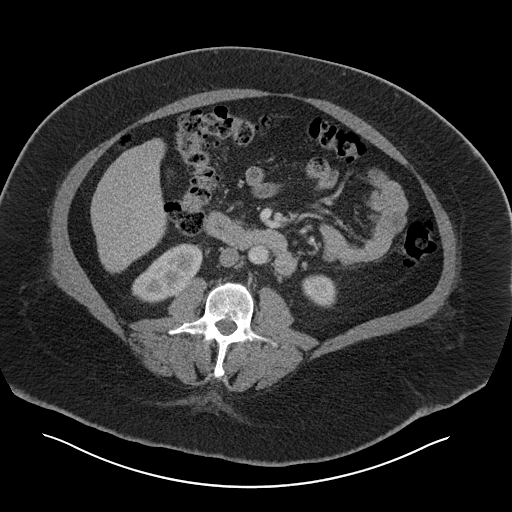
[im 60/94  soft-tissue]
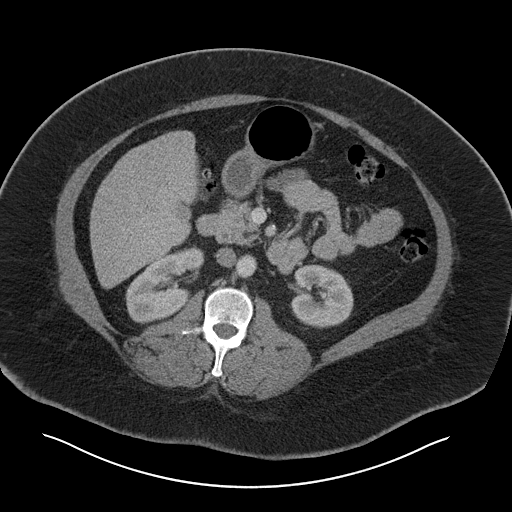
[im 60/94  bone]
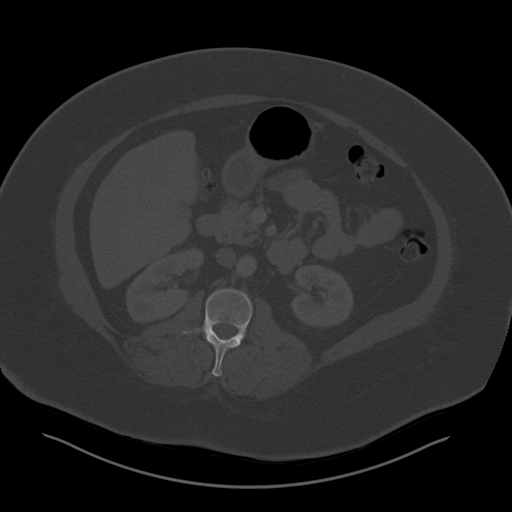
[im 67/94  soft-tissue]
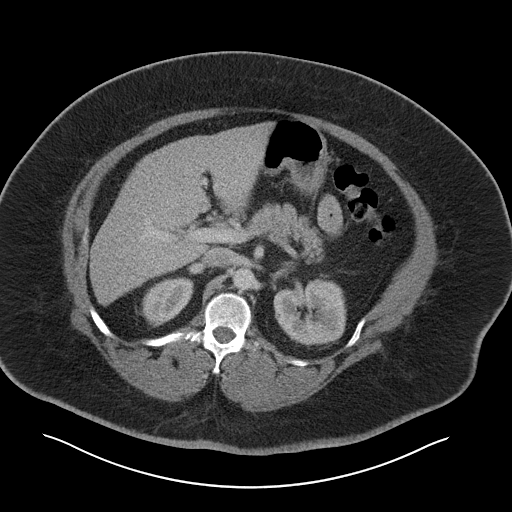
[im 74/94  soft-tissue]
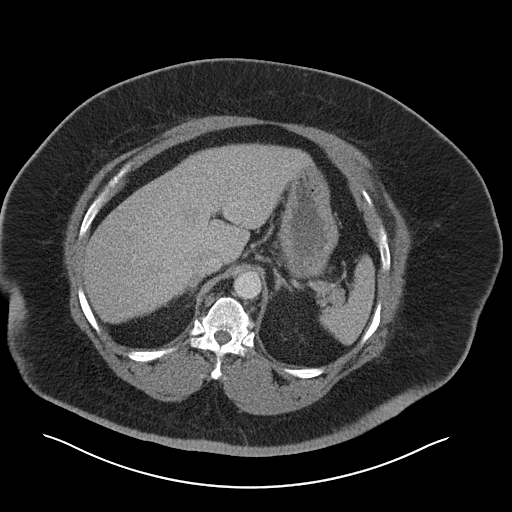
[im 80/94  soft-tissue]
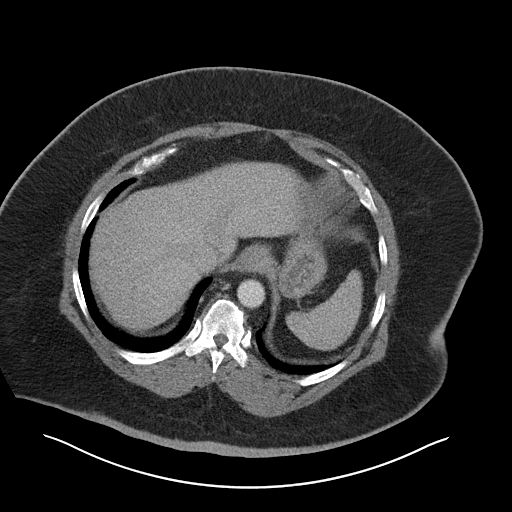
[im 87/94  soft-tissue]
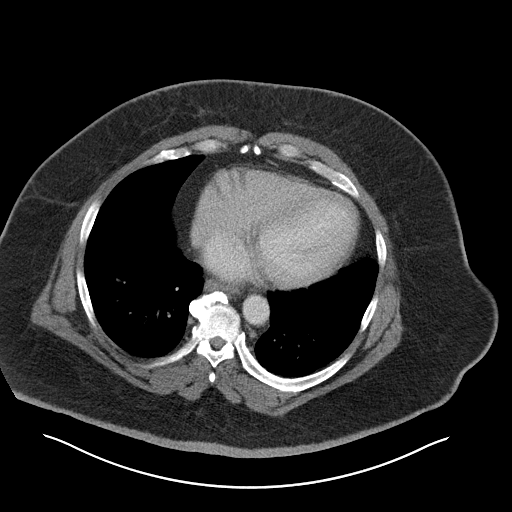

[Series 4: coronal st · coronal · 0.96mm/px · 3 of 212 slices shown]
[im 71/212  soft-tissue]
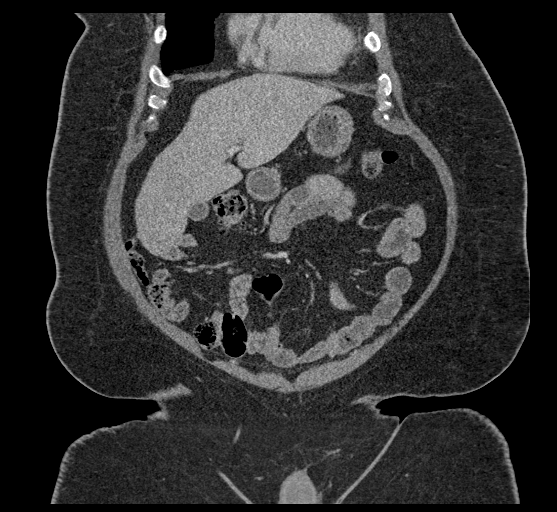
[im 94/212  soft-tissue]
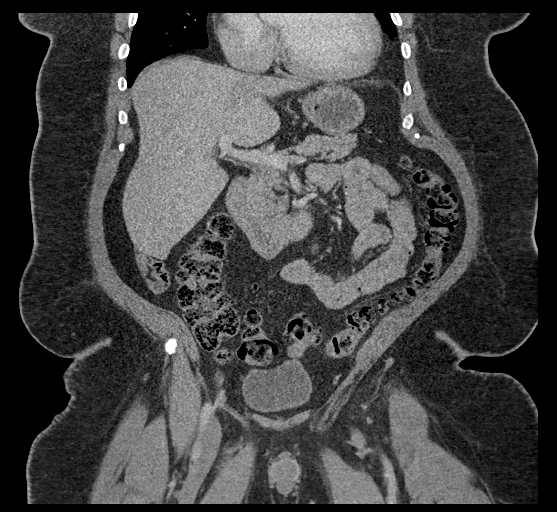
[im 118/212  soft-tissue]
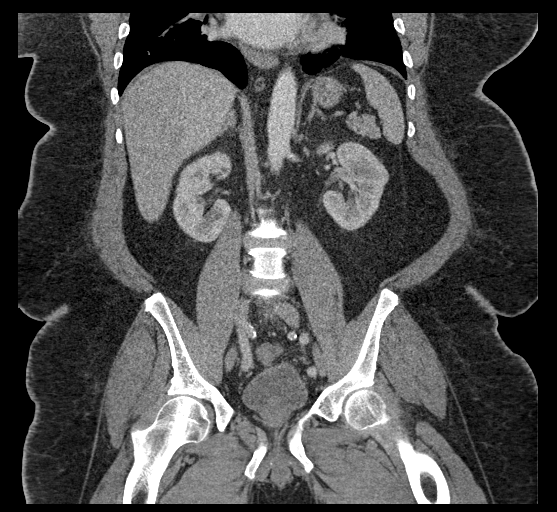

[16 of 46 positions shown; findings below may reference images not displayed]

FINDINGS: Lower chest: No infiltrate, collapse or effusion.

Hepatobiliary: No liver parenchymal lesion. No calcified gallstones.
No ductal dilatation.

Pancreas: Normal

Spleen: Normal

Adrenals/Urinary Tract: Adrenal glands are normal. Kidneys are
normal. No cyst, mass, stone or hydronephrosis. Bladder is normal.

Stomach/Bowel: Stomach and small intestine are normal. The appendix
is normal. There is diverticulosis of the left colon but no evidence
of diverticulitis.

Vascular/Lymphatic: Minimal aortic atherosclerotic calcification.
IVC is normal. No adenopathy.

Reproductive: Normal

Other: No free fluid or air. Small right inguinal hernia containing
only fat.

Musculoskeletal: Ordinary lower lumbar degenerative changes.
IMPRESSION: 1. No acute finding. No cause of the presenting symptoms is
identified. Normal appearing appendix.
2. Diverticulosis of the left colon without evidence of
diverticulitis.
3. Small right inguinal hernia containing only fat.

## 2021-07-19 DIAGNOSIS — N5201 Erectile dysfunction due to arterial insufficiency: Secondary | ICD-10-CM | POA: Diagnosis not present

## 2021-08-02 ENCOUNTER — Telehealth: Payer: Self-pay | Admitting: Cardiology

## 2021-08-02 ENCOUNTER — Ambulatory Visit: Payer: Medicare HMO | Admitting: Cardiology

## 2021-08-06 ENCOUNTER — Other Ambulatory Visit: Payer: Self-pay | Admitting: Cardiology

## 2021-08-28 DIAGNOSIS — I10 Essential (primary) hypertension: Secondary | ICD-10-CM | POA: Diagnosis not present

## 2021-08-28 DIAGNOSIS — E119 Type 2 diabetes mellitus without complications: Secondary | ICD-10-CM | POA: Diagnosis not present

## 2021-08-28 DIAGNOSIS — M109 Gout, unspecified: Secondary | ICD-10-CM | POA: Diagnosis not present

## 2021-08-28 DIAGNOSIS — I509 Heart failure, unspecified: Secondary | ICD-10-CM | POA: Diagnosis not present

## 2021-08-28 DIAGNOSIS — I4891 Unspecified atrial fibrillation: Secondary | ICD-10-CM | POA: Diagnosis not present

## 2021-08-28 DIAGNOSIS — M169 Osteoarthritis of hip, unspecified: Secondary | ICD-10-CM | POA: Diagnosis not present

## 2021-09-21 DIAGNOSIS — J302 Other seasonal allergic rhinitis: Secondary | ICD-10-CM | POA: Diagnosis not present

## 2021-09-21 DIAGNOSIS — G4489 Other headache syndrome: Secondary | ICD-10-CM | POA: Diagnosis not present

## 2021-09-21 DIAGNOSIS — Z23 Encounter for immunization: Secondary | ICD-10-CM | POA: Diagnosis not present

## 2021-09-21 DIAGNOSIS — I119 Hypertensive heart disease without heart failure: Secondary | ICD-10-CM | POA: Diagnosis not present

## 2021-09-21 DIAGNOSIS — E1165 Type 2 diabetes mellitus with hyperglycemia: Secondary | ICD-10-CM | POA: Diagnosis not present

## 2021-09-21 DIAGNOSIS — E782 Mixed hyperlipidemia: Secondary | ICD-10-CM | POA: Diagnosis not present

## 2021-09-21 DIAGNOSIS — I10 Essential (primary) hypertension: Secondary | ICD-10-CM | POA: Diagnosis not present

## 2021-09-21 DIAGNOSIS — E559 Vitamin D deficiency, unspecified: Secondary | ICD-10-CM | POA: Diagnosis not present

## 2021-09-21 DIAGNOSIS — J449 Chronic obstructive pulmonary disease, unspecified: Secondary | ICD-10-CM | POA: Diagnosis not present

## 2021-09-21 DIAGNOSIS — R69 Illness, unspecified: Secondary | ICD-10-CM | POA: Diagnosis not present

## 2021-09-26 DIAGNOSIS — H5210 Myopia, unspecified eye: Secondary | ICD-10-CM | POA: Diagnosis not present

## 2021-09-27 DIAGNOSIS — G4489 Other headache syndrome: Secondary | ICD-10-CM | POA: Diagnosis not present

## 2021-09-27 DIAGNOSIS — E1165 Type 2 diabetes mellitus with hyperglycemia: Secondary | ICD-10-CM | POA: Diagnosis not present

## 2021-09-27 DIAGNOSIS — J302 Other seasonal allergic rhinitis: Secondary | ICD-10-CM | POA: Diagnosis not present

## 2021-09-27 DIAGNOSIS — I10 Essential (primary) hypertension: Secondary | ICD-10-CM | POA: Diagnosis not present

## 2021-09-27 DIAGNOSIS — E782 Mixed hyperlipidemia: Secondary | ICD-10-CM | POA: Diagnosis not present

## 2021-09-27 DIAGNOSIS — I119 Hypertensive heart disease without heart failure: Secondary | ICD-10-CM | POA: Diagnosis not present

## 2021-09-27 DIAGNOSIS — E559 Vitamin D deficiency, unspecified: Secondary | ICD-10-CM | POA: Diagnosis not present

## 2021-09-27 DIAGNOSIS — J449 Chronic obstructive pulmonary disease, unspecified: Secondary | ICD-10-CM | POA: Diagnosis not present

## 2021-09-27 DIAGNOSIS — R69 Illness, unspecified: Secondary | ICD-10-CM | POA: Diagnosis not present

## 2021-10-03 ENCOUNTER — Ambulatory Visit: Payer: Medicare HMO | Admitting: Podiatry

## 2021-10-05 DIAGNOSIS — G4733 Obstructive sleep apnea (adult) (pediatric): Secondary | ICD-10-CM | POA: Diagnosis not present

## 2021-10-06 ENCOUNTER — Ambulatory Visit: Payer: Medicare HMO | Admitting: Cardiology

## 2021-10-06 DIAGNOSIS — H109 Unspecified conjunctivitis: Secondary | ICD-10-CM | POA: Diagnosis not present

## 2021-10-09 DIAGNOSIS — L03211 Cellulitis of face: Secondary | ICD-10-CM | POA: Diagnosis not present

## 2021-10-09 DIAGNOSIS — H1032 Unspecified acute conjunctivitis, left eye: Secondary | ICD-10-CM | POA: Diagnosis not present

## 2021-10-10 DIAGNOSIS — H1030 Unspecified acute conjunctivitis, unspecified eye: Secondary | ICD-10-CM | POA: Diagnosis not present

## 2021-10-11 ENCOUNTER — Ambulatory Visit: Payer: Medicare HMO | Admitting: Podiatry

## 2021-10-21 ENCOUNTER — Other Ambulatory Visit: Payer: Self-pay | Admitting: Cardiology

## 2021-10-21 DIAGNOSIS — I10 Essential (primary) hypertension: Secondary | ICD-10-CM

## 2021-10-23 ENCOUNTER — Other Ambulatory Visit: Payer: Self-pay

## 2021-10-23 ENCOUNTER — Encounter: Payer: Self-pay | Admitting: Cardiology

## 2021-10-23 ENCOUNTER — Ambulatory Visit: Payer: Medicare HMO | Admitting: Cardiology

## 2021-10-23 VITALS — BP 142/75 | HR 66 | Temp 98.1°F | Resp 17 | Ht 68.0 in | Wt 301.2 lb

## 2021-10-23 DIAGNOSIS — I10 Essential (primary) hypertension: Secondary | ICD-10-CM

## 2021-10-23 DIAGNOSIS — I5022 Chronic systolic (congestive) heart failure: Secondary | ICD-10-CM

## 2021-10-23 DIAGNOSIS — I480 Paroxysmal atrial fibrillation: Secondary | ICD-10-CM | POA: Diagnosis not present

## 2021-10-23 NOTE — Progress Notes (Addendum)
Patient is here for follow up visit.  Subjective:   Juan Snyder, male    DOB: Dec 08, 1965, 55 y.o.   MRN: [id number]   Chief Complaint  Patient presents with   Atrial Fibrillation   Follow-up    4 weeks   Hypertension     HPI  55 year old African-American male with nonischemic cardiomyopathy, 07/2018, paroxysmal atrial fibrillation/flutter status post successful cardioversion 08/13/2018,  not a candidate for ablation due severe LAA enlargement, hypertension, type II diabetes mellitus, morbid obesity, obstructive sleep apnea on CPAP.  Patient denies any exertional dyspnea, leg edema symptoms.  His physical activity is limited due to his back pain.  In spite of that, he tries to walk on treadmill without any significant symptoms.  Blood pressure is elevated today.  Today, he tells me that he is taking amlodipine in combination with another medication.  This does not reflect on his medication list, also verified with pharmacy.  On a separate note, he is trying to lose weight.  He is hoping to have medical or surgical treatment for his obesity.  Current Outpatient Medications on File Prior to Visit  Medication Sig Dispense Refill   albuterol (PROVENTIL HFA;VENTOLIN HFA) 108 (90 Base) MCG/ACT inhaler Inhale 1 puff into the lungs every 6 (six) hours as needed for wheezing or shortness of breath.     amiodarone (PACERONE) 200 MG tablet TAKE 1 TABLET BY MOUTH EVERY DAY 90 tablet 1   amLODipine (NORVASC) 5 MG tablet Take 1 tablet (5 mg total) by mouth daily. 30 tablet 3   ENTRESTO 97-103 MG TAKE 1 TABLET BY MOUTH TWICE A DAY (Patient taking differently: Take 1 tablet by mouth 2 (two) times daily.) 60 tablet 3   fluticasone (FLONASE) 50 MCG/ACT nasal spray Place 1 spray into both nostrils daily as needed for allergies.      furosemide (LASIX) 40 MG tablet Take 1 tablet (40 mg total) by mouth 2 (two) times daily. One tablet in morning, Additional one tablet in the afternoon for leg  swelling, weight gain,as needed (Patient taking differently: Take 40 mg by mouth 2 (two) times daily. Additional 1 tablet in the morning if needed.) 120 tablet 1   magnesium oxide (MAG-OX) 400 MG tablet Take 200 mg by mouth 2 (two) times daily.     metFORMIN (GLUCOPHAGE) 500 MG tablet TAKE 1 TABLET BY MOUTH 2 TIMES DAILY WITH A MEAL. 180 tablet 1   methocarbamol (ROBAXIN) 750 MG tablet Take 750 mg by mouth every 8 (eight) hours as needed for muscle spasms.     metoprolol succinate (TOPROL-XL) 100 MG 24 hr tablet Take 1 tablet (100 mg total) by mouth daily. Take with or immediately following a meal. (Patient taking differently: Take 100 mg by mouth daily.) 30 tablet 0   Multiple Vitamins-Minerals (MULTIVITAMIN PO) Take 1 tablet by mouth daily.     oxyCODONE-acetaminophen (PERCOCET) 10-325 MG tablet Take 1 tablet by mouth every 4 (four) hours as needed for pain.     potassium chloride 20 MEQ TBCR Take 10 mEq by mouth daily. 60 tablet 2   tadalafil (CIALIS) 20 MG tablet Take 20 mg by mouth daily as needed for erectile dysfunction.      tamsulosin (FLOMAX) 0.4 MG CAPS capsule Take 0.4 mg by mouth daily.      traMADol (ULTRAM) 50 MG tablet Take 1 tablet (50 mg total) by mouth every 6 (six) hours as needed. 20 tablet 0   XARELTO 20 MG TABS tablet TAKE  1 TABLET BY MOUTH EVERY DAY 90 tablet 3   No current facility-administered medications on file prior to visit.    Cardiovascular studies:  EKG 10/23/2021: Sinus rhythm 70 bpm  Nonspecific T-abnormality  Echocardiogram 05/05/2020:  Left ventricle cavity is normal in size. Moderate concentric hypertrophy  of the left ventricle. Mild global hypokinesis. LVEF 40-45%. Diastolic  function indeterminate. Elevated LAP.  Left atrial cavity is severely dilated.  Moderate (Grade II) mitral regurgitation.  Normal right atrial pressure.  Compared to previous study on 04/21/2019, pulmonary hypertension now absent   EKG 01/27/2020: Sinus rhythm 53 bpm.  First  degree AV block. Nonspecific T-abnormality.   EKG 09/24/2018: Sinus rhythm 80 bpm. First degree AV block. Left atrial enlargement. Incomplete RBBB.  R&LHC 08/12/2018: Left dominant circulation with no significant CAD Mildly elevated LVEDP (17 mmHg) Moderate WHO Grp II PH (Mean PA 33 mmHg) Mildly decompensated arrhythmia induced nonischemic cardiomyopathy. Continue guideline directed heart failure therapy, and management of Afib.   Recent labs: 03/23/2021: Glucose 142, BUN/Cr 17/1.2. EGFR ?. Na/K 138/5.1.  H/H 13/39.  12/28/2020: Glucose 146, BUN/Cr 16/1.17. EGFR >60. Na/K 137/4.4. T. Bili 1.3. Rest of the CMP normal H/H 13/40. MCV 83. Platelets 249   06/10/2020: Glucose 99, BUN/Cr 16/1.29. EGFR >60. Na/K 138/3.6.  H/H 11/34. MCV 85. Platelets 139  Results for JANN, RA (MRN [id number]) as of 06/20/2020 12:49  Ref. Range 06/07/2020 23:23 06/08/2020 04:27 06/09/2020 08:57  B Natriuretic Peptide Latest Ref Range: 0.0 - 100.0 pg/mL 831.9 (H) 996.6 (H) 690.3 (H)    08/2019-01/2020: BUN/Cr 14/1.1. EGFR 73 HbA1C 6.9% Chol 156, TG 215, HDL 52, LDL 88 TSH 1.7 normal (12/2018)   08/14/2018: Glucose 204, BUN/Cr 20/1.08. EGFR >60. Na/K 137/4.5.  H/H 10.6/32.7. MCV 83. Platelets 257 HbA1C 7.5% TSH 1.2 normal   Review of Systems  Cardiovascular:  Positive for dyspnea on exertion. Negative for chest pain, leg swelling, palpitations and syncope.  Respiratory:  Positive for shortness of breath.       Objective:    Vitals:   10/23/21 1024 10/23/21 1027  BP: (!) 157/85 (!) 142/75  Pulse: 76 66  Resp: 17   Temp: 98.1 F (36.7 C)   SpO2: 97% 97%       Physical Exam Vitals and nursing note reviewed.  Constitutional:      General: He is not in acute distress.    Appearance: He is obese.  Neck:     Vascular: No JVD.  Cardiovascular:     Rate and Rhythm: Normal rate and regular rhythm.     Heart sounds: Normal heart sounds. No murmur heard. Pulmonary:     Effort:  Pulmonary effort is normal.     Breath sounds: Normal breath sounds. No wheezing or rales.  Musculoskeletal:     Right lower leg: No edema.     Left lower leg: No edema.       Assessment & Recommendations:   55 year old African-American male with nonischemic cardiomyopathy, 07/2018, paroxysmal atrial fibrillation/flutter, hypertension, type II diabetes mellitus, morbid obesity, obstructive sleep apnea on CPAP.  Nonscehmic cardiomyopathy: EF 40-45%.(04/2020) Recent increase in dyspnea likely related to hypertension and stress. Currently on Entresto to 97-103 mg bid,  metoprolol succinate 100 mg daily. Did not tolerate spironolactone due to gynacomastia Continue lasix 40 mg 1-2 tab/day See hypertension controlled below. Given that he is hoping to undergo surgical treatment for weight loss, I will obtain echocardiogram to assess his LV function.  Paroxysmal Afib/flutter: Successful cardioversion 07/2018,  not  a candidate for ablation due severe LAA enlargement Maintianing sinus rhythm. Continue metoprolol succiante 100 mg daily, amiodarone 200 mg bid.  CHA2DS2VASc score 3, annual stroke risk 3.6% Continue Xarelto 20 mg daily. Continue management of OSA with CPAP, weight loss.  Hypertension: Uncontrolled.  Discrepancy regarding patient's medications. Have not made any change today.  I will recheck his blood pressure at next visit after echocardiogram.  OSA: Severe, on CPAP.   Type II DM: Managed by PCP.  Mixed hyperlipidemia: Currently not on statin.  He wants to wait and repeat his lipid panel.  Arguably, [facility] lipid numbers are favorable, but does still need statin given that he is diabetic.  We will discuss again at next visit.  F/u in 4 to 6 weeks   [place], MD [facility] Pager: [phone] Office: [phone] If no answer Cell [phone]

## 2021-10-30 ENCOUNTER — Other Ambulatory Visit: Payer: Self-pay

## 2021-10-30 ENCOUNTER — Ambulatory Visit: Payer: Medicare HMO | Admitting: Podiatry

## 2021-10-30 ENCOUNTER — Encounter: Payer: Self-pay | Admitting: Podiatry

## 2021-10-30 DIAGNOSIS — E119 Type 2 diabetes mellitus without complications: Secondary | ICD-10-CM | POA: Diagnosis not present

## 2021-10-30 NOTE — Progress Notes (Signed)
  Subjective:  Patient ID: Juan Snyder, male    DOB: 21-Sep-1966,   MRN: [id number]  No chief complaint on file.   55 y.o. male presents for diabetic foot exam. Denies any pain in his feet. Denies burning or tingling. Is in pain management for his hip and his back but no issues with feet.  Last A1c was 6.7 on 10/17/21. Denies any other pedal complaints. Denies n/v/f/c.   PCP: Jackie Plum MD   Past Medical History:  Diagnosis Date   Back pain, chronic    CHF (congestive heart failure) (HCC)    Diabetes mellitus    Dyspnea    Hypertension    Nonischemic cardiomyopathy (HCC)    Obesity    PAF (paroxysmal atrial fibrillation) (HCC)    Sleep apnea    uses CPAP    Objective:  Physical Exam: Vascular: DP/PT pulses 2/4 bilateral. CFT <3 seconds. Normal hair growth on digits. No edema.  Skin. No lacerations or abrasions bilateral feet. Nails 1-5 bilateral normal in appearance.  Musculoskeletal: MMT 5/5 bilateral lower extremities in DF, PF, Inversion and Eversion. Deceased ROM in DF of ankle joint.  Neurological: Sensation intact to light touch. Protective sensation intact.   Assessment:   1. Type 2 diabetes mellitus without complication, unspecified whether long term insulin use (HCC)      Plan:  Patient was evaluated and treated and all questions answered. -Discussed and educated patient on diabetic foot care, especially with  regards to the vascular, neurological and musculoskeletal systems.  -Stressed the importance of good glycemic control and the detriment of not  controlling glucose levels in relation to the foot. -Discussed supportive shoes at all times and checking feet regularly.  -Answered all patient questions -Patient to return in 1 year for diabetic foot exam.  -Patient advised to call the office if any problems or questions arise in the meantime.   Louann Sjogren, DPM

## 2021-11-01 ENCOUNTER — Ambulatory Visit: Payer: Medicare HMO

## 2021-11-01 ENCOUNTER — Other Ambulatory Visit: Payer: Self-pay

## 2021-11-01 DIAGNOSIS — I10 Essential (primary) hypertension: Secondary | ICD-10-CM | POA: Diagnosis not present

## 2021-11-01 DIAGNOSIS — I5022 Chronic systolic (congestive) heart failure: Secondary | ICD-10-CM

## 2021-11-06 ENCOUNTER — Other Ambulatory Visit: Payer: Medicare HMO

## 2021-11-23 ENCOUNTER — Other Ambulatory Visit: Payer: Self-pay | Admitting: Cardiology

## 2021-11-29 ENCOUNTER — Other Ambulatory Visit: Payer: Self-pay

## 2021-11-29 ENCOUNTER — Encounter: Payer: Self-pay | Admitting: Cardiology

## 2021-11-29 ENCOUNTER — Ambulatory Visit: Payer: Medicare HMO | Admitting: Cardiology

## 2021-11-29 VITALS — BP 151/88 | HR 56 | Temp 97.6°F | Resp 16 | Ht 68.0 in | Wt 304.0 lb

## 2021-11-29 DIAGNOSIS — I480 Paroxysmal atrial fibrillation: Secondary | ICD-10-CM

## 2021-11-29 DIAGNOSIS — I10 Essential (primary) hypertension: Secondary | ICD-10-CM | POA: Diagnosis not present

## 2021-11-29 DIAGNOSIS — I5032 Chronic diastolic (congestive) heart failure: Secondary | ICD-10-CM

## 2021-11-29 MED ORDER — AMLODIPINE BESYLATE 10 MG PO TABS: 5.0000 mg | ORAL_TABLET | Freq: Every day | ORAL | 1 refills | Status: DC

## 2021-11-29 NOTE — Progress Notes (Signed)
Patient is here for follow up visit.  Subjective:   Juan Snyder, male    DOB: Jun 27, 1966, 56 y.o.   MRN: [id number]   Chief Complaint  Patient presents with   Hypertension   Follow-up    4-6 week     HPI  56 year old African-American male with nonischemic cardiomyopathy, 07/2018, paroxysmal atrial fibrillation/flutter status post successful cardioversion 08/13/2018,  not a candidate for ablation due severe LAA enlargement, hypertension, type II diabetes mellitus, morbid obesity, obstructive sleep apnea on CPAP.  Dyspnea has improved after increasing Lasix dose.  Reviewed recent echocardiogram results, details below.  Blood pressure remains elevated.   ] Current Outpatient Medications on File Prior to Visit  Medication Sig Dispense Refill   albuterol (PROVENTIL HFA;VENTOLIN HFA) 108 (90 Base) MCG/ACT inhaler Inhale 1 puff into the lungs every 6 (six) hours as needed for wheezing or shortness of breath.     amiodarone (PACERONE) 200 MG tablet TAKE 1 TABLET BY MOUTH EVERY DAY 90 tablet 1   amLODipine (NORVASC) 5 MG tablet TAKE 1 TABLET (5 MG TOTAL) BY MOUTH DAILY. 90 tablet 1   clindamycin (CLEOCIN) 300 MG capsule Take 300 mg by mouth every 6 (six) hours.     ENTRESTO 97-103 MG TAKE 1 TABLET BY MOUTH TWICE A DAY (Patient taking differently: Take 1 tablet by mouth 2 (two) times daily.) 60 tablet 3   fluticasone (FLONASE) 50 MCG/ACT nasal spray Place 1 spray into both nostrils daily as needed for allergies.      furosemide (LASIX) 40 MG tablet Take 1 tablet (40 mg total) by mouth 2 (two) times daily. One tablet in morning, Additional one tablet in the afternoon for leg swelling, weight gain,as needed (Patient taking differently: Take 40 mg by mouth 2 (two) times daily. Additional 1 tablet in the morning if needed.) 120 tablet 1   hydrOXYzine (VISTARIL) 25 MG capsule Take 25 mg by mouth at bedtime.     magnesium oxide (MAG-OX) 400 MG tablet Take 200 mg by mouth 2 (two) times daily.      metFORMIN (GLUCOPHAGE) 1000 MG tablet Take 1,000 mg by mouth 2 (two) times daily.     metFORMIN (GLUCOPHAGE) 500 MG tablet TAKE 1 TABLET BY MOUTH 2 TIMES DAILY WITH A MEAL. 180 tablet 1   methocarbamol (ROBAXIN) 750 MG tablet Take 750 mg by mouth every 8 (eight) hours as needed for muscle spasms.     metoprolol succinate (TOPROL-XL) 100 MG 24 hr tablet Take 1 tablet (100 mg total) by mouth daily. Take with or immediately following a meal. (Patient taking differently: Take 100 mg by mouth daily.) 30 tablet 0   Multiple Vitamins-Minerals (MULTIVITAMIN PO) Take 1 tablet by mouth daily.     oxyCODONE-acetaminophen (PERCOCET) 10-325 MG tablet Take 1 tablet by mouth every 4 (four) hours as needed for pain.     Potassium Chloride ER 20 MEQ TBCR TAKE 10 MEQ (1/2 TAB) BY MOUTH DAILY. 45 tablet 3   sildenafil (REVATIO) 20 MG tablet Take 1 tablet by mouth as needed.     SYMBICORT 160-4.5 MCG/ACT inhaler Inhale 1 puff into the lungs daily.     tamsulosin (FLOMAX) 0.4 MG CAPS capsule Take 0.4 mg by mouth daily.      traMADol (ULTRAM) 50 MG tablet Take 1 tablet (50 mg total) by mouth every 6 (six) hours as needed. 20 tablet 0   XARELTO 20 MG TABS tablet TAKE 1 TABLET BY MOUTH EVERY DAY 90 tablet 3  ZYLET 0.5-0.3 % SUSP Apply 1 drop to eye.     No current facility-administered medications on file prior to visit.    Cardiovascular studies:  Echocardiogram 11/01/2021:  Normal LV systolic function with visual EF 55-60%. Left ventricle cavity  is normal in size. Mild left ventricular hypertrophy. Normal global wall  motion. Doppler evidence of grade III (restrictive) diastolic dysfunction,  elevated LAP.  Left atrial cavity is moderately dilated.  Mild (Grade I) mitral regurgitation.  Mild tricuspid regurgitation.  Mild pulmonic regurgitation.  Compared to study 05/05/2021 LVEF improved from 40-45% to 55-60%, G3DD is  new, severe LAE is now moderate, mild TR/PR are new.   EKG 10/23/2021: Sinus rhythm  70 bpm  Nonspecific T-abnormality  Echocardiogram 05/05/2020:  Left ventricle cavity is normal in size. Moderate concentric hypertrophy  of the left ventricle. Mild global hypokinesis. LVEF 40-45%. Diastolic  function indeterminate. Elevated LAP.  Left atrial cavity is severely dilated.  Moderate (Grade II) mitral regurgitation.  Normal right atrial pressure.  Compared to previous study on 04/21/2019, pulmonary hypertension now absent   R&LHC 08/12/2018: Left dominant circulation with no significant CAD Mildly elevated LVEDP (17 mmHg) Moderate WHO Grp II PH (Mean PA 33 mmHg) Mildly decompensated arrhythmia induced nonischemic cardiomyopathy. Continue guideline directed heart failure therapy, and management of Afib.   Recent labs: 03/23/2021: Glucose 142, BUN/Cr 17/1.2. EGFR ?. Na/K 138/5.1.  H/H 13/39.  12/28/2020: Glucose 146, BUN/Cr 16/1.17. EGFR >60. Na/K 137/4.4. T. Bili 1.3. Rest of the CMP normal H/H 13/40. MCV 83. Platelets 249   06/10/2020: Glucose 99, BUN/Cr 16/1.29. EGFR >60. Na/K 138/3.6.  H/H 11/34. MCV 85. Platelets 139  Results for Juan Snyder (MRN [id number]) as of 06/20/2020 12:49  Ref. Range 06/07/2020 23:23 06/08/2020 04:27 06/09/2020 08:57  B Natriuretic Peptide Latest Ref Range: 0.0 - 100.0 pg/mL 831.9 (H) 996.6 (H) 690.3 (H)    08/2019-01/2020: BUN/Cr 14/1.1. EGFR 73 HbA1C 6.9% Chol 156, TG 215, HDL 52, LDL 88 TSH 1.7 normal (12/2018)   08/14/2018: Glucose 204, BUN/Cr 20/1.08. EGFR >60. Na/K 137/4.5.  H/H 10.6/32.7. MCV 83. Platelets 257 HbA1C 7.5% TSH 1.2 normal   Review of Systems  Cardiovascular:  Positive for dyspnea on exertion. Negative for chest pain, leg swelling, palpitations and syncope.  Respiratory:  Positive for shortness of breath.       Objective:    Vitals:   11/29/21 1101  BP: (!) 151/88  Pulse: (!) 56  Resp: 16  Temp: 97.6 F (36.4 C)  SpO2: 95%       Physical Exam Vitals and nursing note reviewed.  Constitutional:       General: He is not in acute distress.    Appearance: He is obese.  Neck:     Vascular: No JVD.  Cardiovascular:     Rate and Rhythm: Normal rate and regular rhythm.     Heart sounds: Normal heart sounds. No murmur heard. Pulmonary:     Effort: Pulmonary effort is normal.     Breath sounds: Normal breath sounds. No wheezing or rales.  Musculoskeletal:     Right lower leg: No edema.     Left lower leg: No edema.       Assessment & Recommendations:   56 year old African-American male with nonischemic cardiomyopathy, 07/2018, paroxysmal atrial fibrillation/flutter, hypertension, type II diabetes mellitus, morbid obesity, obstructive sleep apnea on CPAP.  Nonscehmic cardiomyopathy: EF 40-45%.(04/2020), now recovered to 55-60% (10/2021) with grade III DD Recent dyspnea likely more due to HFpEF with diastolic dysfunction  Currently on Entresto to 97-103 mg bid,  metoprolol succinate 100 mg daily. Did not tolerate spironolactone due to gynacomastia Continue lasix 40 mg 1-2 tab/day See below regarding hypertension management  Paroxysmal Afib/flutter: Successful cardioversion 07/2018,  not a candidate for ablation due severe LAA enlargement Maintianing sinus rhythm. Continue metoprolol succiante 100 mg daily, amiodarone 200 mg bid.  CHA2DS2VASc score 3, annual stroke risk 3.6% Continue Xarelto 20 mg daily. Continue management of OSA with CPAP, weight loss.  Hypertension: Uncontrolled.  Increase amlodipine to 10 mg daily.  Continue rest of the antihypertensive therapy.  OSA: Severe, on CPAP.   Type II DM: Managed by PCP.  Mixed hyperlipidemia: Currently not on statin.  He wants to wait and repeat his lipid panel.  Arguably, [facility] lipid numbers are favorable, but does still need statin given that he is diabetic.  We will discuss again at next visit.  F/u in 3 months    [place], MD [facility] Pager: [phone] Office: [phone] If no  answer Cell [phone]

## 2022-02-06 DIAGNOSIS — E119 Type 2 diabetes mellitus without complications: Secondary | ICD-10-CM | POA: Diagnosis not present

## 2022-02-27 NOTE — Progress Notes (Signed)
? ? ?Patient is here for follow up visit. ? ?Subjective:  ? ?Juan Snyder, male    DOB: December 13, 1965, 56 y.o.   MRN: [id number] ? ? ?Chief Complaint  ?Patient presents with  ? Hypertension  ? Congestive Heart Failure  ? Follow-up  ?  3 month  ? ?  ?HPI ? ?56 year old African-American male with nonischemic cardiomyopathy, 07/2018, paroxysmal atrial fibrillation/flutter status post successful cardioversion 08/13/2018,  not a candidate for ablation due severe LAA enlargement, hypertension, type II diabetes mellitus, morbid obesity, obstructive sleep apnea on CPAP. ? ?Patient is doing well. He denies overt dyspnea or leg edema. Blood pressure runs 140s-150s. He does not want to add any other medications. In fact, he wants to come off some medications, if possible. He wants to lose weight.  ? ? ?Current Outpatient Medications:  ?  amiodarone (PACERONE) 200 MG tablet, TAKE 1 TABLET BY MOUTH EVERY DAY, Disp: 90 tablet, Rfl: 1 ?  amLODipine (NORVASC) 10 MG tablet, Take 0.5 tablets (5 mg total) by mouth daily., Disp: 90 tablet, Rfl: 1 ?  fluticasone (FLONASE) 50 MCG/ACT nasal spray, Place 1 spray into both nostrils daily as needed for allergies. , Disp: , Rfl:  ?  furosemide (LASIX) 40 MG tablet, Take 1 tablet (40 mg total) by mouth 2 (two) times daily. One tablet in morning, Additional one tablet in the afternoon for leg swelling, weight gain,as needed (Patient taking differently: Take 40 mg by mouth 2 (two) times daily. Additional 1 tablet in the morning if needed.), Disp: 120 tablet, Rfl: 1 ?  hydrOXYzine (VISTARIL) 25 MG capsule, Take 25 mg by mouth at bedtime., Disp: , Rfl:  ?  magnesium oxide (MAG-OX) 400 MG tablet, Take 200 mg by mouth 2 (two) times daily., Disp: , Rfl:  ?  metFORMIN (GLUCOPHAGE) 500 MG tablet, TAKE 1 TABLET BY MOUTH 2 TIMES DAILY WITH A MEAL., Disp: 180 tablet, Rfl: 1 ?  methocarbamol (ROBAXIN) 750 MG tablet, Take 750 mg by mouth every 8 (eight) hours as needed for muscle spasms., Disp: , Rfl:  ?   metoprolol succinate (TOPROL-XL) 100 MG 24 hr tablet, Take 1 tablet (100 mg total) by mouth daily. Take with or immediately following a meal. (Patient taking differently: Take 100 mg by mouth daily.), Disp: 30 tablet, Rfl: 0 ?  Multiple Vitamins-Minerals (MULTIVITAMIN PO), Take 1 tablet by mouth daily., Disp: , Rfl:  ?  oxyCODONE-acetaminophen (PERCOCET) 10-325 MG tablet, Take 1 tablet by mouth every 4 (four) hours as needed for pain., Disp: , Rfl:  ?  Potassium Chloride ER 20 MEQ TBCR, TAKE 10 MEQ (1/2 TAB) BY MOUTH DAILY., Disp: 45 tablet, Rfl: 3 ?  sildenafil (REVATIO) 20 MG tablet, Take 1 tablet by mouth as needed., Disp: , Rfl:  ?  SYMBICORT 160-4.5 MCG/ACT inhaler, Inhale 1 puff into the lungs daily., Disp: , Rfl:  ?  tamsulosin (FLOMAX) 0.4 MG CAPS capsule, Take 0.4 mg by mouth daily. , Disp: , Rfl:  ?  traMADol (ULTRAM) 50 MG tablet, Take 1 tablet (50 mg total) by mouth every 6 (six) hours as needed., Disp: 20 tablet, Rfl: 0 ?  XARELTO 20 MG TABS tablet, TAKE 1 TABLET BY MOUTH EVERY DAY, Disp: 90 tablet, Rfl: 3 ?  ZYLET 0.5-0.3 % SUSP, Apply 1 drop to eye., Disp: , Rfl:  ?  albuterol (PROVENTIL HFA;VENTOLIN HFA) 108 (90 Base) MCG/ACT inhaler, Inhale 1 puff into the lungs every 6 (six) hours as needed for wheezing or shortness of breath., Disp: ,  Rfl:  ?  ENTRESTO 97-103 MG, TAKE 1 TABLET BY MOUTH TWICE A DAY (Patient taking differently: Take 1 tablet by mouth 2 (two) times daily.), Disp: 60 tablet, Rfl: 3 ? ?Cardiovascular studies: ? ?Echocardiogram 11/01/2021:  ?Normal LV systolic function with visual EF 55-60%. Left ventricle cavity  ?is normal in size. Mild left ventricular hypertrophy. Normal global wall  ?motion. Doppler evidence of grade III (restrictive) diastolic dysfunction,  ?elevated LAP.  ?Left atrial cavity is moderately dilated.  ?Mild (Grade I) mitral regurgitation.  ?Mild tricuspid regurgitation.  ?Mild pulmonic regurgitation.  ?Compared to study 05/05/2021 LVEF improved from 40-45% to 55-60%,  G3DD is  ?new, severe LAE is now moderate, mild TR/PR are new.  ? ?EKG 10/23/2021: ?Sinus rhythm 70 bpm  ?Nonspecific T-abnormality ? ?Echocardiogram 05/05/2020:  ?Left ventricle cavity is normal in size. Moderate concentric hypertrophy  ?of the left ventricle. Mild global hypokinesis. LVEF 40-45%. Diastolic  ?function indeterminate. Elevated LAP.  ?Left atrial cavity is severely dilated.  ?Moderate (Grade II) mitral regurgitation.  ?Normal right atrial pressure.  ?Compared to previous study on 04/21/2019, pulmonary hypertension now absent  ? ?R&LHC 08/12/2018: ?Left dominant circulation with no significant CAD ?Mildly elevated LVEDP (17 mmHg) ?Moderate WHO Grp II PH (Mean PA 33 mmHg) ?Mildly decompensated arrhythmia induced nonischemic cardiomyopathy. ?Continue guideline directed heart failure therapy, and management of Afib. ? ? ?Recent labs: ?03/23/2021: ?Glucose 142, BUN/Cr 17/1.2. EGFR ?. Na/K 138/5.1.  ?H/H 13/39. ? ?12/28/2020: ?Glucose 146, BUN/Cr 16/1.17. EGFR >60. Na/K 137/4.4. T. Bili 1.3. Rest of the CMP normal ?H/H 13/40. MCV 83. Platelets 249 ? ? ?06/10/2020: ?Glucose 99, BUN/Cr 16/1.29. EGFR >60. Na/K 138/3.6.  ?H/H 11/34. MCV 85. Platelets 139 ? ?Results for Juan Snyder, Juan Snyder (MRN [id number]) as of 06/20/2020 12:49 ? Ref. Range 06/07/2020 23:23 06/08/2020 04:27 06/09/2020 08:57  ?B Natriuretic Peptide Latest Ref Range: 0.0 - 100.0 pg/mL 831.9 (H) 996.6 (H) 690.3 (H)  ? ? ?08/2019-01/2020: ?BUN/Cr 14/1.1. EGFR 73 ?HbA1C 6.9% ?Chol 156, TG 215, HDL 52, LDL 88 ?TSH 1.7 normal (12/2018) ? ? ?08/14/2018: ?Glucose 204, BUN/Cr 20/1.08. EGFR >60. Na/K 137/4.5.  ?H/H 10.6/32.7. MCV 83. Platelets 257 ?HbA1C 7.5% ?TSH 1.2 normal ? ? ?Review of Systems  ?Cardiovascular:  Positive for dyspnea on exertion. Negative for chest pain, leg swelling, palpitations and syncope.  ?Respiratory:  Positive for shortness of breath.   ? ?   ?Objective:  ? ? ?Vitals:  ? 02/28/22 1124  ?BP: (!) 152/81  ?Pulse: (!) 58  ?Resp: 16  ?Temp: 98 ?F (36.7  ?C)  ?SpO2: 98%  ?  ? ? ? Physical Exam ?Vitals and nursing note reviewed.  ?Constitutional:   ?   General: He is not in acute distress. ?   Appearance: He is obese.  ?Neck:  ?   Vascular: No JVD.  ?Cardiovascular:  ?   Rate and Rhythm: Normal rate and regular rhythm.  ?   Heart sounds: Normal heart sounds. No murmur heard. ?Pulmonary:  ?   Effort: Pulmonary effort is normal.  ?   Breath sounds: Normal breath sounds. No wheezing or rales.  ?Musculoskeletal:  ?   Right lower leg: No edema.  ?   Left lower leg: No edema.  ? ?  ICD-10-CM   ?1. Chronic heart failure with preserved ejection fraction (HCC)  I50.32   ?  ?2. Paroxysmal A-fib (HCC)  I48.0   ?  ?3. Essential hypertension, benign  I10   ?  ?4. Morbid obesity ([place])  E66.01   ?  ? ?  Medications Discontinued During This Encounter  ?Medication Reason  ? metFORMIN (GLUCOPHAGE) 1000 MG tablet   ? clindamycin (CLEOCIN) 300 MG capsule   ? amiodarone (PACERONE) 200 MG tablet Discontinued by provider  ? ? ?   ?Assessment & Recommendations:  ? ?56 year old African-American male with nonischemic cardiomyopathy, 07/2018, paroxysmal atrial fibrillation/flutter, hypertension, type II diabetes mellitus, morbid obesity, obstructive sleep apnea on CPAP. ? ?Nonscehmic cardiomyopathy: ?EF 40-45%.(04/2020), now recovered to 55-60% (10/2021) with grade III DD ?Recent dyspnea likely more due to HFpEF with diastolic dysfunction ?Currently on Entresto to 97-103 mg bid,  metoprolol succinate 100 mg daily. ?Did not tolerate spironolactone due to gynacomastia ?Continue lasix 40 mg 1-2 tab/day ? ?Paroxysmal Afib/flutter: ?Successful cardioversion 07/2018,  not a candidate for ablation due severe LAA enlargement ?Maintianing sinus rhythm.  ?Continue metoprolol succiante 100 mg daily. ?I am okay with his request of coming off amiodarone.  ?CHA2DS2VASc score 3, annual stroke risk 3.6% ?Continue Xarelto 20 mg daily. ?Continue management of OSA with CPAP, weight loss. ?I have encouraged him to  discuss with PCP regarding Ozempic/Wegovy. ? ?Hypertension: ?Stage 1 hypertension. He is reluctant to add any more medications at this time. I am optimistic that weight loss may improve his blood press

## 2022-02-28 ENCOUNTER — Ambulatory Visit: Payer: Medicare HMO | Admitting: Cardiology

## 2022-02-28 ENCOUNTER — Encounter: Payer: Self-pay | Admitting: Cardiology

## 2022-02-28 VITALS — BP 152/81 | HR 58 | Temp 98.0°F | Resp 16 | Ht 68.0 in | Wt 307.0 lb

## 2022-02-28 DIAGNOSIS — I5032 Chronic diastolic (congestive) heart failure: Secondary | ICD-10-CM | POA: Insufficient documentation

## 2022-02-28 DIAGNOSIS — G4733 Obstructive sleep apnea (adult) (pediatric): Secondary | ICD-10-CM | POA: Diagnosis not present

## 2022-02-28 DIAGNOSIS — I480 Paroxysmal atrial fibrillation: Secondary | ICD-10-CM

## 2022-02-28 DIAGNOSIS — I10 Essential (primary) hypertension: Secondary | ICD-10-CM | POA: Diagnosis not present

## 2022-03-07 DIAGNOSIS — J1282 Pneumonia due to coronavirus disease 2019: Secondary | ICD-10-CM | POA: Diagnosis not present

## 2022-03-07 DIAGNOSIS — G4489 Other headache syndrome: Secondary | ICD-10-CM | POA: Diagnosis not present

## 2022-03-07 DIAGNOSIS — Z125 Encounter for screening for malignant neoplasm of prostate: Secondary | ICD-10-CM | POA: Diagnosis not present

## 2022-03-07 DIAGNOSIS — Z1329 Encounter for screening for other suspected endocrine disorder: Secondary | ICD-10-CM | POA: Diagnosis not present

## 2022-03-07 DIAGNOSIS — Z136 Encounter for screening for cardiovascular disorders: Secondary | ICD-10-CM | POA: Diagnosis not present

## 2022-03-07 DIAGNOSIS — Z2821 Immunization not carried out because of patient refusal: Secondary | ICD-10-CM | POA: Diagnosis not present

## 2022-03-07 DIAGNOSIS — N400 Enlarged prostate without lower urinary tract symptoms: Secondary | ICD-10-CM | POA: Diagnosis not present

## 2022-03-07 DIAGNOSIS — Z0001 Encounter for general adult medical examination with abnormal findings: Secondary | ICD-10-CM | POA: Diagnosis not present

## 2022-03-07 DIAGNOSIS — R739 Hyperglycemia, unspecified: Secondary | ICD-10-CM | POA: Diagnosis not present

## 2022-03-07 DIAGNOSIS — E559 Vitamin D deficiency, unspecified: Secondary | ICD-10-CM | POA: Diagnosis not present

## 2022-03-07 DIAGNOSIS — N528 Other male erectile dysfunction: Secondary | ICD-10-CM | POA: Diagnosis not present

## 2022-03-07 DIAGNOSIS — E782 Mixed hyperlipidemia: Secondary | ICD-10-CM | POA: Diagnosis not present

## 2022-03-07 DIAGNOSIS — E1165 Type 2 diabetes mellitus with hyperglycemia: Secondary | ICD-10-CM | POA: Diagnosis not present

## 2022-03-07 DIAGNOSIS — I480 Paroxysmal atrial fibrillation: Secondary | ICD-10-CM | POA: Diagnosis not present

## 2022-03-07 DIAGNOSIS — J302 Other seasonal allergic rhinitis: Secondary | ICD-10-CM | POA: Diagnosis not present

## 2022-03-07 DIAGNOSIS — J449 Chronic obstructive pulmonary disease, unspecified: Secondary | ICD-10-CM | POA: Diagnosis not present

## 2022-03-07 DIAGNOSIS — I10 Essential (primary) hypertension: Secondary | ICD-10-CM | POA: Diagnosis not present

## 2022-03-07 DIAGNOSIS — I119 Hypertensive heart disease without heart failure: Secondary | ICD-10-CM | POA: Diagnosis not present

## 2022-03-29 DIAGNOSIS — E782 Mixed hyperlipidemia: Secondary | ICD-10-CM | POA: Diagnosis not present

## 2022-03-29 DIAGNOSIS — I10 Essential (primary) hypertension: Secondary | ICD-10-CM | POA: Diagnosis not present

## 2022-03-29 DIAGNOSIS — R7989 Other specified abnormal findings of blood chemistry: Secondary | ICD-10-CM | POA: Diagnosis not present

## 2022-03-29 DIAGNOSIS — J302 Other seasonal allergic rhinitis: Secondary | ICD-10-CM | POA: Diagnosis not present

## 2022-03-29 DIAGNOSIS — E559 Vitamin D deficiency, unspecified: Secondary | ICD-10-CM | POA: Diagnosis not present

## 2022-03-29 DIAGNOSIS — E1165 Type 2 diabetes mellitus with hyperglycemia: Secondary | ICD-10-CM | POA: Diagnosis not present

## 2022-03-29 DIAGNOSIS — R69 Illness, unspecified: Secondary | ICD-10-CM | POA: Diagnosis not present

## 2022-03-29 DIAGNOSIS — N401 Enlarged prostate with lower urinary tract symptoms: Secondary | ICD-10-CM | POA: Diagnosis not present

## 2022-03-29 DIAGNOSIS — I119 Hypertensive heart disease without heart failure: Secondary | ICD-10-CM | POA: Diagnosis not present

## 2022-03-29 DIAGNOSIS — G4489 Other headache syndrome: Secondary | ICD-10-CM | POA: Diagnosis not present

## 2022-03-29 DIAGNOSIS — J449 Chronic obstructive pulmonary disease, unspecified: Secondary | ICD-10-CM | POA: Diagnosis not present

## 2022-03-30 DIAGNOSIS — G4733 Obstructive sleep apnea (adult) (pediatric): Secondary | ICD-10-CM | POA: Diagnosis not present

## 2022-04-05 DIAGNOSIS — N5201 Erectile dysfunction due to arterial insufficiency: Secondary | ICD-10-CM | POA: Diagnosis not present

## 2022-04-05 DIAGNOSIS — N401 Enlarged prostate with lower urinary tract symptoms: Secondary | ICD-10-CM | POA: Diagnosis not present

## 2022-04-05 DIAGNOSIS — R3912 Poor urinary stream: Secondary | ICD-10-CM | POA: Diagnosis not present

## 2022-04-14 ENCOUNTER — Other Ambulatory Visit: Payer: Self-pay | Admitting: Cardiology

## 2022-04-30 DIAGNOSIS — G4733 Obstructive sleep apnea (adult) (pediatric): Secondary | ICD-10-CM | POA: Diagnosis not present

## 2022-06-15 DIAGNOSIS — G4733 Obstructive sleep apnea (adult) (pediatric): Secondary | ICD-10-CM | POA: Diagnosis not present

## 2022-06-22 DIAGNOSIS — R5383 Other fatigue: Secondary | ICD-10-CM | POA: Diagnosis not present

## 2022-06-22 DIAGNOSIS — E611 Iron deficiency: Secondary | ICD-10-CM | POA: Diagnosis not present

## 2022-06-22 DIAGNOSIS — E119 Type 2 diabetes mellitus without complications: Secondary | ICD-10-CM | POA: Diagnosis not present

## 2022-06-27 ENCOUNTER — Ambulatory Visit: Payer: Medicare HMO | Admitting: Podiatry

## 2022-06-29 DIAGNOSIS — F419 Anxiety disorder, unspecified: Secondary | ICD-10-CM | POA: Diagnosis not present

## 2022-06-29 DIAGNOSIS — G4489 Other headache syndrome: Secondary | ICD-10-CM | POA: Diagnosis not present

## 2022-06-29 DIAGNOSIS — I119 Hypertensive heart disease without heart failure: Secondary | ICD-10-CM | POA: Diagnosis not present

## 2022-06-29 DIAGNOSIS — I10 Essential (primary) hypertension: Secondary | ICD-10-CM | POA: Diagnosis not present

## 2022-06-29 DIAGNOSIS — J302 Other seasonal allergic rhinitis: Secondary | ICD-10-CM | POA: Diagnosis not present

## 2022-06-29 DIAGNOSIS — R69 Illness, unspecified: Secondary | ICD-10-CM | POA: Diagnosis not present

## 2022-06-29 DIAGNOSIS — E559 Vitamin D deficiency, unspecified: Secondary | ICD-10-CM | POA: Diagnosis not present

## 2022-06-29 DIAGNOSIS — I5032 Chronic diastolic (congestive) heart failure: Secondary | ICD-10-CM | POA: Diagnosis not present

## 2022-06-29 DIAGNOSIS — J449 Chronic obstructive pulmonary disease, unspecified: Secondary | ICD-10-CM | POA: Diagnosis not present

## 2022-06-29 DIAGNOSIS — Z0001 Encounter for general adult medical examination with abnormal findings: Secondary | ICD-10-CM | POA: Diagnosis not present

## 2022-06-29 DIAGNOSIS — R7989 Other specified abnormal findings of blood chemistry: Secondary | ICD-10-CM | POA: Diagnosis not present

## 2022-06-29 DIAGNOSIS — E1165 Type 2 diabetes mellitus with hyperglycemia: Secondary | ICD-10-CM | POA: Diagnosis not present

## 2022-06-29 DIAGNOSIS — E782 Mixed hyperlipidemia: Secondary | ICD-10-CM | POA: Diagnosis not present

## 2022-07-13 DIAGNOSIS — J449 Chronic obstructive pulmonary disease, unspecified: Secondary | ICD-10-CM | POA: Diagnosis not present

## 2022-07-13 DIAGNOSIS — E559 Vitamin D deficiency, unspecified: Secondary | ICD-10-CM | POA: Diagnosis not present

## 2022-07-13 DIAGNOSIS — E1165 Type 2 diabetes mellitus with hyperglycemia: Secondary | ICD-10-CM | POA: Diagnosis not present

## 2022-07-13 DIAGNOSIS — I10 Essential (primary) hypertension: Secondary | ICD-10-CM | POA: Diagnosis not present

## 2022-07-13 DIAGNOSIS — G4489 Other headache syndrome: Secondary | ICD-10-CM | POA: Diagnosis not present

## 2022-07-13 DIAGNOSIS — N289 Disorder of kidney and ureter, unspecified: Secondary | ICD-10-CM | POA: Diagnosis not present

## 2022-07-13 DIAGNOSIS — I5032 Chronic diastolic (congestive) heart failure: Secondary | ICD-10-CM | POA: Diagnosis not present

## 2022-07-13 DIAGNOSIS — M25511 Pain in right shoulder: Secondary | ICD-10-CM | POA: Diagnosis not present

## 2022-07-13 DIAGNOSIS — R7989 Other specified abnormal findings of blood chemistry: Secondary | ICD-10-CM | POA: Diagnosis not present

## 2022-07-13 DIAGNOSIS — F419 Anxiety disorder, unspecified: Secondary | ICD-10-CM | POA: Diagnosis not present

## 2022-07-13 DIAGNOSIS — E782 Mixed hyperlipidemia: Secondary | ICD-10-CM | POA: Diagnosis not present

## 2022-07-16 DIAGNOSIS — G4733 Obstructive sleep apnea (adult) (pediatric): Secondary | ICD-10-CM | POA: Diagnosis not present

## 2022-08-10 DIAGNOSIS — I5032 Chronic diastolic (congestive) heart failure: Secondary | ICD-10-CM | POA: Diagnosis not present

## 2022-08-10 DIAGNOSIS — R69 Illness, unspecified: Secondary | ICD-10-CM | POA: Diagnosis not present

## 2022-08-10 DIAGNOSIS — R7989 Other specified abnormal findings of blood chemistry: Secondary | ICD-10-CM | POA: Diagnosis not present

## 2022-08-10 DIAGNOSIS — E782 Mixed hyperlipidemia: Secondary | ICD-10-CM | POA: Diagnosis not present

## 2022-08-10 DIAGNOSIS — E559 Vitamin D deficiency, unspecified: Secondary | ICD-10-CM | POA: Diagnosis not present

## 2022-08-10 DIAGNOSIS — N289 Disorder of kidney and ureter, unspecified: Secondary | ICD-10-CM | POA: Diagnosis not present

## 2022-08-10 DIAGNOSIS — I10 Essential (primary) hypertension: Secondary | ICD-10-CM | POA: Diagnosis not present

## 2022-08-10 DIAGNOSIS — E1165 Type 2 diabetes mellitus with hyperglycemia: Secondary | ICD-10-CM | POA: Diagnosis not present

## 2022-08-10 DIAGNOSIS — J449 Chronic obstructive pulmonary disease, unspecified: Secondary | ICD-10-CM | POA: Diagnosis not present

## 2022-08-10 DIAGNOSIS — G4489 Other headache syndrome: Secondary | ICD-10-CM | POA: Diagnosis not present

## 2022-08-14 ENCOUNTER — Ambulatory Visit: Payer: Medicare HMO | Admitting: Podiatry

## 2022-08-14 ENCOUNTER — Encounter: Payer: Self-pay | Admitting: Podiatry

## 2022-08-14 DIAGNOSIS — E119 Type 2 diabetes mellitus without complications: Secondary | ICD-10-CM | POA: Diagnosis not present

## 2022-08-14 NOTE — Progress Notes (Signed)
  Subjective:  Patient ID: Juan Snyder, male    DOB: [birth year],   MRN: [id number]  Chief Complaint  Patient presents with   diabetic foot exam      56 y.o. male presents for diabetic foot exam. Denies any pain in his feet. Denies burning or tingling. Is in pain management for his hip and his back but no issues with feet.  Last A1c was 6.7 Denies any other pedal complaints. Denies n/v/f/c.   PCP: Benito Mccreedy MD   Past Medical History:  Diagnosis Date   Back pain, chronic    CHF (congestive heart failure) (HCC)    Diabetes mellitus    Dyspnea    Hypertension    Nonischemic cardiomyopathy (HCC)    Obesity    PAF (paroxysmal atrial fibrillation) ([place])    Sleep apnea    uses CPAP    Objective:  Physical Exam: Vascular: DP/PT pulses 2/4 bilateral. CFT <3 seconds. Normal hair growth on digits. No edema.  Skin. No lacerations or abrasions bilateral feet. Nails 1-5 bilateral normal in appearance.  Musculoskeletal: MMT 5/5 bilateral lower extremities in DF, PF, Inversion and Eversion. Deceased ROM in DF of ankle joint.  Neurological: Sensation intact to light touch. Protective sensation intact.   Assessment:   1. Type 2 diabetes mellitus without complication, unspecified whether long term insulin use ([place])       Plan:  Patient was evaluated and treated and all questions answered. -Discussed and educated patient on diabetic foot care, especially with  regards to the vascular, neurological and musculoskeletal systems.  -Stressed the importance of good glycemic control and the detriment of not  controlling glucose levels in relation to the foot. -Discussed supportive shoes at all times and checking feet regularly.  -Answered all patient questions -Patient to return in 1 year for diabetic foot exam.  -Patient advised to call the office if any problems or questions arise in the meantime.   Lorenda Peck, DPM

## 2022-08-16 DIAGNOSIS — G4733 Obstructive sleep apnea (adult) (pediatric): Secondary | ICD-10-CM | POA: Diagnosis not present

## 2022-08-21 ENCOUNTER — Telehealth: Payer: Self-pay | Admitting: *Deleted

## 2022-08-21 NOTE — Telephone Encounter (Signed)
Dr York Ram office is requesting office notes for upcoming consult faxed to attn: [facility]  Notes faxed 08/21/22.

## 2022-08-23 DIAGNOSIS — H524 Presbyopia: Secondary | ICD-10-CM | POA: Diagnosis not present

## 2022-08-23 DIAGNOSIS — E119 Type 2 diabetes mellitus without complications: Secondary | ICD-10-CM | POA: Diagnosis not present

## 2022-08-23 DIAGNOSIS — H5213 Myopia, bilateral: Secondary | ICD-10-CM | POA: Diagnosis not present

## 2022-08-23 DIAGNOSIS — Z135 Encounter for screening for eye and ear disorders: Secondary | ICD-10-CM | POA: Diagnosis not present

## 2022-08-23 DIAGNOSIS — H52223 Regular astigmatism, bilateral: Secondary | ICD-10-CM | POA: Diagnosis not present

## 2022-09-03 ENCOUNTER — Ambulatory Visit: Payer: Medicare HMO

## 2022-09-03 ENCOUNTER — Other Ambulatory Visit: Payer: Self-pay

## 2022-09-03 VITALS — BP 131/90 | HR 77 | Temp 97.8°F | Resp 16 | Ht 68.0 in | Wt 292.0 lb

## 2022-09-03 DIAGNOSIS — I5032 Chronic diastolic (congestive) heart failure: Secondary | ICD-10-CM

## 2022-09-03 DIAGNOSIS — I480 Paroxysmal atrial fibrillation: Secondary | ICD-10-CM

## 2022-09-03 DIAGNOSIS — I10 Essential (primary) hypertension: Secondary | ICD-10-CM

## 2022-09-03 NOTE — Progress Notes (Addendum)
Patient is here for follow up visit.  Subjective:   Juan Snyder, male    DOB: 05-31-1966, 56 y.o.   MRN: [id number]   Chief Complaint  Patient presents with   Atrial Fibrillation   Hypertension   Follow-up    40 Month   56 year old African-American male with nonischemic cardiomyopathy, 07/2018, paroxysmal atrial fibrillation/flutter status post successful cardioversion 08/13/2018,  not a candidate for ablation due severe LAA enlargement, hypertension, type II diabetes mellitus, morbid obesity, obstructive sleep apnea on CPAP.  Patient presents today for 6 months follow-up. Blood pressure runs 140s-150s. He does not want to add any other medications. In fact, he wants to come off some medications, if possible. He wants to lose weight. He is not currently exercising and does admit that diet could be better. He has some life stressors related to his son that recently turned 7 years of age. He does endorse episode of mild shortness of breath last week but no overt dyspnea. He denies chest pain, palpitations, leg edema, orthopnea, PND, syncope.   Current Outpatient Medications:    albuterol (PROVENTIL HFA;VENTOLIN HFA) 108 (90 Base) MCG/ACT inhaler, Inhale 1 puff into the lungs every 6 (six) hours as needed for wheezing or shortness of breath., Disp: , Rfl:    amLODipine (NORVASC) 10 MG tablet, Take 0.5 tablets (5 mg total) by mouth daily. (Patient taking differently: Take 10 mg by mouth daily. Take one tablet by mouth daily), Disp: 90 tablet, Rfl: 1   ENTRESTO 97-103 MG, TAKE 1 TABLET BY MOUTH TWICE A DAY (Patient taking differently: Take 1 tablet by mouth 2 (two) times daily.), Disp: 60 tablet, Rfl: 3   fluticasone (FLONASE) 50 MCG/ACT nasal spray, Place 1 spray into both nostrils daily as needed for allergies. , Disp: , Rfl:    furosemide (LASIX) 40 MG tablet, Take 1 tablet (40 mg total) by mouth 2 (two) times daily. One tablet in morning, Additional one tablet in the afternoon for leg  swelling, weight gain,as needed (Patient taking differently: Take 40 mg by mouth 2 (two) times daily. Additional 1 tablet in the morning if needed.), Disp: 120 tablet, Rfl: 1   magnesium oxide (MAG-OX) 400 MG tablet, Take 200 mg by mouth 2 (two) times daily., Disp: , Rfl:    metFORMIN (GLUCOPHAGE) 500 MG tablet, TAKE 1 TABLET BY MOUTH 2 TIMES DAILY WITH A MEAL., Disp: 180 tablet, Rfl: 1   methocarbamol (ROBAXIN) 750 MG tablet, Take 750 mg by mouth every 8 (eight) hours as needed for muscle spasms., Disp: , Rfl:    metoprolol succinate (TOPROL-XL) 100 MG 24 hr tablet, Take 1 tablet (100 mg total) by mouth daily. Take with or immediately following a meal. (Patient taking differently: Take 100 mg by mouth daily.), Disp: 30 tablet, Rfl: 0   Multiple Vitamins-Minerals (MULTIVITAMIN PO), Take 1 tablet by mouth daily., Disp: , Rfl:    oxyCODONE-acetaminophen (PERCOCET) 10-325 MG tablet, Take 1 tablet by mouth every 4 (four) hours as needed for pain., Disp: , Rfl:    Potassium Chloride ER 20 MEQ TBCR, TAKE 10 MEQ (1/2 TAB) BY MOUTH DAILY., Disp: 45 tablet, Rfl: 3   sildenafil (REVATIO) 20 MG tablet, Take 1 tablet by mouth as needed., Disp: , Rfl:    SYMBICORT 160-4.5 MCG/ACT inhaler, Inhale 1 puff into the lungs daily., Disp: , Rfl:    tamsulosin (FLOMAX) 0.4 MG CAPS capsule, Take 0.4 mg by mouth daily. , Disp: , Rfl:    TRULICITY 1.5 HE/5.2DP SOPN,  Inject 1.5 mg into the skin once a week., Disp: , Rfl:    XARELTO 20 MG TABS tablet, TAKE 1 TABLET BY MOUTH EVERY DAY, Disp: 90 tablet, Rfl: 3   ZYLET 0.5-0.3 % SUSP, Apply 1 drop to eye., Disp: , Rfl:   Cardiovascular studies:  Echocardiogram 11/01/2021:  Normal LV systolic function with visual EF 55-60%. Left ventricle cavity  is normal in size. Mild left ventricular hypertrophy. Normal global wall  motion. Doppler evidence of grade III (restrictive) diastolic dysfunction,  elevated LAP.  Left atrial cavity is moderately dilated.  Mild (Grade I) mitral  regurgitation.  Mild tricuspid regurgitation.  Mild pulmonic regurgitation.  Compared to study 05/05/2021 LVEF improved from 40-45% to 55-60%, G3DD is  new, severe LAE is now moderate, mild TR/PR are new.   EKG 09/03/2022: Atrial fibrillation with controlled ventricular response at 85 bpm.  Normal axis.  Single PVC.  Nonspecific T wave abnormality.  Compared to previous EKG from 10/23/2021, atrial fibrillation has replaced sinus rhythm.  Echocardiogram 05/05/2020:  Left ventricle cavity is normal in size. Moderate concentric hypertrophy  of the left ventricle. Mild global hypokinesis. LVEF 40-45%. Diastolic  function indeterminate. Elevated LAP.  Left atrial cavity is severely dilated.  Moderate (Grade II) mitral regurgitation.  Normal right atrial pressure.  Compared to previous study on 04/21/2019, pulmonary hypertension now absent   R&LHC 08/12/2018: Left dominant circulation with no significant CAD Mildly elevated LVEDP (17 mmHg) Moderate WHO Grp II PH (Mean PA 33 mmHg) Mildly decompensated arrhythmia induced nonischemic cardiomyopathy. Continue guideline directed heart failure therapy, and management of Afib.  Recent labs: 03/07/2022: Cholesterol 177, triglycerides 84, HDL 59, LDL 101 Glucose 129, BUN 17, creatinine 1.26, EGFR 67 Sodium 139, potassium 4.3 Hemoglobin 13.9, hematocrit 39.1, platelets 243 Hemoglobin A1c 7.3% TSH 0.14  03/23/2021: Glucose 142, BUN/Cr 17/1.2. EGFR ?. Na/K 138/5.1.  H/H 13/39.  12/28/2020: Glucose 146, BUN/Cr 16/1.17. EGFR >60. Na/K 137/4.4. T. Bili 1.3. Rest of the CMP normal H/H 13/40. MCV 83. Platelets 249  Results for Juan, Snyder (MRN [id number]) as of 06/20/2020 12:49  Ref. Range 06/07/2020 23:23 06/08/2020 04:27 06/09/2020 08:57  B Natriuretic Peptide Latest Ref Range: 0.0 - 100.0 pg/mL 831.9 (H) 996.6 (H) 690.3 (H)    Review of Systems  Cardiovascular:  Negative for chest pain, leg swelling, palpitations and syncope.  Respiratory:   Positive for shortness of breath.   Neurological:  Negative for dizziness and light-headedness.   Objective:   Vitals:   09/03/22 1112 09/03/22 1123  BP: (!) 130/92 (!) 131/90  Pulse: 88 77  Resp: 16   Temp: 97.8 F (36.6 C)   SpO2: 95%       Physical Exam Vitals and nursing note reviewed.  Constitutional:      General: He is not in acute distress.    Appearance: He is obese.  Neck:     Vascular: No JVD.  Cardiovascular:     Rate and Rhythm: Normal rate. Rhythm irregular.     Heart sounds: Normal heart sounds. No murmur heard. Pulmonary:     Effort: Pulmonary effort is normal.     Breath sounds: Normal breath sounds. No wheezing or rales.  Musculoskeletal:     Right lower leg: No edema.     Left lower leg: No edema.      ICD-10-CM   1. Paroxysmal A-fib (HCC)  I48.0 EKG 12-Lead    2. Primary hypertension  I10     3. Chronic heart failure with preserved  ejection fraction (HCC)  I50.32      CHA2DS2-VASc Score is 3.  Yearly risk of stroke: 3.2% (3.2).  Score of 1=0.6; 2=2.2; 3=3.2; 4=4.8; 5=7.2; 6=9.8; 7=>9.8) -(CHF; HTN; vasc disease DM,  Male = 1; Age <65 =0; 65-74 = 1,  >75 =2; stroke/embolism= 2).   Medications Discontinued During This Encounter  Medication Reason   amLODipine (NORVASC) 5 MG tablet    hydrOXYzine (VISTARIL) 25 MG capsule     Assessment & Recommendations:   56 year old African-American male with nonischemic cardiomyopathy, 07/2018, paroxysmal atrial fibrillation/flutter, hypertension, type II diabetes mellitus, morbid obesity, obstructive sleep apnea on CPAP.  Nonscehmic cardiomyopathy: EF 40-45% (04/2020), now recovered to 55-60% (10/2021) with grade III DD Recent dyspnea likely more due to HFpEF with diastolic dysfunction Currently on Entresto to 97-103 mg bid,  metoprolol succinate 100 mg daily. Did not tolerate spironolactone due to gynacomastia Continue lasix 40 mg 1-2 tab/day  Paroxysmal Afib/flutter: Successful cardioversion  07/2018, not a candidate for ablation due severe LAA enlargement EKG today revealed afib with controlled ventricular response. Amiodarone was stopped during last visit since he was maintaining sinus rhythm and requesting to come off medication. He continues on metoprolol succiante 100 mg daily. He does not want to be on more medications at this time, given this and reduced EF in past with afib, will schedule outpatient cardioversion. He has been on Xarelto therefore, will not need TEE. If he has recurrence of afib post cardioversion he will need to be on antiarrythmic medication long term. He is agreeable to this plan. CHA2DS2VASc score 3, annual stroke risk 3.2% Continue Xarelto 20 mg daily. Continue management of OSA with CPAP, weight loss.  Hypertension: Stage 1 hypertension. He is reluctant to add any more medications at this time. I am optimistic that weight loss may improve his blood pressure.  OSA: Severe, on CPAP.   Type II DM: Managed by PCP. I have encouraged him to discuss with PCP regarding Ozempic/Wegovy.  Mixed hyperlipidemia: Currently not on statin.  He wants to wait and repeat his lipid panel.  Arguably, his lipids are stable, but does still need statin given that he is diabetic. Last LDL 101. He does not want to start statin at this time.  Follow-up after cardioversion or sooner if needed. Cardioversion scheduled for 09/13/22.  [facility] [place], [place] Office: [phone]

## 2022-09-10 DIAGNOSIS — I480 Paroxysmal atrial fibrillation: Secondary | ICD-10-CM | POA: Diagnosis not present

## 2022-09-10 DIAGNOSIS — I10 Essential (primary) hypertension: Secondary | ICD-10-CM | POA: Diagnosis not present

## 2022-09-10 DIAGNOSIS — I5032 Chronic diastolic (congestive) heart failure: Secondary | ICD-10-CM | POA: Diagnosis not present

## 2022-09-11 LAB — BMP8+EGFR
BUN/Creatinine Ratio: 15 (ref 9–20)
BUN: 18 mg/dL (ref 6–24)
CO2: 22 mmol/L (ref 20–29)
Calcium: 9.9 mg/dL (ref 8.7–10.2)
Chloride: 103 mmol/L (ref 96–106)
Creatinine, Ser: 1.24 mg/dL (ref 0.76–1.27)
Glucose: 134 mg/dL — ABNORMAL HIGH (ref 70–99)
Potassium: 4.5 mmol/L (ref 3.5–5.2)
Sodium: 140 mmol/L (ref 134–144)
eGFR: 68 mL/min/{1.73_m2} (ref >59)

## 2022-09-11 LAB — CBC WITH DIFFERENTIAL/PLATELET
Basophils Absolute: 0 10*3/uL (ref 0.0–0.2)
Basos: 1 %
EOS (ABSOLUTE): 0.1 10*3/uL (ref 0.0–0.4)
Eos: 2 %
Hematocrit: 39.5 % (ref 37.5–51.0)
Hemoglobin: 12.9 g/dL — ABNORMAL LOW (ref 13.0–17.7)
Immature Grans (Abs): 0 10*3/uL (ref 0.0–0.1)
Immature Granulocytes: 0 %
Lymphocytes Absolute: 1.7 10*3/uL (ref 0.7–3.1)
Lymphs: 33 %
MCH: 27.3 pg (ref 26.6–33.0)
MCHC: 32.7 g/dL (ref 31.5–35.7)
MCV: 84 fL (ref 79–97)
Monocytes Absolute: 0.6 10*3/uL (ref 0.1–0.9)
Monocytes: 11 %
Neutrophils Absolute: 2.8 10*3/uL (ref 1.4–7.0)
Neutrophils: 53 %
Platelets: 226 10*3/uL (ref 150–450)
RBC: 4.73 x10E6/uL (ref 4.14–5.80)
RDW: 14.1 % (ref 11.6–15.4)
WBC: 5.3 10*3/uL (ref 3.4–10.8)

## 2022-09-13 ENCOUNTER — Other Ambulatory Visit: Payer: Self-pay

## 2022-09-13 ENCOUNTER — Encounter (HOSPITAL_COMMUNITY)
Admission: RE | Disposition: A | Discharge status: Home / Self Care | Payer: Self-pay | Source: Home / Self Care | Attending: Cardiology

## 2022-09-13 ENCOUNTER — Encounter (HOSPITAL_COMMUNITY): Payer: Self-pay | Admitting: Cardiology

## 2022-09-13 ENCOUNTER — Ambulatory Visit (HOSPITAL_COMMUNITY): Payer: Medicare HMO | Admitting: Anesthesiology

## 2022-09-13 ENCOUNTER — Ambulatory Visit (HOSPITAL_BASED_OUTPATIENT_CLINIC_OR_DEPARTMENT_OTHER): Payer: Medicare HMO | Admitting: Anesthesiology

## 2022-09-13 ENCOUNTER — Ambulatory Visit (HOSPITAL_COMMUNITY)
Admission: RE | Admit: 2022-09-13 | Discharge: 2022-09-13 | Disposition: A | Discharge status: Home / Self Care | Payer: Medicare HMO | Attending: Cardiology | Admitting: Cardiology

## 2022-09-13 DIAGNOSIS — Z79899 Other long term (current) drug therapy: Secondary | ICD-10-CM | POA: Insufficient documentation

## 2022-09-13 DIAGNOSIS — Z6841 Body Mass Index (BMI) 40.0 and over, adult: Secondary | ICD-10-CM | POA: Diagnosis not present

## 2022-09-13 DIAGNOSIS — I428 Other cardiomyopathies: Secondary | ICD-10-CM | POA: Diagnosis not present

## 2022-09-13 DIAGNOSIS — I5032 Chronic diastolic (congestive) heart failure: Secondary | ICD-10-CM | POA: Diagnosis not present

## 2022-09-13 DIAGNOSIS — I110 Hypertensive heart disease with heart failure: Secondary | ICD-10-CM | POA: Diagnosis not present

## 2022-09-13 DIAGNOSIS — E119 Type 2 diabetes mellitus without complications: Secondary | ICD-10-CM

## 2022-09-13 DIAGNOSIS — I509 Heart failure, unspecified: Secondary | ICD-10-CM | POA: Diagnosis not present

## 2022-09-13 DIAGNOSIS — I480 Paroxysmal atrial fibrillation: Secondary | ICD-10-CM | POA: Diagnosis not present

## 2022-09-13 DIAGNOSIS — Z7901 Long term (current) use of anticoagulants: Secondary | ICD-10-CM | POA: Diagnosis not present

## 2022-09-13 DIAGNOSIS — E782 Mixed hyperlipidemia: Secondary | ICD-10-CM | POA: Insufficient documentation

## 2022-09-13 DIAGNOSIS — Z7985 Long-term (current) use of injectable non-insulin antidiabetic drugs: Secondary | ICD-10-CM | POA: Insufficient documentation

## 2022-09-13 DIAGNOSIS — G4733 Obstructive sleep apnea (adult) (pediatric): Secondary | ICD-10-CM | POA: Diagnosis not present

## 2022-09-13 DIAGNOSIS — I4892 Unspecified atrial flutter: Secondary | ICD-10-CM | POA: Insufficient documentation

## 2022-09-13 DIAGNOSIS — Z7984 Long term (current) use of oral hypoglycemic drugs: Secondary | ICD-10-CM | POA: Insufficient documentation

## 2022-09-13 DIAGNOSIS — I4891 Unspecified atrial fibrillation: Secondary | ICD-10-CM

## 2022-09-13 HISTORY — PX: CARDIOVERSION: SHX1299

## 2022-09-13 LAB — POCT I-STAT, CHEM 8
BUN: 25 mg/dL — ABNORMAL HIGH (ref 6–20)
Calcium, Ion: 1.24 mmol/L (ref 1.15–1.40)
Chloride: 104 mmol/L (ref 98–111)
Creatinine, Ser: 1.3 mg/dL — ABNORMAL HIGH (ref 0.61–1.24)
Glucose, Bld: 125 mg/dL — ABNORMAL HIGH (ref 70–99)
HCT: 40 % (ref 39.0–52.0)
Hemoglobin: 13.6 g/dL (ref 13.0–17.0)
Potassium: 4.2 mmol/L (ref 3.5–5.1)
Sodium: 139 mmol/L (ref 135–145)
TCO2: 27 mmol/L (ref 22–32)

## 2022-09-13 SURGERY — CARDIOVERSION
Anesthesia: General

## 2022-09-13 MED ORDER — SODIUM CHLORIDE 0.9 % IV SOLN: INTRAVENOUS | Status: DC

## 2022-09-13 MED ORDER — PROPOFOL 10 MG/ML IV BOLUS
INTRAVENOUS | Status: DC | PRN
  Administered 2022-09-13: 100 mg via INTRAVENOUS

## 2022-09-13 MED ORDER — LIDOCAINE 2% (20 MG/ML) 5 ML SYRINGE
INTRAMUSCULAR | Status: DC | PRN
  Administered 2022-09-13: 80 mg via INTRAVENOUS

## 2022-09-13 NOTE — Interval H&P Note (Signed)
History and Physical Interval Note:  09/13/2022 7:38 AM  Juan Snyder  has presented today for surgery, with the diagnosis of AFIB.  The various methods of treatment have been discussed with the patient and family. After consideration of risks, benefits and other options for treatment, the patient has consented to  Procedure(s): CARDIOVERSION (N/A) as a surgical intervention.  The patient's history has been reviewed, patient examined, no change in status, stable for surgery.  I have reviewed the patient's chart and labs.  Questions were answered to the patient's satisfaction.     [place]

## 2022-09-13 NOTE — CV Procedure (Signed)
Direct current cardioversion:  Indication symptomatic: Symptomatic atrial fibrillation  Procedure: Under deep sedation administered and monitored by anesthesiology, synchronized direct current cardioversion performed. Patient was delivered with 120, 200 Joules of electricity X 2 with success to NSR. Patient tolerated the procedure well. No immediate complication noted.   Emin Foree J Tangelia Sanson, MD [facility] Pager: [phone] Office: [phone] If no answer Cell [phone]    

## 2022-09-13 NOTE — Anesthesia Preprocedure Evaluation (Addendum)
Anesthesia Evaluation  Patient identified by MRN, date of birth, ID band Patient awake    Reviewed: Allergy & Precautions, NPO status , Patient's Chart, lab work & pertinent test results  Airway Mallampati: III  TM Distance: >3 FB Neck ROM: Full    Dental  (+) Teeth Intact, Dental Advisory Given   Pulmonary    breath sounds clear to auscultation       Cardiovascular hypertension, +CHF  + dysrhythmias Atrial Fibrillation  Rhythm:Irregular Rate:Normal  Echo: Normal LV systolic function with visual EF 55-60%. Left ventricle cavity  is normal in size. Mild left ventricular hypertrophy. Normal global wall  motion. Doppler evidence of grade III (restrictive) diastolic dysfunction,  elevated LAP.  Left atrial cavity is moderately dilated.  Mild (Grade I) mitral regurgitation.  Mild tricuspid regurgitation.  Mild pulmonic regurgitation.  Compared to study 05/05/2021 LVEF improved from 40-45% to 55-60%, G3DD is  new, severe LAE is now moderate, mild TR/PR are new.    Neuro/Psych    GI/Hepatic   Endo/Other  diabetes  Renal/GU      Musculoskeletal   Abdominal   Peds  Hematology   Anesthesia Other Findings   Reproductive/Obstetrics                            Anesthesia Physical Anesthesia Plan  ASA: 3  Anesthesia Plan: General   Post-op Pain Management:    Induction: Intravenous  PONV Risk Score and Plan: 0  Airway Management Planned: Mask and Natural Airway  Additional Equipment: None  Intra-op Plan:   Post-operative Plan:   Informed Consent: I have reviewed the patients History and Physical, chart, labs and discussed the procedure including the risks, benefits and alternatives for the proposed anesthesia with the patient or authorized representative who has indicated his/her understanding and acceptance.       Plan Discussed with: CRNA  Anesthesia Plan Comments:         Anesthesia Quick Evaluation

## 2022-09-13 NOTE — H&P (Signed)
OV 09/03/2022 copied for documentation    Patient is here for follow up visit.  Subjective:   Juan Snyder, male    DOB: Mar 24, 1966, 56 y.o.   MRN: [id number]   Chief Complaint  Patient presents with   Atrial Fibrillation   Hypertension   Follow-up    9 Month   56 year old African-American male with nonischemic cardiomyopathy, 07/2018, paroxysmal atrial fibrillation/flutter status post successful cardioversion 08/13/2018,  not a candidate for ablation due severe LAA enlargement, hypertension, type II diabetes mellitus, morbid obesity, obstructive sleep apnea on CPAP.  Patient presents today for 6 months follow-up. Blood pressure runs 140s-150s. He does not want to add any other medications. In fact, he wants to come off some medications, if possible. He wants to lose weight. He is not currently exercising and does admit that diet could be better. He has some life stressors related to his son that recently turned 12 years of age. He does endorse episode of mild shortness of breath last week but no overt dyspnea. He denies chest pain, palpitations, leg edema, orthopnea, PND, syncope.   Current Outpatient Medications:    albuterol (PROVENTIL HFA;VENTOLIN HFA) 108 (90 Base) MCG/ACT inhaler, Inhale 1 puff into the lungs every 6 (six) hours as needed for wheezing or shortness of breath., Disp: , Rfl:    amLODipine (NORVASC) 10 MG tablet, Take 0.5 tablets (5 mg total) by mouth daily. (Patient taking differently: Take 10 mg by mouth daily. Take one tablet by mouth daily), Disp: 90 tablet, Rfl: 1   ENTRESTO 97-103 MG, TAKE 1 TABLET BY MOUTH TWICE A DAY (Patient taking differently: Take 1 tablet by mouth 2 (two) times daily.), Disp: 60 tablet, Rfl: 3   fluticasone (FLONASE) 50 MCG/ACT nasal spray, Place 1 spray into both nostrils daily as needed for allergies. , Disp: , Rfl:    furosemide (LASIX) 40 MG tablet, Take 1 tablet (40 mg total) by mouth 2 (two) times daily. One tablet in morning,  Additional one tablet in the afternoon for leg swelling, weight gain,as needed (Patient taking differently: Take 40 mg by mouth 2 (two) times daily. Additional 1 tablet in the morning if needed.), Disp: 120 tablet, Rfl: 1   magnesium oxide (MAG-OX) 400 MG tablet, Take 200 mg by mouth 2 (two) times daily., Disp: , Rfl:    metFORMIN (GLUCOPHAGE) 500 MG tablet, TAKE 1 TABLET BY MOUTH 2 TIMES DAILY WITH A MEAL., Disp: 180 tablet, Rfl: 1   methocarbamol (ROBAXIN) 750 MG tablet, Take 750 mg by mouth every 8 (eight) hours as needed for muscle spasms., Disp: , Rfl:    metoprolol succinate (TOPROL-XL) 100 MG 24 hr tablet, Take 1 tablet (100 mg total) by mouth daily. Take with or immediately following a meal. (Patient taking differently: Take 100 mg by mouth daily.), Disp: 30 tablet, Rfl: 0   Multiple Vitamins-Minerals (MULTIVITAMIN PO), Take 1 tablet by mouth daily., Disp: , Rfl:    oxyCODONE-acetaminophen (PERCOCET) 10-325 MG tablet, Take 1 tablet by mouth every 4 (four) hours as needed for pain., Disp: , Rfl:    Potassium Chloride ER 20 MEQ TBCR, TAKE 10 MEQ (1/2 TAB) BY MOUTH DAILY., Disp: 45 tablet, Rfl: 3   sildenafil (REVATIO) 20 MG tablet, Take 1 tablet by mouth as needed., Disp: , Rfl:    SYMBICORT 160-4.5 MCG/ACT inhaler, Inhale 1 puff into the lungs daily., Disp: , Rfl:    tamsulosin (FLOMAX) 0.4 MG CAPS capsule, Take 0.4 mg by mouth daily. , Disp: , Rfl:  TRULICITY 1.5 YM/4.1RA SOPN, Inject 1.5 mg into the skin once a week., Disp: , Rfl:    XARELTO 20 MG TABS tablet, TAKE 1 TABLET BY MOUTH EVERY DAY, Disp: 90 tablet, Rfl: 3   ZYLET 0.5-0.3 % SUSP, Apply 1 drop to eye., Disp: , Rfl:   Cardiovascular studies:  Echocardiogram 11/01/2021:  Normal LV systolic function with visual EF 55-60%. Left ventricle cavity  is normal in size. Mild left ventricular hypertrophy. Normal global wall  motion. Doppler evidence of grade III (restrictive) diastolic dysfunction,  elevated LAP.  Left atrial cavity  is moderately dilated.  Mild (Grade I) mitral regurgitation.  Mild tricuspid regurgitation.  Mild pulmonic regurgitation.  Compared to study 05/05/2021 LVEF improved from 40-45% to 55-60%, G3DD is  new, severe LAE is now moderate, mild TR/PR are new.   EKG 09/03/2022: Atrial fibrillation with controlled ventricular response at 85 bpm.  Normal axis.  Single PVC.  Nonspecific T wave abnormality.  Compared to previous EKG from 10/23/2021, atrial fibrillation has replaced sinus rhythm.  Echocardiogram 05/05/2020:  Left ventricle cavity is normal in size. Moderate concentric hypertrophy  of the left ventricle. Mild global hypokinesis. LVEF 40-45%. Diastolic  function indeterminate. Elevated LAP.  Left atrial cavity is severely dilated.  Moderate (Grade II) mitral regurgitation.  Normal right atrial pressure.  Compared to previous study on 04/21/2019, pulmonary hypertension now absent   R&LHC 08/12/2018: Left dominant circulation with no significant CAD Mildly elevated LVEDP (17 mmHg) Moderate WHO Grp II PH (Mean PA 33 mmHg) Mildly decompensated arrhythmia induced nonischemic cardiomyopathy. Continue guideline directed heart failure therapy, and management of Afib.  Recent labs: 03/07/2022: Cholesterol 177, triglycerides 84, HDL 59, LDL 101 Glucose 129, BUN 17, creatinine 1.26, EGFR 67 Sodium 139, potassium 4.3 Hemoglobin 13.9, hematocrit 39.1, platelets 243 Hemoglobin A1c 7.3% TSH 0.14  03/23/2021: Glucose 142, BUN/Cr 17/1.2. EGFR ?. Na/K 138/5.1.  H/H 13/39.  12/28/2020: Glucose 146, BUN/Cr 16/1.17. EGFR >60. Na/K 137/4.4. T. Bili 1.3. Rest of the CMP normal H/H 13/40. MCV 83. Platelets 249  Results for JAQUEZ, FARRINGTON (MRN [id number]) as of 06/20/2020 12:49  Ref. Range 06/07/2020 23:23 06/08/2020 04:27 06/09/2020 08:57  B Natriuretic Peptide Latest Ref Range: 0.0 - 100.0 pg/mL 831.9 (H) 996.6 (H) 690.3 (H)    Review of Systems  Cardiovascular:  Negative for chest pain, leg swelling,  palpitations and syncope.  Respiratory:  Positive for shortness of breath.   Neurological:  Negative for dizziness and light-headedness.   Objective:   Vitals:   09/03/22 1112 09/03/22 1123  BP: (!) 130/92 (!) 131/90  Pulse: 88 77  Resp: 16   Temp: 97.8 F (36.6 C)   SpO2: 95%       Physical Exam Vitals and nursing note reviewed.  Constitutional:      General: He is not in acute distress.    Appearance: He is obese.  Neck:     Vascular: No JVD.  Cardiovascular:     Rate and Rhythm: Normal rate. Rhythm irregular.     Heart sounds: Normal heart sounds. No murmur heard. Pulmonary:     Effort: Pulmonary effort is normal.     Breath sounds: Normal breath sounds. No wheezing or rales.  Musculoskeletal:     Right lower leg: No edema.     Left lower leg: No edema.      ICD-10-CM   1. Paroxysmal A-fib (HCC)  I48.0 EKG 12-Lead    2. Primary hypertension  I10     3. Chronic  heart failure with preserved ejection fraction (HCC)  I50.32      CHA2DS2-VASc Score is 3.  Yearly risk of stroke: 3.2% (3.2).  Score of 1=0.6; 2=2.2; 3=3.2; 4=4.8; 5=7.2; 6=9.8; 7=>9.8) -(CHF; HTN; vasc disease DM,  Male = 1; Age <65 =0; 65-74 = 1,  >75 =2; stroke/embolism= 2).   Medications Discontinued During This Encounter  Medication Reason   amLODipine (NORVASC) 5 MG tablet    hydrOXYzine (VISTARIL) 25 MG capsule     Assessment & Recommendations:   56 year old African-American male with nonischemic cardiomyopathy, 07/2018, paroxysmal atrial fibrillation/flutter, hypertension, type II diabetes mellitus, morbid obesity, obstructive sleep apnea on CPAP.  Nonscehmic cardiomyopathy: EF 40-45% (04/2020), now recovered to 55-60% (10/2021) with grade III DD Recent dyspnea likely more due to HFpEF with diastolic dysfunction Currently on Entresto to 97-103 mg bid,  metoprolol succinate 100 mg daily. Did not tolerate spironolactone due to gynacomastia Continue lasix 40 mg 1-2 tab/day  Paroxysmal  Afib/flutter: Successful cardioversion 07/2018, not a candidate for ablation due severe LAA enlargement EKG today revealed afib with controlled ventricular response. Amiodarone was stopped during last visit since he was maintaining sinus rhythm and requesting to come off medication. He continues on metoprolol succiante 100 mg daily. He does not want to be on more medications at this time, given this and reduced EF in past with afib, will schedule outpatient cardioversion. He has been on Xarelto therefore, will not need TEE. If he has recurrence of afib post cardioversion he will need to be on antiarrythmic medication long term. He is agreeable to this plan. CHA2DS2VASc score 3, annual stroke risk 3.2% Continue Xarelto 20 mg daily. Continue management of OSA with CPAP, weight loss.  Hypertension: Stage 1 hypertension. He is reluctant to add any more medications at this time. I am optimistic that weight loss may improve his blood pressure.  OSA: Severe, on CPAP.   Type II DM: Managed by PCP. I have encouraged him to discuss with PCP regarding Ozempic/Wegovy.  Mixed hyperlipidemia: Currently not on statin.  He wants to wait and repeat his lipid panel.  Arguably, his lipids are stable, but does still need statin given that he is diabetic. Last LDL 101. He does not want to start statin at this time.  Follow-up after cardioversion or sooner if needed. Cardioversion scheduled for 09/13/22.  [facility] [place], [place] Office: [phone]

## 2022-09-13 NOTE — Discharge Instructions (Signed)

## 2022-09-13 NOTE — Anesthesia Postprocedure Evaluation (Signed)
Anesthesia Post Note  Patient: Juan Snyder  Procedure(s) Performed: CARDIOVERSION     Patient location during evaluation: PACU Anesthesia Type: General Level of consciousness: awake and alert Pain management: pain level controlled Vital Signs Assessment: post-procedure vital signs reviewed and stable Respiratory status: spontaneous breathing, nonlabored ventilation, respiratory function stable and patient connected to nasal cannula oxygen Cardiovascular status: blood pressure returned to baseline and stable Postop Assessment: no apparent nausea or vomiting Anesthetic complications: no   No notable events documented.  Last Vitals:  Vitals:   09/13/22 0810 09/13/22 0818  BP: (!) 139/96   Pulse: 68 65  Resp: 15 (!) 22  Temp:    SpO2: 95% 96%    Last Pain:  Vitals:   09/13/22 0818  TempSrc:   PainSc: 0-No pain                 Effie Berkshire

## 2022-09-13 NOTE — Transfer of Care (Signed)
Immediate Anesthesia Transfer of Care Note  Patient: Juan Snyder  Procedure(s) Performed: CARDIOVERSION  Patient Location: PACU and Endoscopy Unit  Anesthesia Type:General  Level of Consciousness: awake and alert   Airway & Oxygen Therapy: Patient Spontanous Breathing  Post-op Assessment: Report given to RN and Post -op Vital signs reviewed and stable  Post vital signs: Reviewed and stable  Last Vitals:  Vitals Value Taken Time  BP 132/80   Temp    Pulse 66   Resp 12   SpO2 97%     Last Pain:  Vitals:   09/13/22 0647  TempSrc: Temporal  PainSc: 6          Complications: No notable events documented.

## 2022-09-16 ENCOUNTER — Encounter (HOSPITAL_COMMUNITY): Payer: Self-pay | Admitting: Cardiology

## 2022-09-20 DIAGNOSIS — G4733 Obstructive sleep apnea (adult) (pediatric): Secondary | ICD-10-CM | POA: Diagnosis not present

## 2022-09-27 ENCOUNTER — Ambulatory Visit: Payer: Medicare HMO

## 2022-09-27 VITALS — BP 146/91 | HR 66 | Ht 69.0 in | Wt 294.0 lb

## 2022-09-27 DIAGNOSIS — I10 Essential (primary) hypertension: Secondary | ICD-10-CM | POA: Diagnosis not present

## 2022-09-27 DIAGNOSIS — I5032 Chronic diastolic (congestive) heart failure: Secondary | ICD-10-CM

## 2022-09-27 DIAGNOSIS — I480 Paroxysmal atrial fibrillation: Secondary | ICD-10-CM

## 2022-09-27 NOTE — Progress Notes (Signed)
Patient is here for follow up visit.  Subjective:   Juan Snyder, male    DOB: 08-Nov-1966, 56 y.o.   MRN: [id number]   Chief Complaint  Patient presents with   Follow-up   Hypertension   Congestive Heart Failure   Atrial Fibrillation   56 year old African-American male with nonischemic cardiomyopathy, 07/2018, paroxysmal atrial fibrillation/flutter status post successful cardioversion 08/13/2018,  not a candidate for ablation due severe LAA enlargement, hypertension, type II diabetes mellitus, morbid obesity, obstructive sleep apnea on CPAP.  Patient presents today for follow-up after cardioversion.  He was last seen in office on 09/03/2022 for shortness of breath and was found to be in atrial fibrillation with controlled ventricular response.  He was previously on amiodarone but this was stopped since he was maintaining sinus rhythm and he continued on metoprolol 100 mg daily.  It was felt that his shortness of breath is related to recurrence of atrial fibrillation therefore he was scheduled for direct-current cardioversion.  He underwent cardioversion on 09/13/2022 with successful conversion to normal sinus rhythm.  Today is stable from a cardiovascular standpoint. He denies chest pain, shortness of breath, palpitations, leg edema, orthopnea, PND, TIA/syncope.   Current Outpatient Medications:    albuterol (PROVENTIL HFA;VENTOLIN HFA) 108 (90 Base) MCG/ACT inhaler, Inhale 1 puff into the lungs every 6 (six) hours as needed for wheezing or shortness of breath., Disp: , Rfl:    amLODipine (NORVASC) 10 MG tablet, Take 10 mg by mouth daily., Disp: , Rfl:    ENTRESTO 97-103 MG, TAKE 1 TABLET BY MOUTH TWICE A DAY (Patient taking differently: Take 1 tablet by mouth 2 (two) times daily.), Disp: 60 tablet, Rfl: 3   fluticasone (FLONASE) 50 MCG/ACT nasal spray, Place 1 spray into both nostrils daily as needed for allergies. , Disp: , Rfl:    furosemide (LASIX) 40 MG tablet, Take 1 tablet (40 mg  total) by mouth 2 (two) times daily. One tablet in morning, Additional one tablet in the afternoon for leg swelling, weight gain,as needed (Patient taking differently: Take 40 mg by mouth 2 (two) times daily. Additional 1 tablet in the morning if needed.), Disp: 120 tablet, Rfl: 1   magnesium oxide (MAG-OX) 400 MG tablet, Take 200 mg by mouth 2 (two) times daily., Disp: , Rfl:    metFORMIN (GLUCOPHAGE) 500 MG tablet, TAKE 1 TABLET BY MOUTH 2 TIMES DAILY WITH A MEAL., Disp: 180 tablet, Rfl: 1   methocarbamol (ROBAXIN) 750 MG tablet, Take 750 mg by mouth every 8 (eight) hours as needed for muscle spasms., Disp: , Rfl:    metoprolol succinate (TOPROL-XL) 100 MG 24 hr tablet, Take 1 tablet (100 mg total) by mouth daily. Take with or immediately following a meal. (Patient taking differently: Take 100 mg by mouth daily.), Disp: 30 tablet, Rfl: 0   Multiple Vitamins-Minerals (MULTIVITAMIN PO), Take 1 tablet by mouth daily., Disp: , Rfl:    oxyCODONE-acetaminophen (PERCOCET) 10-325 MG tablet, Take 1 tablet by mouth every 4 (four) hours as needed for pain., Disp: , Rfl:    Potassium Chloride ER 20 MEQ TBCR, TAKE 10 MEQ (1/2 TAB) BY MOUTH DAILY., Disp: 45 tablet, Rfl: 3   sildenafil (REVATIO) 20 MG tablet, Take 1 tablet by mouth as needed., Disp: , Rfl:    SYMBICORT 160-4.5 MCG/ACT inhaler, Inhale 1 puff into the lungs daily., Disp: , Rfl:    tamsulosin (FLOMAX) 0.4 MG CAPS capsule, Take 0.4 mg by mouth daily. , Disp: , Rfl:  TRULICITY 1.5 PT/4.6FK SOPN, Inject 3 mg into the skin once a week., Disp: , Rfl:    XARELTO 20 MG TABS tablet, TAKE 1 TABLET BY MOUTH EVERY DAY, Disp: 90 tablet, Rfl: 3  Cardiovascular studies:  Echocardiogram 11/01/2021:  Normal LV systolic function with visual EF 55-60%. Left ventricle cavity  is normal in size. Mild left ventricular hypertrophy. Normal global wall  motion. Doppler evidence of grade III (restrictive) diastolic dysfunction,  elevated LAP.  Left atrial cavity is  moderately dilated.  Mild (Grade I) mitral regurgitation.  Mild tricuspid regurgitation.  Mild pulmonic regurgitation.  Compared to study 05/05/2021 LVEF improved from 40-45% to 55-60%, G3DD is  new, severe LAE is now moderate, mild TR/PR are new.   EKG 09/27/2022: Normal sinus rhythm at rate of 63 bpm.  Single PAC.  Normal axis.  Diffuse nonspecific T wave abnormality.  Compared to previous EKG on 09/03/2029, sinus rhythm replaces atrial fibrillation.  EKG 09/03/2022: Atrial fibrillation with controlled ventricular response at 85 bpm.  Normal axis.  Single PVC.  Nonspecific T wave abnormality.  Compared to previous EKG from 10/23/2021, atrial fibrillation has replaced sinus rhythm.  Echocardiogram 05/05/2020:  Left ventricle cavity is normal in size. Moderate concentric hypertrophy  of the left ventricle. Mild global hypokinesis. LVEF 40-45%. Diastolic  function indeterminate. Elevated LAP.  Left atrial cavity is severely dilated.  Moderate (Grade II) mitral regurgitation.  Normal right atrial pressure.  Compared to previous study on 04/21/2019, pulmonary hypertension now absent   R&LHC 08/12/2018: Left dominant circulation with no significant CAD Mildly elevated LVEDP (17 mmHg) Moderate WHO Grp II PH (Mean PA 33 mmHg) Mildly decompensated arrhythmia induced nonischemic cardiomyopathy. Continue guideline directed heart failure therapy, and management of Afib.  Recent labs: 03/07/2022: Cholesterol 177, triglycerides 84, HDL 59, LDL 101 Glucose 129, BUN 17, creatinine 1.26, EGFR 67 Sodium 139, potassium 4.3 Hemoglobin 13.9, hematocrit 39.1, platelets 243 Hemoglobin A1c 7.3% TSH 0.14  03/23/2021: Glucose 142, BUN/Cr 17/1.2. EGFR ?. Na/K 138/5.1.  H/H 13/39.  12/28/2020: Glucose 146, BUN/Cr 16/1.17. EGFR >60. Na/K 137/4.4. T. Bili 1.3. Rest of the CMP normal H/H 13/40. MCV 83. Platelets 249  Results for DASHAWN, BARTNICK (MRN [id number]) as of 06/20/2020 12:49  Ref. Range 06/07/2020 23:23  06/08/2020 04:27 06/09/2020 08:57  B Natriuretic Peptide Latest Ref Range: 0.0 - 100.0 pg/mL 831.9 (H) 996.6 (H) 690.3 (H)    Review of Systems  Cardiovascular:  Negative for chest pain, leg swelling, palpitations and syncope.  Respiratory:  Negative for shortness of breath.   Neurological:  Negative for dizziness and light-headedness.   Objective:   Vitals:   09/27/22 1136 09/27/22 1140  BP: (!) 159/98 (!) 146/91  Pulse: 66   SpO2: 96%        Physical Exam Vitals and nursing note reviewed.  Constitutional:      General: He is not in acute distress.    Appearance: He is obese.  Neck:     Vascular: No JVD.  Cardiovascular:     Rate and Rhythm: Normal rate and regular rhythm.     Pulses: Normal pulses.     Heart sounds: Normal heart sounds. No murmur heard. Pulmonary:     Effort: Pulmonary effort is normal.     Breath sounds: Normal breath sounds. No wheezing or rales.  Musculoskeletal:     Right lower leg: No edema.     Left lower leg: No edema.      ICD-10-CM   1. Paroxysmal A-fib ([place])  I48.0 EKG 12-Lead    2. Primary hypertension  I10     3. Chronic heart failure with preserved ejection fraction (HCC)  I50.32       CHA2DS2-VASc Score is 3.  Yearly risk of stroke: 3.2% (3.2).  Score of 1=0.6; 2=2.2; 3=3.2; 4=4.8; 5=7.2; 6=9.8; 7=>9.8) -(CHF; HTN; vasc disease DM,  Male = 1; Age <65 =0; 65-74 = 1,  >75 =2; stroke/embolism= 2).   Medications Discontinued During This Encounter  Medication Reason   ZYLET 0.5-0.3 % SUSP Completed Course   amLODipine (NORVASC) 10 MG tablet Discontinued by provider     Assessment & Recommendations:   56 year old African-American male with nonischemic cardiomyopathy, 07/2018, paroxysmal atrial fibrillation/flutter, hypertension, type II diabetes mellitus, morbid obesity, obstructive sleep apnea on CPAP.  Paroxysmal Afib/flutter: Successful cardioversion 09/13/2022, not a candidate for ablation due severe LAA enlargement EKG  today revealed normal sinus rhythm with single PAC. He continues on metoprolol succiante 100 mg daily. If he has recurrence of afib post cardioversion he will need to be on antiarrythmic medication long term. He is agreeable to this plan. CHA2DS2VASc score 3, annual stroke risk 3.2% He is tolerating Xarelto without any bleeding diathesis. Continue management of OSA with CPAP, weight loss.  Primary hypertension: Stage 1 hypertension. He is reluctant to add any more medications at this time. Blood pressure remains slightly elevated, patient does not want to add any more medications at this time. Discussed the importance of lifestyle modifications including diet, exercise, weight loss. Advised low-sodium diet less than 2000 mg daily.  Feel that exercise and weight loss can help better control blood pressure.  HFpEF: EF 40-45% (04/2020), now recovered to 55-60% (10/2021) with grade III DD. Currently on Entresto to 97-103 mg bid,  metoprolol succinate 100 mg daily. Did not tolerate spironolactone due to gynacomastia. Continue lasix 40 mg 1-2 tab/day.  Follow-up in 6 months or sooner if needed.   [facility], [facility] [place], [place] Office: [phone]

## 2022-09-27 NOTE — Addendum Note (Signed)
Addended by: Shelia Magallon on: 09/27/2022 08:56 PM   Modules accepted: Level of Service  

## 2022-10-05 DIAGNOSIS — R3912 Poor urinary stream: Secondary | ICD-10-CM | POA: Diagnosis not present

## 2022-10-05 DIAGNOSIS — N401 Enlarged prostate with lower urinary tract symptoms: Secondary | ICD-10-CM | POA: Diagnosis not present

## 2022-10-05 DIAGNOSIS — N5201 Erectile dysfunction due to arterial insufficiency: Secondary | ICD-10-CM | POA: Diagnosis not present

## 2022-10-18 ENCOUNTER — Other Ambulatory Visit: Payer: Self-pay | Admitting: Cardiology

## 2022-10-18 ENCOUNTER — Encounter: Payer: Self-pay | Admitting: Cardiology

## 2022-10-18 DIAGNOSIS — I480 Paroxysmal atrial fibrillation: Secondary | ICD-10-CM

## 2022-10-18 NOTE — Telephone Encounter (Signed)
ON-CALL CARDIOLOGY 10/18/22  Patient's name: Juan Snyder.   MRN: [id number].    DOB: [birth year] Primary care provider: Jackie Plum, MD. Primary cardiologist: Truett Mainland, MD, [facility]  Chief Complaint  Patient presents with   Medication Refill    Interaction regarding this patient's care today: Patient requesting Xarelto refill.  Impression:   ICD-10-CM   1. Paroxysmal A-fib (HCC)  I48.0       Meds ordered this encounter  Medications   XARELTO 20 MG TABS tablet    Sig: TAKE 1 TABLET BY MOUTH EVERY DAY    Dispense:  30 tablet    Refill:  11    No orders of the defined types were placed in this encounter.   Recommendations: Xarelto refilled as requested.  Telephone encounter total time: 2 minutes    Nori Riis, [place] Pager: [phone] Office: [phone]

## 2022-10-20 DIAGNOSIS — G4733 Obstructive sleep apnea (adult) (pediatric): Secondary | ICD-10-CM | POA: Diagnosis not present

## 2022-10-28 ENCOUNTER — Encounter (HOSPITAL_BASED_OUTPATIENT_CLINIC_OR_DEPARTMENT_OTHER): Payer: Self-pay | Admitting: Emergency Medicine

## 2022-10-28 ENCOUNTER — Emergency Department (HOSPITAL_BASED_OUTPATIENT_CLINIC_OR_DEPARTMENT_OTHER): Payer: Medicare HMO

## 2022-10-28 ENCOUNTER — Other Ambulatory Visit: Payer: Self-pay

## 2022-10-28 ENCOUNTER — Emergency Department (HOSPITAL_BASED_OUTPATIENT_CLINIC_OR_DEPARTMENT_OTHER)
Admission: EM | Admit: 2022-10-28 | Discharge: 2022-10-28 | Disposition: A | Discharge status: Home / Self Care | Payer: Medicare HMO | Attending: Emergency Medicine | Admitting: Emergency Medicine

## 2022-10-28 ENCOUNTER — Ambulatory Visit (HOSPITAL_COMMUNITY)
Admission: EM | Admit: 2022-10-28 | Discharge: 2022-10-28 | Disposition: A | Discharge status: Home / Self Care | Payer: Medicare HMO

## 2022-10-28 DIAGNOSIS — I4891 Unspecified atrial fibrillation: Secondary | ICD-10-CM | POA: Insufficient documentation

## 2022-10-28 DIAGNOSIS — G8929 Other chronic pain: Secondary | ICD-10-CM | POA: Diagnosis not present

## 2022-10-28 DIAGNOSIS — Z7984 Long term (current) use of oral hypoglycemic drugs: Secondary | ICD-10-CM | POA: Diagnosis not present

## 2022-10-28 DIAGNOSIS — I110 Hypertensive heart disease with heart failure: Secondary | ICD-10-CM | POA: Insufficient documentation

## 2022-10-28 DIAGNOSIS — I700 Atherosclerosis of aorta: Secondary | ICD-10-CM | POA: Diagnosis not present

## 2022-10-28 DIAGNOSIS — Z79899 Other long term (current) drug therapy: Secondary | ICD-10-CM | POA: Diagnosis not present

## 2022-10-28 DIAGNOSIS — R1030 Lower abdominal pain, unspecified: Secondary | ICD-10-CM

## 2022-10-28 DIAGNOSIS — Z7901 Long term (current) use of anticoagulants: Secondary | ICD-10-CM | POA: Insufficient documentation

## 2022-10-28 DIAGNOSIS — I509 Heart failure, unspecified: Secondary | ICD-10-CM | POA: Insufficient documentation

## 2022-10-28 DIAGNOSIS — N410 Acute prostatitis: Secondary | ICD-10-CM | POA: Diagnosis not present

## 2022-10-28 DIAGNOSIS — N419 Inflammatory disease of prostate, unspecified: Secondary | ICD-10-CM

## 2022-10-28 DIAGNOSIS — R1031 Right lower quadrant pain: Secondary | ICD-10-CM | POA: Diagnosis not present

## 2022-10-28 DIAGNOSIS — R109 Unspecified abdominal pain: Secondary | ICD-10-CM | POA: Diagnosis not present

## 2022-10-28 LAB — COMPREHENSIVE METABOLIC PANEL
ALT: 12 U/L (ref 0–44)
AST: 17 U/L (ref 15–41)
Albumin: 4.3 g/dL (ref 3.5–5.0)
Alkaline Phosphatase: 44 U/L (ref 38–126)
Anion gap: 11 (ref 5–15)
BUN: 16 mg/dL (ref 6–20)
CO2: 24 mmol/L (ref 22–32)
Calcium: 9.2 mg/dL (ref 8.9–10.3)
Chloride: 103 mmol/L (ref 98–111)
Creatinine, Ser: 1.26 mg/dL — ABNORMAL HIGH (ref 0.61–1.24)
GFR, Estimated: >60 mL/min (ref >60)
Glucose, Bld: 103 mg/dL — ABNORMAL HIGH (ref 70–99)
Potassium: 4.4 mmol/L (ref 3.5–5.1)
Sodium: 138 mmol/L (ref 135–145)
Total Bilirubin: 1 mg/dL (ref 0.3–1.2)
Total Protein: 7.3 g/dL (ref 6.5–8.1)

## 2022-10-28 LAB — LIPASE, BLOOD: Lipase: 90 U/L — ABNORMAL HIGH (ref 11–51)

## 2022-10-28 LAB — URINALYSIS, ROUTINE W REFLEX MICROSCOPIC
Bilirubin Urine: NEGATIVE
Glucose, UA: NEGATIVE mg/dL
Hgb urine dipstick: NEGATIVE
Ketones, ur: NEGATIVE mg/dL
Leukocytes,Ua: NEGATIVE
Nitrite: NEGATIVE
Specific Gravity, Urine: 1.02 (ref 1.005–1.030)
pH: 5 (ref 5.0–8.0)

## 2022-10-28 LAB — CBC
HCT: 38 % — ABNORMAL LOW (ref 39.0–52.0)
Hemoglobin: 12.7 g/dL — ABNORMAL LOW (ref 13.0–17.0)
MCH: 27.9 pg (ref 26.0–34.0)
MCHC: 33.4 g/dL (ref 30.0–36.0)
MCV: 83.5 fL (ref 80.0–100.0)
Platelets: 237 10*3/uL (ref 150–400)
RBC: 4.55 MIL/uL (ref 4.22–5.81)
RDW: 14.1 % (ref 11.5–15.5)
WBC: 5.9 10*3/uL (ref 4.0–10.5)
nRBC: 0 % (ref 0.0–0.2)

## 2022-10-28 MED ORDER — CIPROFLOXACIN HCL 500 MG PO TABS: 500.0000 mg | ORAL_TABLET | Freq: Two times a day (BID) | ORAL | 0 refills | Status: DC

## 2022-10-28 MED ORDER — MORPHINE SULFATE (PF) 4 MG/ML IV SOLN
4.0000 mg | Freq: Once | INTRAVENOUS | Status: AC
  Administered 2022-10-28: 4 mg via INTRAVENOUS
  Filled 2022-10-28: qty 1

## 2022-10-28 MED ORDER — IOHEXOL 300 MG/ML  SOLN
100.0000 mL | Freq: Once | INTRAMUSCULAR | Status: AC | PRN
  Administered 2022-10-28: 100 mL via INTRAVENOUS

## 2022-10-28 MED ORDER — ONDANSETRON HCL 4 MG/2ML IJ SOLN
4.0000 mg | Freq: Once | INTRAMUSCULAR | Status: AC
  Administered 2022-10-28: 4 mg via INTRAVENOUS
  Filled 2022-10-28: qty 2

## 2022-10-28 MED ORDER — DICYCLOMINE HCL 20 MG PO TABS: 20.0000 mg | ORAL_TABLET | Freq: Two times a day (BID) | ORAL | 0 refills | Status: DC

## 2022-10-28 NOTE — ED Triage Notes (Signed)
Lower abdominal pain started around noon. Denies n/v/d. Denies urinary symptoms Sharp and constant

## 2022-10-28 NOTE — ED Provider Notes (Signed)
[facility] EMERGENCY DEPT Provider Note   CSN: [id number] Arrival date & time: 10/28/22  1611     History  Chief Complaint  Patient presents with   Abdominal Pain    Juan Snyder is a 56 y.o. male history of obesity, CHF, chronic pain, hypertension, prior inguinal hernia here for evaluation of abdominal pain.  Began around 1 PM today.  Located suprapubic region and to the right.  States he was laying down, went to stand up and felt pain to his right mid and lower abdomen.  Worse with movement, palpation.  He has chronic lower back pain which mains unchanged.  No fever, nausea, vomiting, chest pain, testicular pain, dysuria, hematuria.  Took home oxycodone which helped however still is persistent pain.  On Xarelto for A-fib.  No changes in bowel movements.  No melena or bright blood per rectum.  Pain does not feel like his prior hernias.  HPI     Home Medications Prior to Admission medications   Medication Sig Start Date End Date Taking? Authorizing Provider  ciprofloxacin (CIPRO) 500 MG tablet Take 1 tablet (500 mg total) by mouth every 12 (twelve) hours. 10/28/22  Yes Leotha Westermeyer A, PA-C  dicyclomine (BENTYL) 20 MG tablet Take 1 tablet (20 mg total) by mouth 2 (two) times daily. 10/28/22  Yes Revan Gendron A, PA-C  albuterol (PROVENTIL HFA;VENTOLIN HFA) 108 (90 Base) MCG/ACT inhaler Inhale 1 puff into the lungs every 6 (six) hours as needed for wheezing or shortness of breath.    [provider]  amLODipine (NORVASC) 10 MG tablet Take 10 mg by mouth daily.    [provider]  ENTRESTO 97-103 MG TAKE 1 TABLET BY MOUTH TWICE A DAY Patient taking differently: Take 1 tablet by mouth 2 (two) times daily. 12/27/20   Patwardhan, Anabel Bene, MD  fluticasone (FLONASE) 50 MCG/ACT nasal spray Place 1 spray into both nostrils daily as needed for allergies.     [provider]  furosemide (LASIX) 40 MG tablet Take 1 tablet (40 mg total) by mouth 2  (two) times daily. One tablet in morning, Additional one tablet in the afternoon for leg swelling, weight gain,as needed Patient taking differently: Take 40 mg by mouth 2 (two) times daily. Additional 1 tablet in the morning if needed. 07/18/20   Patwardhan, Anabel Bene, MD  magnesium oxide (MAG-OX) 400 MG tablet Take 200 mg by mouth 2 (two) times daily.    [provider]  metFORMIN (GLUCOPHAGE) 500 MG tablet TAKE 1 TABLET BY MOUTH 2 TIMES DAILY WITH A MEAL. 02/27/21   Patwardhan, Anabel Bene, MD  methocarbamol (ROBAXIN) 750 MG tablet Take 750 mg by mouth every 8 (eight) hours as needed for muscle spasms.    [provider]  metoprolol succinate (TOPROL-XL) 100 MG 24 hr tablet Take 1 tablet (100 mg total) by mouth daily. Take with or immediately following a meal. Patient taking differently: Take 100 mg by mouth daily. 08/15/18   Lenox Ponds, MD  Multiple Vitamins-Minerals (MULTIVITAMIN PO) Take 1 tablet by mouth daily.    [provider]  oxyCODONE-acetaminophen (PERCOCET) 10-325 MG tablet Take 1 tablet by mouth every 4 (four) hours as needed for pain.    [provider]  Potassium Chloride ER 20 MEQ TBCR TAKE 10 MEQ (1/2 TAB) BY MOUTH DAILY. 11/23/21   Patwardhan, Manish J, MD  sildenafil (REVATIO) 20 MG tablet Take 1 tablet by mouth as needed. 08/02/21   [provider]  [facility]  160-4.5 MCG/ACT inhaler Inhale 1 puff into the lungs daily. 10/22/21   [provider]  tamsulosin (FLOMAX) 0.4 MG CAPS capsule Take 0.4 mg by mouth daily.     [provider]  TRULICITY 1.5 MG/0.5ML SOPN Inject 3 mg into the skin once a week. 08/16/22   [provider]  XARELTO 20 MG TABS tablet TAKE 1 TABLET BY MOUTH EVERY DAY 10/18/22   Nori Riis, NP      Allergies    Patient has no known allergies.    Review of Systems   Review of Systems  Constitutional: Negative.   HENT: Negative.    Respiratory: Negative.    Cardiovascular:  Negative.   Gastrointestinal:  Positive for abdominal pain. Negative for abdominal distention, anal bleeding, blood in stool, constipation, diarrhea, nausea, rectal pain and vomiting.  Genitourinary: Negative.   Musculoskeletal: Negative.   Skin: Negative.   Neurological: Negative.   All other systems reviewed and are negative.   Physical Exam Updated Vital Signs BP (!) 102/47 (BP Location: Left Arm)   Pulse 66   Temp 97.8 F (36.6 C) (Oral)   Resp 18   SpO2 93%  Physical Exam Vitals and nursing note reviewed.  Constitutional:      General: He is not in acute distress.    Appearance: He is well-developed. He is obese. He is not ill-appearing, toxic-appearing or diaphoretic.  HENT:     Head: Normocephalic and atraumatic.  Eyes:     Pupils: Pupils are equal, round, and reactive to light.  Cardiovascular:     Rate and Rhythm: Normal rate and regular rhythm.     Heart sounds: Normal heart sounds.  Pulmonary:     Effort: Pulmonary effort is normal. No respiratory distress.     Breath sounds: Normal breath sounds.     Comments: Clear bilaterally, speaks in full sentences without difficulty Abdominal:     General: There is no distension.     Palpations: Abdomen is soft.     Tenderness: There is generalized abdominal tenderness and tenderness in the right lower quadrant and suprapubic area. There is no right CVA tenderness, left CVA tenderness, guarding or rebound.     Comments: Exam limited due to body habitus however tenderness to lower abdomen, worse at right lower quadrant and mid abdomen  Musculoskeletal:        General: Normal range of motion.     Cervical back: Normal range of motion and neck supple.  Skin:    General: Skin is warm and dry.  Neurological:     General: No focal deficit present.     Mental Status: He is alert and oriented to person, place, and time.    ED Results / Procedures / Treatments   Labs (all labs ordered are listed, but only abnormal results  are displayed) Labs Reviewed  LIPASE, BLOOD - Abnormal; Notable for the following components:      Result Value   Lipase 90 (*)    All other components within normal limits  COMPREHENSIVE METABOLIC PANEL - Abnormal; Notable for the following components:   Glucose, Bld 103 (*)    Creatinine, Ser 1.26 (*)    All other components within normal limits  CBC - Abnormal; Notable for the following components:   Hemoglobin 12.7 (*)    HCT 38.0 (*)    All other components within normal limits  URINALYSIS, ROUTINE W REFLEX MICROSCOPIC - Abnormal; Notable for the following components:   Protein, ur TRACE (*)  All other components within normal limits    EKG None  Radiology CT ABDOMEN PELVIS W CONTRAST  Result Date: 10/28/2022 CLINICAL DATA:  Abdominal pain, acute, nonlocalized.  Lower abd pain EXAM: CT ABDOMEN AND PELVIS WITH CONTRAST TECHNIQUE: Multidetector CT imaging of the abdomen and pelvis was performed using the standard protocol following bolus administration of intravenous contrast. RADIATION DOSE REDUCTION: This exam was performed according to the departmental dose-optimization program which includes automated exposure control, adjustment of the mA and/or kV according to patient size and/or use of iterative reconstruction technique. CONTRAST:  OMNIPAQUE IOHEXOL 300 MG/ML  SOLN COMPARISON:  12/28/2020 FINDINGS: Lower chest: No acute abnormality. Hepatobiliary: No focal liver abnormality is seen. No gallstones, gallbladder wall thickening, or biliary dilatation. Pancreas: Unremarkable Spleen: Unremarkable Adrenals/Urinary Tract: Adrenal glands are unremarkable. Kidneys are normal, without renal calculi, focal lesion, or hydronephrosis. Bladder is unremarkable. Stomach/Bowel: Moderate to severe pancolonic diverticulosis without superimposed acute inflammatory change. The stomach, small bowel, and large bowel are otherwise unremarkable. Appendix normal. No free intraperitoneal gas or  fluid. Vascular/Lymphatic: Aortic atherosclerosis. No enlarged abdominal or pelvic lymph nodes. Reproductive: There is interval development of subtle periprostatic inflammatory changes noted, best appreciated on axial image # 66/2 surrounding the seminal vesicles which may reflect changes of acute infectious or inflammatory prostatitis. No intraparenchymal abscess or periprostatic fluid collection identified. Other: Interval right inguinal hernia repair with mesh. No recurrent abdominal wall hernia. Musculoskeletal: No acute bone abnormality. Degenerative changes are seen within the lumbar spine. No lytic or blastic bone lesion. IMPRESSION: 1. Interval development of subtle periprostatic inflammatory changes surrounding the seminal vesicles which may reflect changes of acute infectious or inflammatory prostatitis. No intraparenchymal abscess or periprostatic fluid collection identified. 2. Moderate to severe pancolonic diverticulosis without superimposed acute inflammatory change. 3. Interval right inguinal hernia repair with mesh. No recurrent abdominal wall hernia. 4. Aortic atherosclerosis. Aortic Atherosclerosis (ICD10-I70.0). Electronically Signed   By: Helyn Numbers M.D.   On: 10/28/2022 20:30    Procedures Procedures    Medications Ordered in ED Medications  morphine (PF) 4 MG/ML injection 4 mg (4 mg Intravenous Given 10/28/22 2023)  ondansetron (ZOFRAN) injection 4 mg (4 mg Intravenous Given 10/28/22 2025)  iohexol (OMNIPAQUE) 300 MG/ML solution 100 mL (100 mLs Intravenous Contrast Given 10/28/22 2003)    ED Course/ Medical Decision Making/ A&P    56 year old history of A-fib on chronic anticoagulation, CHF, hypertension, obesity here for evaluation of abdominal pain which began approximately 1 PM today.  No recent trauma or injuries.  No changes in bowel movements.  No melena or blood per rectum.  No urinary symptoms.  No chest pain or shortness of breath.  Exam limited due to body habitus  however does have some tenderness to his right mid and lower quadrant.  No overlying skin changes to suggest cellulitis or abscess.  No obvious hernias.  Hx of CHF however he does not appear grossly fluid overloaded on exam.  Will plan on treating his pain, labs, imaging and reassess  Labs and imaging personally viewed and interpreted:  CBC without leukocytosis, hemoglobin 12.7, similar to prior Metabolic panel creatinine 1.26 Lipase 90 UA negative for infection, blood CT abdomen and pelvis inflammatory changes around the prostate representing possible inflammatory versus infectious prostatitis.  Prior inguinal hernia repair with mesh  Patient reassessed.  Discussed labs and imaging with patient, family in room.  Pain improved, tolerating p.o. intake.  Discussed his imaging showing possible prostatitis.  Has had prostate issues in the past.  Urine does not appear grossly infectious.  Will treat given he has had recent symptoms.  With regards to his right-sided abdominal pain he has no obvious hernia on exam.  No overlying infectious process to upper facial tissues, no fluctuance induration to suggest abscess.  Given this occurred when he went from laying down to standing up question whether this is more MSK in etiology however again difficult due to body habitus.  Will treat symptomatically.  He is on home pain medicine.  Will have him follow-up outpatient, return for new or worsening symptoms.  At this time a low suspicion for acute appendicitis, cholecystitis, choledocholithiasis, cholangitis, pancreatitis, bowel obstruction, perforation, intussusception, AAA, dissection, infected kidney stone, torsion.  The patient has been appropriately medically screened and/or stabilized in the ED. I have low suspicion for any other emergent medical condition which would require further screening, evaluation or treatment in the ED or require inpatient management.  Patient is hemodynamically stable and in no  acute distress.  Patient able to ambulate in department prior to ED.  Evaluation does not show acute pathology that would require ongoing or additional emergent interventions while in the emergency department or further inpatient treatment.  I have discussed the diagnosis with the patient and answered all questions.  Pain is been managed while in the emergency department and patient has no further complaints prior to discharge.  Patient is comfortable with plan discussed in room and is stable for discharge at this time.  I have discussed strict return precautions for returning to the emergency department.  Patient was encouraged to follow-up with PCP/specialist refer to at discharge.                            Medical Decision Making Amount and/or Complexity of Data Reviewed Independent Historian: spouse External Data Reviewed: labs, radiology and notes. Labs: ordered. Decision-making details documented in ED Course. Radiology: ordered and independent interpretation performed. Decision-making details documented in ED Course.  Risk OTC drugs. Prescription drug management. Parenteral controlled substances. Decision regarding hospitalization. Diagnosis or treatment significantly limited by social determinants of health.          Final Clinical Impression(s) / ED Diagnoses Final diagnoses:  Lower abdominal pain  Inflammatory disease of prostate    Rx / DC Orders ED Discharge Orders          Ordered    ciprofloxacin (CIPRO) 500 MG tablet  Every 12 hours        10/28/22 2119    dicyclomine (BENTYL) 20 MG tablet  2 times daily        10/28/22 2119              Thelbert Gartin A, PA-C 10/28/22 2121    Ernie Avena, MD 10/28/22 2323

## 2022-10-28 NOTE — Discharge Instructions (Addendum)
Your workup today was reassuring.  We have started antibiotics for possible infection.  We have also given you medication which is antispasm medication.  Continue taking your home pain medicine  Follow-up with your primary care provider in the next few days  Return for new or worsening symptoms

## 2022-11-09 DIAGNOSIS — E782 Mixed hyperlipidemia: Secondary | ICD-10-CM | POA: Diagnosis not present

## 2022-11-09 DIAGNOSIS — I10 Essential (primary) hypertension: Secondary | ICD-10-CM | POA: Diagnosis not present

## 2022-11-09 DIAGNOSIS — F419 Anxiety disorder, unspecified: Secondary | ICD-10-CM | POA: Diagnosis not present

## 2022-11-09 DIAGNOSIS — R69 Illness, unspecified: Secondary | ICD-10-CM | POA: Diagnosis not present

## 2022-11-09 DIAGNOSIS — J449 Chronic obstructive pulmonary disease, unspecified: Secondary | ICD-10-CM | POA: Diagnosis not present

## 2022-11-09 DIAGNOSIS — R7989 Other specified abnormal findings of blood chemistry: Secondary | ICD-10-CM | POA: Diagnosis not present

## 2022-11-09 DIAGNOSIS — E1165 Type 2 diabetes mellitus with hyperglycemia: Secondary | ICD-10-CM | POA: Diagnosis not present

## 2022-11-09 DIAGNOSIS — I5032 Chronic diastolic (congestive) heart failure: Secondary | ICD-10-CM | POA: Diagnosis not present

## 2022-11-09 DIAGNOSIS — E559 Vitamin D deficiency, unspecified: Secondary | ICD-10-CM | POA: Diagnosis not present

## 2022-11-09 DIAGNOSIS — G4489 Other headache syndrome: Secondary | ICD-10-CM | POA: Diagnosis not present

## 2022-11-20 DIAGNOSIS — G4733 Obstructive sleep apnea (adult) (pediatric): Secondary | ICD-10-CM | POA: Diagnosis not present

## 2023-01-21 ENCOUNTER — Other Ambulatory Visit: Payer: Self-pay | Admitting: Cardiology

## 2023-03-04 DIAGNOSIS — E1165 Type 2 diabetes mellitus with hyperglycemia: Secondary | ICD-10-CM | POA: Diagnosis not present

## 2023-03-04 DIAGNOSIS — N289 Disorder of kidney and ureter, unspecified: Secondary | ICD-10-CM | POA: Diagnosis not present

## 2023-03-04 DIAGNOSIS — J449 Chronic obstructive pulmonary disease, unspecified: Secondary | ICD-10-CM | POA: Diagnosis not present

## 2023-03-04 DIAGNOSIS — E782 Mixed hyperlipidemia: Secondary | ICD-10-CM | POA: Diagnosis not present

## 2023-03-04 DIAGNOSIS — G4489 Other headache syndrome: Secondary | ICD-10-CM | POA: Diagnosis not present

## 2023-03-04 DIAGNOSIS — Z125 Encounter for screening for malignant neoplasm of prostate: Secondary | ICD-10-CM | POA: Diagnosis not present

## 2023-03-04 DIAGNOSIS — F419 Anxiety disorder, unspecified: Secondary | ICD-10-CM | POA: Diagnosis not present

## 2023-03-04 DIAGNOSIS — I10 Essential (primary) hypertension: Secondary | ICD-10-CM | POA: Diagnosis not present

## 2023-03-04 DIAGNOSIS — J302 Other seasonal allergic rhinitis: Secondary | ICD-10-CM | POA: Diagnosis not present

## 2023-03-04 DIAGNOSIS — Z0000 Encounter for general adult medical examination without abnormal findings: Secondary | ICD-10-CM | POA: Diagnosis not present

## 2023-03-04 DIAGNOSIS — I119 Hypertensive heart disease without heart failure: Secondary | ICD-10-CM | POA: Diagnosis not present

## 2023-03-04 DIAGNOSIS — Z1329 Encounter for screening for other suspected endocrine disorder: Secondary | ICD-10-CM | POA: Diagnosis not present

## 2023-03-04 DIAGNOSIS — E559 Vitamin D deficiency, unspecified: Secondary | ICD-10-CM | POA: Diagnosis not present

## 2023-03-18 DIAGNOSIS — J449 Chronic obstructive pulmonary disease, unspecified: Secondary | ICD-10-CM | POA: Diagnosis not present

## 2023-03-18 DIAGNOSIS — E1165 Type 2 diabetes mellitus with hyperglycemia: Secondary | ICD-10-CM | POA: Diagnosis not present

## 2023-03-18 DIAGNOSIS — I119 Hypertensive heart disease without heart failure: Secondary | ICD-10-CM | POA: Diagnosis not present

## 2023-03-18 DIAGNOSIS — E559 Vitamin D deficiency, unspecified: Secondary | ICD-10-CM | POA: Diagnosis not present

## 2023-03-18 DIAGNOSIS — E782 Mixed hyperlipidemia: Secondary | ICD-10-CM | POA: Diagnosis not present

## 2023-03-18 DIAGNOSIS — N289 Disorder of kidney and ureter, unspecified: Secondary | ICD-10-CM | POA: Diagnosis not present

## 2023-03-18 DIAGNOSIS — G4489 Other headache syndrome: Secondary | ICD-10-CM | POA: Diagnosis not present

## 2023-03-18 DIAGNOSIS — I10 Essential (primary) hypertension: Secondary | ICD-10-CM | POA: Diagnosis not present

## 2023-03-18 DIAGNOSIS — J302 Other seasonal allergic rhinitis: Secondary | ICD-10-CM | POA: Diagnosis not present

## 2023-03-20 DIAGNOSIS — G4733 Obstructive sleep apnea (adult) (pediatric): Secondary | ICD-10-CM | POA: Diagnosis not present

## 2023-03-28 ENCOUNTER — Encounter: Payer: Self-pay | Admitting: Internal Medicine

## 2023-03-28 ENCOUNTER — Ambulatory Visit: Payer: Medicare HMO

## 2023-03-28 ENCOUNTER — Ambulatory Visit: Payer: Medicare HMO | Admitting: Internal Medicine

## 2023-03-28 VITALS — BP 139/71 | HR 52 | Ht 69.0 in | Wt 289.0 lb

## 2023-03-28 DIAGNOSIS — I5032 Chronic diastolic (congestive) heart failure: Secondary | ICD-10-CM

## 2023-03-28 DIAGNOSIS — I480 Paroxysmal atrial fibrillation: Secondary | ICD-10-CM

## 2023-03-28 DIAGNOSIS — I10 Essential (primary) hypertension: Secondary | ICD-10-CM

## 2023-03-28 NOTE — Progress Notes (Signed)
Patient is here for follow up visit.  Subjective:   Juan Snyder, male    DOB: 1965-12-15, 57 y.o.   MRN: [id number]   Chief Complaint  Patient presents with   Atrial Fibrillation   Follow-up   57 year old African-American male with nonischemic cardiomyopathy, 07/2018, paroxysmal atrial fibrillation/flutter status post successful cardioversion 08/13/2018,  not a candidate for ablation due severe LAA enlargement, hypertension, type II diabetes mellitus, morbid obesity, obstructive sleep apnea on CPAP.  Patient presents today for follow-up visit. He has lost about 30 pounds since the last time he was here. He does not want to be on so many medications but we discussed the importance of medication compliance and continued weight loss. If he continues to lose weight and his blood pressure becomes low he will make a sooner appointment to discuss medication reconciliation at that time. We discussed that he cannot stop any of his current medications at this time. He denies chest pain, shortness of breath, palpitations, leg edema, orthopnea, PND, TIA/syncope.   Current Outpatient Medications:    albuterol (PROVENTIL HFA;VENTOLIN HFA) 108 (90 Base) MCG/ACT inhaler, Inhale 1 puff into the lungs every 6 (six) hours as needed for wheezing or shortness of breath., Disp: , Rfl:    amLODipine (NORVASC) 10 MG tablet, Take 10 mg by mouth daily., Disp: , Rfl:    ciprofloxacin (CIPRO) 500 MG tablet, Take 1 tablet (500 mg total) by mouth every 12 (twelve) hours., Disp: 10 tablet, Rfl: 0   dicyclomine (BENTYL) 20 MG tablet, Take 1 tablet (20 mg total) by mouth 2 (two) times daily., Disp: 20 tablet, Rfl: 0   ENTRESTO 97-103 MG, TAKE 1 TABLET BY MOUTH TWICE A DAY (Patient taking differently: Take 1 tablet by mouth 2 (two) times daily.), Disp: 60 tablet, Rfl: 3   fluticasone (FLONASE) 50 MCG/ACT nasal spray, Place 1 spray into both nostrils daily as needed for allergies. , Disp: , Rfl:    furosemide (LASIX) 40  MG tablet, Take 1 tablet (40 mg total) by mouth 2 (two) times daily. One tablet in morning, Additional one tablet in the afternoon for leg swelling, weight gain,as needed (Patient taking differently: Take 40 mg by mouth 2 (two) times daily. Additional 1 tablet in the morning if needed.), Disp: 120 tablet, Rfl: 1   magnesium oxide (MAG-OX) 400 MG tablet, Take 200 mg by mouth 2 (two) times daily., Disp: , Rfl:    metFORMIN (GLUCOPHAGE) 500 MG tablet, TAKE 1 TABLET BY MOUTH 2 TIMES DAILY WITH A MEAL., Disp: 180 tablet, Rfl: 1   methocarbamol (ROBAXIN) 750 MG tablet, Take 750 mg by mouth every 8 (eight) hours as needed for muscle spasms., Disp: , Rfl:    metoprolol succinate (TOPROL-XL) 100 MG 24 hr tablet, Take 1 tablet (100 mg total) by mouth daily. Take with or immediately following a meal., Disp: 30 tablet, Rfl: 0   Multiple Vitamins-Minerals (MULTIVITAMIN PO), Take 1 tablet by mouth daily., Disp: , Rfl:    oxyCODONE-acetaminophen (PERCOCET) 10-325 MG tablet, Take 1 tablet by mouth every 4 (four) hours as needed for pain., Disp: , Rfl:    Potassium Chloride ER 20 MEQ TBCR, TAKE 1/2 TABLET BY MOUTH EVERY DAY, Disp: 45 tablet, Rfl: 3   sildenafil (REVATIO) 20 MG tablet, Take 1 tablet by mouth as needed., Disp: , Rfl:    SYMBICORT 160-4.5 MCG/ACT inhaler, Inhale 1 puff into the lungs daily., Disp: , Rfl:    tamsulosin (FLOMAX) 0.4 MG CAPS capsule, Take 0.4 mg  by mouth daily. , Disp: , Rfl:    TRULICITY 1.5 MG/0.5ML SOPN, Inject 3 mg into the skin once a week., Disp: , Rfl:    XARELTO 20 MG TABS tablet, TAKE 1 TABLET BY MOUTH EVERY DAY, Disp: 30 tablet, Rfl: 11  Cardiovascular studies:  Echocardiogram 11/01/2021:  Normal LV systolic function with visual EF 55-60%. Left ventricle cavity  is normal in size. Mild left ventricular hypertrophy. Normal global wall  motion. Doppler evidence of grade III (restrictive) diastolic dysfunction,  elevated LAP.  Left atrial cavity is moderately dilated.  Mild  (Grade I) mitral regurgitation.  Mild tricuspid regurgitation.  Mild pulmonic regurgitation.  Compared to study 05/05/2021 LVEF improved from 40-45% to 55-60%, G3DD is  new, severe LAE is now moderate, mild TR/PR are new.   EKG 09/27/2022: Normal sinus rhythm at rate of 63 bpm.  Single PAC.  Normal axis.  Diffuse nonspecific T wave abnormality.  Compared to previous EKG on 09/03/2029, sinus rhythm replaces atrial fibrillation.  EKG 09/03/2022: Atrial fibrillation with controlled ventricular response at 85 bpm.  Normal axis.  Single PVC.  Nonspecific T wave abnormality.  Compared to previous EKG from 10/23/2021, atrial fibrillation has replaced sinus rhythm.  03/28/2023: Atrial fibrillation with CVR, rate 56 bpm. Nonspecific T-abnormality.  Echocardiogram 05/05/2020:  Left ventricle cavity is normal in size. Moderate concentric hypertrophy  of the left ventricle. Mild global hypokinesis. LVEF 40-45%. Diastolic  function indeterminate. Elevated LAP.  Left atrial cavity is severely dilated.  Moderate (Grade II) mitral regurgitation.  Normal right atrial pressure.  Compared to previous study on 04/21/2019, pulmonary hypertension now absent   R&LHC 08/12/2018: Left dominant circulation with no significant CAD Mildly elevated LVEDP (17 mmHg) Moderate WHO Grp II PH (Mean PA 33 mmHg) Mildly decompensated arrhythmia induced nonischemic cardiomyopathy. Continue guideline directed heart failure therapy, and management of Afib.  Recent labs: 03/07/2022: Cholesterol 177, triglycerides 84, HDL 59, LDL 101 Glucose 129, BUN 17, creatinine 1.26, EGFR 67 Sodium 139, potassium 4.3 Hemoglobin 13.9, hematocrit 39.1, platelets 243 Hemoglobin A1c 7.3% TSH 0.14  03/23/2021: Glucose 142, BUN/Cr 17/1.2. EGFR ?. Na/K 138/5.1.  H/H 13/39.  12/28/2020: Glucose 146, BUN/Cr 16/1.17. EGFR >60. Na/K 137/4.4. T. Bili 1.3. Rest of the CMP normal H/H 13/40. MCV 83. Platelets 249  Results for Juan, Snyder (MRN  [id number]) as of 06/20/2020 12:49  Ref. Range 06/07/2020 23:23 06/08/2020 04:27 06/09/2020 08:57  B Natriuretic Peptide Latest Ref Range: 0.0 - 100.0 pg/mL 831.9 (H) 996.6 (H) 690.3 (H)    Review of Systems  Cardiovascular:  Negative for chest pain, leg swelling, palpitations and syncope.  Respiratory:  Negative for shortness of breath.   Neurological:  Negative for dizziness and light-headedness.   Objective:   Vitals:   03/28/23 1114  BP: 139/71  Pulse: (!) 52  SpO2: 97%       Physical Exam Vitals and nursing note reviewed.  Constitutional:      General: He is not in acute distress.    Appearance: He is obese.  Neck:     Vascular: No JVD.  Cardiovascular:     Rate and Rhythm: Bradycardia present. Rhythm irregular.     Pulses: Normal pulses.     Heart sounds: Normal heart sounds. No murmur heard. Pulmonary:     Effort: Pulmonary effort is normal.     Breath sounds: Normal breath sounds. No wheezing or rales.  Musculoskeletal:     Right lower leg: No edema.     Left lower leg:  No edema.      ICD-10-CM   1. Paroxysmal A-fib (HCC)  I48.0 EKG 12-Lead    2. Essential hypertension, benign  I10     3. Chronic heart failure with preserved ejection fraction (HCC)  I50.32       CHA2DS2-VASc Score is 3.  Yearly risk of stroke: 3.2% (3.2).  Score of 1=0.6; 2=2.2; 3=3.2; 4=4.8; 5=7.2; 6=9.8; 7=>9.8) -(CHF; HTN; vasc disease DM,  Male = 1; Age <65 =0; 65-74 = 1,  >75 =2; stroke/embolism= 2).   There are no discontinued medications.    Assessment & Recommendations:   57 year old African-American male with nonischemic cardiomyopathy, 07/2018, paroxysmal atrial fibrillation/flutter, hypertension, type II diabetes mellitus, morbid obesity, obstructive sleep apnea on CPAP.  Paroxysmal Afib/flutter: He is in rate controlled Afib, he does not have palpitations He continues on metoprolol succiante 100 mg daily. CHA2DS2VASc score 3, annual stroke risk 3.2% He is tolerating  Xarelto without any bleeding diathesis. Continue management of OSA with CPAP, weight loss.  Primary hypertension: Patient wants to get off some of his meds. I discussed the importance of compliance and if he continues to lose weight and BP is lower we can then discuss stopping some BP meds. For now, he is to continue on current regiment and not stop anything without coming to the office with direct instructions from [place]. Advised low-sodium diet less than 2000 mg daily.    HFpEF: EF 40-45% (04/2020), now recovered to 55-60% (10/2021) with grade III DD. Currently on Entresto to 97-103 mg bid,  metoprolol succinate 100 mg daily. No orthopnea, PND, or edema Continue lasix 40 mg 1-2 tab/day.  Follow-up in 6 months or sooner if needed.   [facility] [place], [place] Office: [phone]

## 2023-03-29 DIAGNOSIS — N401 Enlarged prostate with lower urinary tract symptoms: Secondary | ICD-10-CM | POA: Diagnosis not present

## 2023-04-03 DIAGNOSIS — S91331A Puncture wound without foreign body, right foot, initial encounter: Secondary | ICD-10-CM | POA: Diagnosis not present

## 2023-04-03 DIAGNOSIS — E119 Type 2 diabetes mellitus without complications: Secondary | ICD-10-CM | POA: Diagnosis not present

## 2023-04-03 DIAGNOSIS — L089 Local infection of the skin and subcutaneous tissue, unspecified: Secondary | ICD-10-CM | POA: Diagnosis not present

## 2023-04-03 DIAGNOSIS — M79671 Pain in right foot: Secondary | ICD-10-CM | POA: Diagnosis not present

## 2023-04-20 DIAGNOSIS — G4733 Obstructive sleep apnea (adult) (pediatric): Secondary | ICD-10-CM | POA: Diagnosis not present

## 2023-04-25 DIAGNOSIS — N5201 Erectile dysfunction due to arterial insufficiency: Secondary | ICD-10-CM | POA: Diagnosis not present

## 2023-04-25 DIAGNOSIS — R3912 Poor urinary stream: Secondary | ICD-10-CM | POA: Diagnosis not present

## 2023-04-25 DIAGNOSIS — N401 Enlarged prostate with lower urinary tract symptoms: Secondary | ICD-10-CM | POA: Diagnosis not present

## 2023-05-20 DIAGNOSIS — G4733 Obstructive sleep apnea (adult) (pediatric): Secondary | ICD-10-CM | POA: Diagnosis not present

## 2023-06-12 ENCOUNTER — Ambulatory Visit: Payer: Medicare HMO | Admitting: Podiatry

## 2023-06-17 DIAGNOSIS — F5104 Psychophysiologic insomnia: Secondary | ICD-10-CM | POA: Diagnosis not present

## 2023-06-17 DIAGNOSIS — E559 Vitamin D deficiency, unspecified: Secondary | ICD-10-CM | POA: Diagnosis not present

## 2023-06-17 DIAGNOSIS — I119 Hypertensive heart disease without heart failure: Secondary | ICD-10-CM | POA: Diagnosis not present

## 2023-06-17 DIAGNOSIS — E1165 Type 2 diabetes mellitus with hyperglycemia: Secondary | ICD-10-CM | POA: Diagnosis not present

## 2023-06-17 DIAGNOSIS — J449 Chronic obstructive pulmonary disease, unspecified: Secondary | ICD-10-CM | POA: Diagnosis not present

## 2023-06-17 DIAGNOSIS — J302 Other seasonal allergic rhinitis: Secondary | ICD-10-CM | POA: Diagnosis not present

## 2023-06-17 DIAGNOSIS — G4489 Other headache syndrome: Secondary | ICD-10-CM | POA: Diagnosis not present

## 2023-06-17 DIAGNOSIS — E782 Mixed hyperlipidemia: Secondary | ICD-10-CM | POA: Diagnosis not present

## 2023-06-17 DIAGNOSIS — I10 Essential (primary) hypertension: Secondary | ICD-10-CM | POA: Diagnosis not present

## 2023-06-17 DIAGNOSIS — N289 Disorder of kidney and ureter, unspecified: Secondary | ICD-10-CM | POA: Diagnosis not present

## 2023-07-05 DIAGNOSIS — G4733 Obstructive sleep apnea (adult) (pediatric): Secondary | ICD-10-CM | POA: Diagnosis not present

## 2023-08-05 DIAGNOSIS — G4733 Obstructive sleep apnea (adult) (pediatric): Secondary | ICD-10-CM | POA: Diagnosis not present

## 2023-08-19 ENCOUNTER — Encounter: Payer: Self-pay | Admitting: Podiatry

## 2023-08-19 ENCOUNTER — Ambulatory Visit: Payer: Medicare HMO | Admitting: Podiatry

## 2023-08-19 DIAGNOSIS — E119 Type 2 diabetes mellitus without complications: Secondary | ICD-10-CM | POA: Diagnosis not present

## 2023-08-19 NOTE — Progress Notes (Signed)
Subjective:  Patient ID: CLARY EDEL, male    DOB: 1966-03-05,   MRN: [id number]  Chief Complaint  Patient presents with   Diabetes    Pt presents for diabetic foot exam.     57 y.o. male presents for diabetic foot exam. Denies any pain in his feet. Denies burning or tingling. Is in pain management for his hip and his back but no issues with feet.  Last A1c was 6.7 Denies any other pedal complaints. Denies n/v/f/c.   PCP: Jackie Plum MD   Past Medical History:  Diagnosis Date   Back pain, chronic    CHF (congestive heart failure) (HCC)    Diabetes mellitus    Dyspnea    Hypertension    Nonischemic cardiomyopathy (HCC)    Obesity    PAF (paroxysmal atrial fibrillation) (HCC)    Sleep apnea    uses CPAP    Objective:  Physical Exam: Vascular: DP/PT pulses 2/4 bilateral. CFT <3 seconds. Normal hair growth on digits. No edema.  Skin. No lacerations or abrasions bilateral feet. Nails 1-5 bilateral normal in appearance.  Musculoskeletal: MMT 5/5 bilateral lower extremities in DF, PF, Inversion and Eversion. Deceased ROM in DF of ankle joint.  Neurological: Sensation intact to light touch. Protective sensation intact.   Assessment:   1. Type 2 diabetes mellitus without complication, unspecified whether long term insulin use (HCC)       Plan:  Patient was evaluated and treated and all questions answered. -Discussed and educated patient on diabetic foot care, especially with  regards to the vascular, neurological and musculoskeletal systems.  -Stressed the importance of good glycemic control and the detriment of not  controlling glucose levels in relation to the foot. -Discussed supportive shoes at all times and checking feet regularly.  -Answered all patient questions -Patient to return in 1 year for diabetic foot exam.  -Patient advised to call the office if any problems or questions arise in the meantime.   Louann Sjogren, DPM

## 2023-08-26 DIAGNOSIS — H2513 Age-related nuclear cataract, bilateral: Secondary | ICD-10-CM | POA: Diagnosis not present

## 2023-08-26 DIAGNOSIS — H52223 Regular astigmatism, bilateral: Secondary | ICD-10-CM | POA: Diagnosis not present

## 2023-08-26 DIAGNOSIS — Z135 Encounter for screening for eye and ear disorders: Secondary | ICD-10-CM | POA: Diagnosis not present

## 2023-08-26 DIAGNOSIS — H5203 Hypermetropia, bilateral: Secondary | ICD-10-CM | POA: Diagnosis not present

## 2023-08-26 DIAGNOSIS — H524 Presbyopia: Secondary | ICD-10-CM | POA: Diagnosis not present

## 2023-08-26 DIAGNOSIS — E119 Type 2 diabetes mellitus without complications: Secondary | ICD-10-CM | POA: Diagnosis not present

## 2023-09-04 DIAGNOSIS — G4733 Obstructive sleep apnea (adult) (pediatric): Secondary | ICD-10-CM | POA: Diagnosis not present

## 2023-09-16 DIAGNOSIS — E559 Vitamin D deficiency, unspecified: Secondary | ICD-10-CM | POA: Diagnosis not present

## 2023-09-16 DIAGNOSIS — E782 Mixed hyperlipidemia: Secondary | ICD-10-CM | POA: Diagnosis not present

## 2023-09-16 DIAGNOSIS — J449 Chronic obstructive pulmonary disease, unspecified: Secondary | ICD-10-CM | POA: Diagnosis not present

## 2023-09-16 DIAGNOSIS — I119 Hypertensive heart disease without heart failure: Secondary | ICD-10-CM | POA: Diagnosis not present

## 2023-09-16 DIAGNOSIS — F5104 Psychophysiologic insomnia: Secondary | ICD-10-CM | POA: Diagnosis not present

## 2023-09-16 DIAGNOSIS — G4489 Other headache syndrome: Secondary | ICD-10-CM | POA: Diagnosis not present

## 2023-09-16 DIAGNOSIS — I10 Essential (primary) hypertension: Secondary | ICD-10-CM | POA: Diagnosis not present

## 2023-09-16 DIAGNOSIS — E1165 Type 2 diabetes mellitus with hyperglycemia: Secondary | ICD-10-CM | POA: Diagnosis not present

## 2023-10-02 ENCOUNTER — Ambulatory Visit: Payer: Self-pay | Admitting: Cardiology

## 2023-10-03 DIAGNOSIS — G4733 Obstructive sleep apnea (adult) (pediatric): Secondary | ICD-10-CM | POA: Diagnosis not present

## 2023-10-11 ENCOUNTER — Encounter: Payer: Self-pay | Admitting: Cardiology

## 2023-10-11 ENCOUNTER — Ambulatory Visit: Payer: Medicare HMO | Attending: Cardiology | Admitting: Cardiology

## 2023-10-11 VITALS — BP 122/80 | HR 73 | Ht 68.0 in | Wt 285.2 lb

## 2023-10-11 DIAGNOSIS — I480 Paroxysmal atrial fibrillation: Secondary | ICD-10-CM

## 2023-10-11 DIAGNOSIS — I5032 Chronic diastolic (congestive) heart failure: Secondary | ICD-10-CM | POA: Diagnosis not present

## 2023-10-11 DIAGNOSIS — I10 Essential (primary) hypertension: Secondary | ICD-10-CM | POA: Diagnosis not present

## 2023-10-11 NOTE — Patient Instructions (Signed)
Medication Instructions:   *If you need a refill on your cardiac medications before your next appointment, please call your pharmacy*   Lab Work: Bmet and hgb at your PCP   If you have labs (blood work) drawn today and your tests are completely normal, you will receive your results only by: MyChart Message (if you have MyChart) OR A paper copy in the mail If you have any lab test that is abnormal or we need to change your treatment, we will call you to review the results.   Testing/Procedures:    Follow-Up: At [facility], you and your health needs are our priority.  As part of our continuing mission to provide you with exceptional heart care, we have created designated Provider Care Teams.  These Care Teams include your primary Cardiologist (physician) and Advanced Practice Providers (APPs -  Physician Assistants and Nurse Practitioners) who all work together to provide you with the care you need, when you need it.  We recommend signing up for the patient portal called "MyChart".  Sign up information is provided on this After Visit Summary.  MyChart is used to connect with patients for Virtual Visits (Telemedicine).  Patients are able to view lab/test results, encounter notes, upcoming appointments, etc.  Non-urgent messages can be sent to your provider as well.   To learn more about what you can do with MyChart, go to [email or web address].    Your next appointment:  one year

## 2023-10-11 NOTE — Progress Notes (Signed)
Cardiology Office Note:  .   Date:  10/11/2023  ID:  [name], DOB 1966-03-03, MRN [id number] PCP: Jackie Plum, MD  [facility] Providers Cardiologist:  Truett Mainland, MD PCP: Jackie Plum, MD  Chief Complaint  Patient presents with   Chronic heart failure with preserved ejection fraction [facility])   Follow-up    6 month      History of Present Illness: .    Juan Snyder is a 57 y.o. male with hypertension, type II DM, paroxysmal A-fib/flutter, HFpEF, obesity, OSA on CPAP   Patient is doing well.  He denies any new cardiac symptoms.  He has mild, unchanged baseline exertional dyspnea.  Denies any orthopnea, PND, negative symptoms.  He has been losing weight on Ozempic.  His biggest complaint currently is back pain for which she is seeing provider.  He has upcoming labs with PCP next week.   Vitals:   10/11/23 0914  BP: 122/80  Pulse: 73  SpO2: 96%     ROS:  Review of Systems  Cardiovascular:  Positive for dyspnea on exertion (Mild, stable). Negative for chest pain, leg swelling, palpitations and syncope.     Studies Reviewed: Marland Kitchen        Independently interpreted EKG 03/28/2023: Atrial fibrillation 56 bpm Nonspecific T wave abnormality  Independently interpreted Labs 10/2022: Cr 1.26, eGFR >60  Echocardiogram 11/01/2021:  Normal LV systolic function with visual EF 55-60%. Left ventricle cavity  is normal in size. Mild left ventricular hypertrophy. Normal global wall  motion. Doppler evidence of grade III (restrictive) diastolic dysfunction,  elevated LAP.  Left atrial cavity is moderately dilated.  Mild (Grade I) mitral regurgitation.  Mild tricuspid regurgitation.  Mild pulmonic regurgitation.  Compared to study 05/05/2021 LVEF improved from 40-45% to 55-60%, G3DD is  new, severe LAE is now moderate, mild TR/PR are new.   Risk Assessment/Calculations:    CHA2DS2-VASc Score = 3   This indicates a  3.2% annual risk of stroke.        Physical Exam:   Physical Exam Vitals and nursing note reviewed.  Constitutional:      General: He is not in acute distress. Neck:     Vascular: No JVD.  Cardiovascular:     Rate and Rhythm: Normal rate and regular rhythm.     Heart sounds: Normal heart sounds. No murmur heard. Pulmonary:     Effort: Pulmonary effort is normal.     Breath sounds: Normal breath sounds. No wheezing or rales.  Musculoskeletal:     Right lower leg: No edema.     Left lower leg: No edema.      VISIT DIAGNOSES:   ICD-10-CM   1. Chronic heart failure with preserved ejection fraction (HCC)  I50.32 EKG 12-Lead    2. Paroxysmal A-fib (HCC)  I48.0     3. Essential hypertension, benign  I10        ASSESSMENT AND PLAN: .    Juan Snyder is a 57 y.o. male with hypertension, type II DM, paroxysmal A-fib/flutter, HFpEF, obesity, OSA on CPAP   Nonscehmic cardiomyopathy: EF 40-45%.(04/2020), recovered to 55-60% (10/2021) with grade III DD Clinically, appears euvolemic today. Continue Entresto to 97-103 mg bid,  metoprolol succinate 100 mg daily. Did not tolerate spironolactone due to gynacomastia Continue lasix 40 mg 1-2 tab/day Given that he is fairly euvolemic and controlled on current GDMT, okay not to add SGLT2i.   Paroxysmal Afib/flutter: Patient on physical exam, appears to be in sinus  rhythm today. Continue metoprolol succiante 100 mg daily. CHA2DS2VASc score 3, annual stroke risk 3.6% Continue Xarelto 20 mg daily. Continue management of OSA with CPAP, weight loss.   Hypertension: Controlled  OSA: Severe, on CPAP.    Type II DM: Managed by PCP.   Mixed hyperlipidemia: Upcoming lipid panel with PCP next week.  Given his diabetes, he should be on a statin.  He is not willing to start at this time, but wants to research into rosuvastatin. He will call back next week, if he would like a prescription for the same.   F/u in 1 year  Signed, Elder Negus, MD

## 2023-10-21 ENCOUNTER — Other Ambulatory Visit: Payer: Medicare HMO

## 2023-11-02 DIAGNOSIS — G4733 Obstructive sleep apnea (adult) (pediatric): Secondary | ICD-10-CM | POA: Diagnosis not present

## 2023-12-03 DIAGNOSIS — G4733 Obstructive sleep apnea (adult) (pediatric): Secondary | ICD-10-CM | POA: Diagnosis not present

## 2024-02-17 DIAGNOSIS — G4489 Other headache syndrome: Secondary | ICD-10-CM | POA: Diagnosis not present

## 2024-02-17 DIAGNOSIS — E782 Mixed hyperlipidemia: Secondary | ICD-10-CM | POA: Diagnosis not present

## 2024-02-17 DIAGNOSIS — I10 Essential (primary) hypertension: Secondary | ICD-10-CM | POA: Diagnosis not present

## 2024-02-17 DIAGNOSIS — E559 Vitamin D deficiency, unspecified: Secondary | ICD-10-CM | POA: Diagnosis not present

## 2024-02-17 DIAGNOSIS — E1165 Type 2 diabetes mellitus with hyperglycemia: Secondary | ICD-10-CM | POA: Diagnosis not present

## 2024-02-17 DIAGNOSIS — I119 Hypertensive heart disease without heart failure: Secondary | ICD-10-CM | POA: Diagnosis not present

## 2024-02-17 DIAGNOSIS — J449 Chronic obstructive pulmonary disease, unspecified: Secondary | ICD-10-CM | POA: Diagnosis not present

## 2024-02-17 DIAGNOSIS — F5104 Psychophysiologic insomnia: Secondary | ICD-10-CM | POA: Diagnosis not present

## 2024-04-10 DIAGNOSIS — G4733 Obstructive sleep apnea (adult) (pediatric): Secondary | ICD-10-CM | POA: Diagnosis not present

## 2024-06-01 DIAGNOSIS — I119 Hypertensive heart disease without heart failure: Secondary | ICD-10-CM | POA: Diagnosis not present

## 2024-06-01 DIAGNOSIS — G4489 Other headache syndrome: Secondary | ICD-10-CM | POA: Diagnosis not present

## 2024-06-01 DIAGNOSIS — J449 Chronic obstructive pulmonary disease, unspecified: Secondary | ICD-10-CM | POA: Diagnosis not present

## 2024-06-01 DIAGNOSIS — E782 Mixed hyperlipidemia: Secondary | ICD-10-CM | POA: Diagnosis not present

## 2024-06-01 DIAGNOSIS — E559 Vitamin D deficiency, unspecified: Secondary | ICD-10-CM | POA: Diagnosis not present

## 2024-06-01 DIAGNOSIS — F5104 Psychophysiologic insomnia: Secondary | ICD-10-CM | POA: Diagnosis not present

## 2024-06-01 DIAGNOSIS — Z0000 Encounter for general adult medical examination without abnormal findings: Secondary | ICD-10-CM | POA: Diagnosis not present

## 2024-06-01 DIAGNOSIS — Z125 Encounter for screening for malignant neoplasm of prostate: Secondary | ICD-10-CM | POA: Diagnosis not present

## 2024-06-01 DIAGNOSIS — E1165 Type 2 diabetes mellitus with hyperglycemia: Secondary | ICD-10-CM | POA: Diagnosis not present

## 2024-06-01 DIAGNOSIS — I8390 Asymptomatic varicose veins of unspecified lower extremity: Secondary | ICD-10-CM | POA: Diagnosis not present

## 2024-06-01 DIAGNOSIS — I10 Essential (primary) hypertension: Secondary | ICD-10-CM | POA: Diagnosis not present

## 2024-07-13 DIAGNOSIS — I10 Essential (primary) hypertension: Secondary | ICD-10-CM | POA: Diagnosis not present

## 2024-07-13 DIAGNOSIS — F5104 Psychophysiologic insomnia: Secondary | ICD-10-CM | POA: Diagnosis not present

## 2024-07-13 DIAGNOSIS — M25512 Pain in left shoulder: Secondary | ICD-10-CM | POA: Diagnosis not present

## 2024-07-13 DIAGNOSIS — I119 Hypertensive heart disease without heart failure: Secondary | ICD-10-CM | POA: Diagnosis not present

## 2024-07-13 DIAGNOSIS — G4489 Other headache syndrome: Secondary | ICD-10-CM | POA: Diagnosis not present

## 2024-07-13 DIAGNOSIS — E782 Mixed hyperlipidemia: Secondary | ICD-10-CM | POA: Diagnosis not present

## 2024-07-13 DIAGNOSIS — J449 Chronic obstructive pulmonary disease, unspecified: Secondary | ICD-10-CM | POA: Diagnosis not present

## 2024-07-13 DIAGNOSIS — E1165 Type 2 diabetes mellitus with hyperglycemia: Secondary | ICD-10-CM | POA: Diagnosis not present

## 2024-07-13 DIAGNOSIS — E559 Vitamin D deficiency, unspecified: Secondary | ICD-10-CM | POA: Diagnosis not present

## 2024-07-13 DIAGNOSIS — I8390 Asymptomatic varicose veins of unspecified lower extremity: Secondary | ICD-10-CM | POA: Diagnosis not present

## 2024-07-22 DIAGNOSIS — G4733 Obstructive sleep apnea (adult) (pediatric): Secondary | ICD-10-CM | POA: Diagnosis not present

## 2024-08-18 ENCOUNTER — Encounter: Payer: Self-pay | Admitting: Podiatry

## 2024-08-18 ENCOUNTER — Ambulatory Visit (INDEPENDENT_AMBULATORY_CARE_PROVIDER_SITE_OTHER): Payer: Medicare HMO | Admitting: Podiatry

## 2024-08-18 DIAGNOSIS — M109 Gout, unspecified: Secondary | ICD-10-CM | POA: Diagnosis not present

## 2024-08-18 DIAGNOSIS — E119 Type 2 diabetes mellitus without complications: Secondary | ICD-10-CM

## 2024-08-18 MED ORDER — COLCHICINE 0.6 MG PO TABS: 0.6000 mg | ORAL_TABLET | Freq: Every day | ORAL | 2 refills | Status: DC

## 2024-08-18 NOTE — Progress Notes (Signed)
  Subjective:  Patient ID: Juan Snyder, male    DOB: [birth year],   MRN: [id number]  Chief Complaint  Patient presents with   Diabetes    This is my yearly check-up.  Dr. Zachary Ose-Bonsu - 07/11/2024; A1c - 6.3   PHQ-9 8 Week Follow-up     58 y.o. male presents for diabetic foot exam. Denies any pain in his feet. Denies burning or tingling. Is in pain management for his hip and his back but no issues with feet. Relates some gout flares this past year and has been using colchicine to help hoping to get a refill.   Last A1c was 6.3 Denies any other pedal complaints. Denies n/v/f/c.   PCP: Zachary Conger MD   Past Medical History:  Diagnosis Date   Back pain, chronic    CHF (congestive heart failure) (HCC)    Diabetes mellitus    Dyspnea    Hypertension    Nonischemic cardiomyopathy (HCC)    Obesity    PAF (paroxysmal atrial fibrillation) (HCC)    Sleep apnea    uses CPAP    Objective:  Physical Exam: Vascular: DP/PT pulses 2/4 bilateral. CFT <3 seconds. Normal hair growth on digits. No edema.  Skin. No lacerations or abrasions bilateral feet. Nails 1-5 bilateral normal in appearance.  Musculoskeletal: MMT 5/5 bilateral lower extremities in DF, PF, Inversion and Eversion. Deceased ROM in DF of ankle joint.  Neurological: Sensation intact to light touch. Protective sensation intact.   Assessment:   1. Type 2 diabetes mellitus without complication, unspecified whether long term insulin  use   2. Encounter for diabetic foot exam (HCC)        Plan:  Patient was evaluated and treated and all questions answered. -Discussed and educated patient on diabetic foot care, especially with  regards to the vascular, neurological and musculoskeletal systems.  -Stressed the importance of good glycemic control and the detriment of not  controlling glucose levels in relation to the foot. -Discussed supportive shoes at all times and checking feet regularly.  -Colchicine refill  sent to help with gout flares.  -Answered all patient questions -Patient to return in 1 year for diabetic foot exam.  -Patient advised to call the office if any problems or questions arise in the meantime.   Asberry Failing, DPM

## 2024-10-05 DIAGNOSIS — I10 Essential (primary) hypertension: Secondary | ICD-10-CM | POA: Diagnosis not present

## 2024-10-05 DIAGNOSIS — G4489 Other headache syndrome: Secondary | ICD-10-CM | POA: Diagnosis not present

## 2024-10-05 DIAGNOSIS — J449 Chronic obstructive pulmonary disease, unspecified: Secondary | ICD-10-CM | POA: Diagnosis not present

## 2024-10-05 DIAGNOSIS — F5104 Psychophysiologic insomnia: Secondary | ICD-10-CM | POA: Diagnosis not present

## 2024-10-05 DIAGNOSIS — E1165 Type 2 diabetes mellitus with hyperglycemia: Secondary | ICD-10-CM | POA: Diagnosis not present

## 2024-10-05 DIAGNOSIS — I119 Hypertensive heart disease without heart failure: Secondary | ICD-10-CM | POA: Diagnosis not present

## 2024-10-05 DIAGNOSIS — Z0001 Encounter for general adult medical examination with abnormal findings: Secondary | ICD-10-CM | POA: Diagnosis not present

## 2024-10-05 DIAGNOSIS — E782 Mixed hyperlipidemia: Secondary | ICD-10-CM | POA: Diagnosis not present

## 2024-10-05 DIAGNOSIS — E559 Vitamin D deficiency, unspecified: Secondary | ICD-10-CM | POA: Diagnosis not present

## 2024-10-05 DIAGNOSIS — I8390 Asymptomatic varicose veins of unspecified lower extremity: Secondary | ICD-10-CM | POA: Diagnosis not present

## 2024-10-30 DIAGNOSIS — G4733 Obstructive sleep apnea (adult) (pediatric): Secondary | ICD-10-CM | POA: Diagnosis not present

## 2024-11-23 ENCOUNTER — Other Ambulatory Visit: Payer: Self-pay | Admitting: Lab

## 2024-11-23 MED ORDER — COLCHICINE 0.6 MG PO TABS: 0.6000 mg | ORAL_TABLET | Freq: Every day | ORAL | 2 refills | Status: DC

## 2024-11-24 ENCOUNTER — Other Ambulatory Visit: Payer: Self-pay | Admitting: Lab

## 2024-11-24 ENCOUNTER — Encounter: Payer: Self-pay | Admitting: Lab

## 2024-11-24 MED ORDER — COLCHICINE 0.6 MG PO TABS: 0.6000 mg | ORAL_TABLET | Freq: Every day | ORAL | 2 refills | Status: AC

## 2024-11-24 MED ORDER — COLCHICINE 0.6 MG PO TABS: 0.6000 mg | ORAL_TABLET | Freq: Every day | ORAL | 2 refills | Status: DC

## 2025-08-18 ENCOUNTER — Ambulatory Visit: Admitting: Podiatry
